# Patient Record
Sex: Female | Born: 1950
Health system: Southern US, Community
[De-identification: ages and names within clinical notes are randomized; demographics above are authoritative.]

## PROBLEM LIST (undated history)

## (undated) DIAGNOSIS — M1990 Unspecified osteoarthritis, unspecified site: Secondary | ICD-10-CM

## (undated) DIAGNOSIS — F419 Anxiety disorder, unspecified: Secondary | ICD-10-CM

## (undated) DIAGNOSIS — E039 Hypothyroidism, unspecified: Secondary | ICD-10-CM

## (undated) DIAGNOSIS — K219 Gastro-esophageal reflux disease without esophagitis: Secondary | ICD-10-CM

## (undated) DIAGNOSIS — H269 Unspecified cataract: Secondary | ICD-10-CM

## (undated) DIAGNOSIS — E785 Hyperlipidemia, unspecified: Secondary | ICD-10-CM

## (undated) DIAGNOSIS — T7840XA Allergy, unspecified, initial encounter: Secondary | ICD-10-CM

## (undated) DIAGNOSIS — I10 Essential (primary) hypertension: Secondary | ICD-10-CM

## (undated) DIAGNOSIS — C4492 Squamous cell carcinoma of skin, unspecified: Secondary | ICD-10-CM

## (undated) DIAGNOSIS — C4491 Basal cell carcinoma of skin, unspecified: Secondary | ICD-10-CM

## (undated) HISTORY — DX: Allergy, unspecified, initial encounter: T78.40XA

## (undated) HISTORY — DX: Gastro-esophageal reflux disease without esophagitis: K21.9

## (undated) HISTORY — PX: TUBAL LIGATION: SHX77

## (undated) HISTORY — PX: EYE SURGERY: SHX253

## (undated) HISTORY — PX: KNEE ARTHROSCOPY: SUR90

## (undated) HISTORY — DX: Hypothyroidism, unspecified: E03.9

## (undated) HISTORY — PX: OTHER SURGICAL HISTORY: SHX169

## (undated) HISTORY — PX: ABDOMINAL HYSTERECTOMY: SHX81

## (undated) HISTORY — DX: Hyperlipidemia, unspecified: E78.5

## (undated) HISTORY — PX: TONSILLECTOMY: SUR1361

## (undated) HISTORY — PX: COLONOSCOPY: SHX174

## (undated) HISTORY — DX: Anxiety disorder, unspecified: F41.9

## (undated) HISTORY — DX: Unspecified osteoarthritis, unspecified site: M19.90

## (undated) HISTORY — PX: PARTIAL HYSTERECTOMY: SHX80

## (undated) HISTORY — DX: Unspecified cataract: H26.9

## (undated) HISTORY — PX: POLYPECTOMY: SHX149

---
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
## 1994-10-14 DIAGNOSIS — C4491 Basal cell carcinoma of skin, unspecified: Secondary | ICD-10-CM

## 1994-10-14 HISTORY — DX: Basal cell carcinoma of skin, unspecified: C44.91

## 1999-04-30 ENCOUNTER — Encounter: Payer: Self-pay | Admitting: Obstetrics and Gynecology

## 1999-04-30 ENCOUNTER — Encounter: Admission: RE | Admit: 1999-04-30 | Discharge: 1999-04-30 | Payer: Self-pay | Admitting: Obstetrics and Gynecology

## 1999-07-25 ENCOUNTER — Other Ambulatory Visit: Admission: RE | Admit: 1999-07-25 | Discharge: 1999-07-25 | Payer: Self-pay | Admitting: Obstetrics and Gynecology

## 2000-06-09 ENCOUNTER — Encounter: Admission: RE | Admit: 2000-06-09 | Discharge: 2000-06-09 | Payer: Self-pay | Admitting: Obstetrics and Gynecology

## 2000-06-09 ENCOUNTER — Encounter: Payer: Self-pay | Admitting: Obstetrics and Gynecology

## 2000-10-25 ENCOUNTER — Other Ambulatory Visit: Admission: RE | Admit: 2000-10-25 | Discharge: 2000-10-25 | Payer: Self-pay | Admitting: Obstetrics and Gynecology

## 2001-06-17 ENCOUNTER — Encounter: Admission: RE | Admit: 2001-06-17 | Discharge: 2001-06-17 | Payer: Self-pay | Admitting: Obstetrics and Gynecology

## 2001-06-17 ENCOUNTER — Encounter: Payer: Self-pay | Admitting: Obstetrics and Gynecology

## 2002-09-20 ENCOUNTER — Encounter: Payer: Self-pay | Admitting: Obstetrics and Gynecology

## 2002-09-20 ENCOUNTER — Encounter: Admission: RE | Admit: 2002-09-20 | Discharge: 2002-09-20 | Payer: Self-pay | Admitting: Obstetrics and Gynecology

## 2003-04-03 ENCOUNTER — Encounter: Admission: RE | Admit: 2003-04-03 | Discharge: 2003-04-03 | Payer: Self-pay | Admitting: Family Medicine

## 2003-10-10 ENCOUNTER — Ambulatory Visit (HOSPITAL_COMMUNITY): Admission: RE | Admit: 2003-10-10 | Discharge: 2003-10-10 | Payer: Self-pay | Admitting: Obstetrics and Gynecology

## 2004-10-10 ENCOUNTER — Ambulatory Visit (HOSPITAL_COMMUNITY): Admission: RE | Admit: 2004-10-10 | Discharge: 2004-10-10 | Payer: Self-pay | Admitting: Family Medicine

## 2004-10-13 ENCOUNTER — Encounter: Admission: RE | Admit: 2004-10-13 | Discharge: 2004-10-13 | Payer: Self-pay | Admitting: Family Medicine

## 2005-12-01 ENCOUNTER — Ambulatory Visit (HOSPITAL_COMMUNITY): Admission: RE | Admit: 2005-12-01 | Discharge: 2005-12-01 | Payer: Self-pay | Admitting: Obstetrics & Gynecology

## 2007-02-23 ENCOUNTER — Ambulatory Visit (HOSPITAL_COMMUNITY): Admission: RE | Admit: 2007-02-23 | Discharge: 2007-02-23 | Payer: Self-pay | Admitting: Family Medicine

## 2007-08-18 ENCOUNTER — Ambulatory Visit: Payer: Self-pay | Admitting: Gastroenterology

## 2007-09-01 ENCOUNTER — Ambulatory Visit: Payer: Self-pay | Admitting: Gastroenterology

## 2007-09-01 ENCOUNTER — Encounter: Payer: Self-pay | Admitting: Gastroenterology

## 2008-04-12 ENCOUNTER — Ambulatory Visit (HOSPITAL_COMMUNITY): Admission: RE | Admit: 2008-04-12 | Discharge: 2008-04-12 | Payer: Self-pay | Admitting: Obstetrics and Gynecology

## 2009-02-15 ENCOUNTER — Encounter: Admission: RE | Admit: 2009-02-15 | Discharge: 2009-02-15 | Payer: Self-pay | Admitting: Emergency Medicine

## 2009-04-29 ENCOUNTER — Ambulatory Visit (HOSPITAL_COMMUNITY): Admission: RE | Admit: 2009-04-29 | Discharge: 2009-04-29 | Payer: Self-pay | Admitting: Family Medicine

## 2010-06-24 ENCOUNTER — Ambulatory Visit (HOSPITAL_COMMUNITY)
Admission: RE | Admit: 2010-06-24 | Discharge: 2010-06-24 | Payer: Self-pay | Source: Home / Self Care | Attending: Obstetrics and Gynecology | Admitting: Obstetrics and Gynecology

## 2010-06-28 ENCOUNTER — Encounter: Payer: Self-pay | Admitting: Obstetrics and Gynecology

## 2010-06-29 ENCOUNTER — Encounter: Payer: Self-pay | Admitting: Family Medicine

## 2010-07-30 DIAGNOSIS — R079 Chest pain, unspecified: Secondary | ICD-10-CM

## 2011-06-23 ENCOUNTER — Other Ambulatory Visit (HOSPITAL_COMMUNITY): Payer: Self-pay | Admitting: Obstetrics and Gynecology

## 2011-06-23 DIAGNOSIS — Z1231 Encounter for screening mammogram for malignant neoplasm of breast: Secondary | ICD-10-CM

## 2011-07-22 ENCOUNTER — Ambulatory Visit (HOSPITAL_COMMUNITY): Payer: Self-pay

## 2011-07-28 ENCOUNTER — Ambulatory Visit (HOSPITAL_COMMUNITY)
Admission: RE | Admit: 2011-07-28 | Discharge: 2011-07-28 | Disposition: A | Discharge status: Home / Self Care | Payer: BC Managed Care – PPO | Source: Ambulatory Visit | Attending: Obstetrics and Gynecology | Admitting: Obstetrics and Gynecology

## 2011-07-28 DIAGNOSIS — Z1231 Encounter for screening mammogram for malignant neoplasm of breast: Secondary | ICD-10-CM | POA: Insufficient documentation

## 2011-12-07 ENCOUNTER — Other Ambulatory Visit: Payer: Self-pay | Admitting: Dermatology

## 2011-12-07 DIAGNOSIS — C4492 Squamous cell carcinoma of skin, unspecified: Secondary | ICD-10-CM

## 2011-12-07 HISTORY — DX: Squamous cell carcinoma of skin, unspecified: C44.92

## 2012-09-01 ENCOUNTER — Other Ambulatory Visit: Payer: Self-pay

## 2012-09-21 ENCOUNTER — Encounter: Payer: Self-pay | Admitting: Gastroenterology

## 2012-10-05 ENCOUNTER — Other Ambulatory Visit (HOSPITAL_COMMUNITY): Payer: Self-pay | Admitting: Obstetrics and Gynecology

## 2012-10-05 DIAGNOSIS — Z1231 Encounter for screening mammogram for malignant neoplasm of breast: Secondary | ICD-10-CM

## 2012-10-06 ENCOUNTER — Ambulatory Visit (HOSPITAL_COMMUNITY)
Admission: RE | Admit: 2012-10-06 | Discharge: 2012-10-06 | Disposition: A | Discharge status: Home / Self Care | Payer: BC Managed Care – PPO | Source: Ambulatory Visit | Attending: Obstetrics and Gynecology | Admitting: Obstetrics and Gynecology

## 2012-10-06 DIAGNOSIS — Z1231 Encounter for screening mammogram for malignant neoplasm of breast: Secondary | ICD-10-CM | POA: Insufficient documentation

## 2013-03-30 ENCOUNTER — Encounter: Payer: Self-pay | Admitting: Gastroenterology

## 2013-06-15 ENCOUNTER — Encounter: Payer: BC Managed Care – PPO | Admitting: Gastroenterology

## 2013-08-11 ENCOUNTER — Ambulatory Visit (AMBULATORY_SURGERY_CENTER): Payer: Self-pay

## 2013-08-11 VITALS — Ht 62.0 in | Wt 180.0 lb

## 2013-08-11 DIAGNOSIS — Z860100 Personal history of colon polyps, unspecified: Secondary | ICD-10-CM

## 2013-08-11 MED ORDER — MOVIPREP 100 G PO SOLR: 1.0000 | Freq: Once | ORAL | Status: DC

## 2013-08-24 ENCOUNTER — Encounter: Payer: Self-pay | Admitting: Gastroenterology

## 2013-08-25 ENCOUNTER — Encounter: Payer: BC Managed Care – PPO | Admitting: Gastroenterology

## 2013-09-06 ENCOUNTER — Other Ambulatory Visit (HOSPITAL_COMMUNITY): Payer: Self-pay | Admitting: Obstetrics and Gynecology

## 2013-09-06 DIAGNOSIS — Z1231 Encounter for screening mammogram for malignant neoplasm of breast: Secondary | ICD-10-CM

## 2013-10-04 ENCOUNTER — Encounter: Payer: BC Managed Care – PPO | Admitting: Gastroenterology

## 2013-10-09 ENCOUNTER — Ambulatory Visit (HOSPITAL_COMMUNITY)
Admission: RE | Admit: 2013-10-09 | Discharge: 2013-10-09 | Disposition: A | Discharge status: Home / Self Care | Payer: BC Managed Care – PPO | Source: Ambulatory Visit | Attending: Obstetrics and Gynecology | Admitting: Obstetrics and Gynecology

## 2013-10-09 DIAGNOSIS — Z1231 Encounter for screening mammogram for malignant neoplasm of breast: Secondary | ICD-10-CM | POA: Insufficient documentation

## 2013-10-20 ENCOUNTER — Ambulatory Visit (AMBULATORY_SURGERY_CENTER): Payer: BC Managed Care – PPO | Admitting: Gastroenterology

## 2013-10-20 ENCOUNTER — Encounter: Payer: Self-pay | Admitting: Gastroenterology

## 2013-10-20 VITALS — BP 144/97 | HR 69 | Temp 97.3°F | Resp 22 | Ht 62.0 in | Wt 180.0 lb

## 2013-10-20 DIAGNOSIS — K5730 Diverticulosis of large intestine without perforation or abscess without bleeding: Secondary | ICD-10-CM

## 2013-10-20 DIAGNOSIS — K648 Other hemorrhoids: Secondary | ICD-10-CM

## 2013-10-20 DIAGNOSIS — Z86010 Personal history of colonic polyps: Secondary | ICD-10-CM

## 2013-10-20 DIAGNOSIS — Z800 Family history of malignant neoplasm of digestive organs: Secondary | ICD-10-CM

## 2013-10-20 MED ORDER — SODIUM CHLORIDE 0.9 % IV SOLN: 500.0000 mL | INTRAVENOUS | Status: DC

## 2013-10-20 NOTE — Op Note (Signed)
[place]  [address] [place], [id number]   COLONOSCOPY PROCEDURE REPORT  PATIENT: Lynn [name], Lynn [name]  MR#: [id number] BIRTHDATE: [birth year] , 63  yrs. old GENDER: Female ENDOSCOPIST: Inda Castle, MD REFERRED [id number] Redmond Pulling, MD PROCEDURE DATE:  10/20/2013 PROCEDURE:   Colonoscopy, diagnostic First Screening Colonoscopy - Avg.  risk and is 50 yrs.  old or older - No.  Prior Negative Screening - Now for repeat screening. N/A  History of Adenoma - Now for follow-up colonoscopy & has been > or = to 3 yrs.  Yes hx of adenoma.  Has been 3 or more years since last colonoscopy.  Polyps Removed Today? No.  Recommend repeat exam, <10 yrs? Yes.  High risk (family or personal hx). ASA CLASS:   Class II INDICATIONS:Patient's immediate family history of colon cancer and Patient's personal history of colon polyps. 2009 MEDICATIONS: MAC sedation, administered by CRNA and Propofol (Diprivan) 220 mg IV  DESCRIPTION OF PROCEDURE:   After the risks benefits and alternatives of the procedure were thoroughly explained, informed consent was obtained.  A digital rectal exam revealed no abnormalities of the rectum.   The LB [id number]  endoscope was introduced through the anus and advanced to the cecum, which was identified by both the appendix and ileocecal valve. No adverse events experienced.   The quality of the prep was excellent using Suprep  The instrument was then slowly withdrawn as the colon was fully examined.      COLON FINDINGS: Moderate diverticulosis was noted in the sigmoid colon.   Internal hemorrhoids were found.   The colon was otherwise normal.  There was no diverticulosis, inflammation, polyps or cancers unless previously stated.  Retroflexed views revealed no abnormalities. The time to cecum=minutes 0 seconds.  Withdrawal time=6 minutes 55 seconds.  The scope was withdrawn and the procedure completed. COMPLICATIONS: There were no  complications.  ENDOSCOPIC IMPRESSION: 1.   Moderate diverticulosis was noted in the sigmoid colon 2.   Internal hemorrhoids 3.   The colon was otherwise normal  RECOMMENDATIONS: Given your significant family history of colon cancer, you should have a repeat colonoscopy in 5 years   eSigned:  Inda Castle, MD 10/20/2013 1:37 PM   cc:   PATIENT NAME:  Lynn [name], Lynn [name] MR#: [id number]

## 2013-10-20 NOTE — Patient Instructions (Signed)
YOU HAD AN ENDOSCOPIC PROCEDURE TODAY AT [facility]: Refer to the procedure report that was given to you for any specific questions about what was found during the examination.  If the procedure report does not answer your questions, please call your gastroenterologist to clarify.  If you requested that your care partner not be given the details of your procedure findings, then the procedure report has been included in a sealed envelope for you to review at your convenience later.  YOU SHOULD EXPECT: Some feelings of bloating in the abdomen. Passage of more gas than usual.  Walking can help get rid of the air that was put into your GI tract during the procedure and reduce the bloating. If you had a lower endoscopy (such as a colonoscopy or flexible sigmoidoscopy) you may notice spotting of blood in your stool or on the toilet paper. If you underwent a bowel prep for your procedure, then you may not have a normal bowel movement for a few days.  DIET: Your first meal following the procedure should be a light meal and then it is ok to progress to your normal diet.  A half-sandwich or bowl of soup is an example of a good first meal.  Heavy or fried foods are harder to digest and may make you feel nauseous or bloated.  Likewise meals heavy in dairy and vegetables can cause extra gas to form and this can also increase the bloating.  Drink plenty of fluids but you should avoid alcoholic beverages for 24 hours.  ACTIVITY: Your care partner should take you home directly after the procedure.  You should plan to take it easy, moving slowly for the rest of the day.  You can resume normal activity the day after the procedure however you should NOT DRIVE or use heavy machinery for 24 hours (because of the sedation medicines used during the test).    SYMPTOMS TO REPORT IMMEDIATELY: A gastroenterologist can be reached at any hour.  During normal business hours, 8:30 AM to 5:00 PM Monday through Friday,  call [phone].  After hours and on weekends, please call the GI answering service at [phone] who will take a message and have the physician on call contact you.   Following lower endoscopy (colonoscopy or flexible sigmoidoscopy):  Excessive amounts of blood in the stool  Significant tenderness or worsening of abdominal pains  Swelling of the abdomen that is new, acute  Fever of 100F or higher \ FOLLOW UP: If any biopsies were taken you will be contacted by phone or by letter within the next 1-3 weeks.  Call your gastroenterologist if you have not heard about the biopsies in 3 weeks.  Our staff will call the home number listed on your records the next business day following your procedure to check on you and address any questions or concerns that you may have at that time regarding the information given to you following your procedure. This is a courtesy call and so if there is no answer at the home number and we have not heard from you through the emergency physician on call, we will assume that you have returned to your regular daily activities without incident.  SIGNATURES/CONFIDENTIALITY: You and/or your care partner have signed paperwork which will be entered into your electronic medical record.  These signatures attest to the fact that that the information above on your After Visit Summary has been reviewed and is understood.  Full responsibility of the confidentiality of this  discharge information lies with you and/or your care-partner.  Hemorrhoid and diverticulosis information given.  Repeat colonoscopy in 5 years.

## 2013-10-20 NOTE — Progress Notes (Signed)
Pt stable to RR 

## 2013-10-23 ENCOUNTER — Telehealth: Payer: Self-pay | Admitting: *Deleted

## 2013-10-23 NOTE — Telephone Encounter (Signed)
  Follow up Call-  Call back number 10/20/2013  Post procedure Call Back phone  # [phone]  Permission to leave phone message Yes     Patient questions:  Message left to call us if necessary.

## 2014-09-19 ENCOUNTER — Other Ambulatory Visit (HOSPITAL_COMMUNITY): Payer: Self-pay | Admitting: Obstetrics and Gynecology

## 2014-09-19 DIAGNOSIS — Z1231 Encounter for screening mammogram for malignant neoplasm of breast: Secondary | ICD-10-CM

## 2014-10-11 ENCOUNTER — Ambulatory Visit (HOSPITAL_COMMUNITY)
Admission: RE | Admit: 2014-10-11 | Discharge: 2014-10-11 | Disposition: A | Discharge status: Home / Self Care | Payer: BLUE CROSS/BLUE SHIELD | Source: Ambulatory Visit | Attending: Obstetrics and Gynecology | Admitting: Obstetrics and Gynecology

## 2014-10-11 DIAGNOSIS — Z1231 Encounter for screening mammogram for malignant neoplasm of breast: Secondary | ICD-10-CM | POA: Diagnosis not present

## 2015-02-28 ENCOUNTER — Other Ambulatory Visit: Payer: Self-pay | Admitting: Unknown Physician Specialty

## 2015-02-28 DIAGNOSIS — B999 Unspecified infectious disease: Secondary | ICD-10-CM

## 2015-03-06 ENCOUNTER — Ambulatory Visit
Admission: RE | Admit: 2015-03-06 | Discharge: 2015-03-06 | Disposition: A | Discharge status: Home / Self Care | Payer: BLUE CROSS/BLUE SHIELD | Source: Ambulatory Visit | Attending: Unknown Physician Specialty | Admitting: Unknown Physician Specialty

## 2015-03-06 DIAGNOSIS — B999 Unspecified infectious disease: Secondary | ICD-10-CM

## 2015-04-16 LAB — BASIC METABOLIC PANEL
BUN: 10 mg/dL (ref 4–21)
CREATININE: 0.9 mg/dL (ref <=1.1)
GLUCOSE: 87 mg/dL
Potassium: 3.3 mmol/L — AB (ref 3.4–5.3)
SODIUM: 145 mmol/L (ref 137–147)

## 2015-04-16 LAB — CBC AND DIFFERENTIAL
HCT: 41 % (ref 36–46)
Hemoglobin: 14 g/dL (ref 12.0–16.0)
NEUTROS ABS: 3 /uL
PLATELETS: 179 10*3/uL (ref 150–399)
WBC: 4.9 10^3/mL

## 2015-04-16 LAB — TSH: TSH: 1.98 u[IU]/mL (ref <=5.90)

## 2015-04-16 LAB — LIPID PANEL
Cholesterol: 175 mg/dL (ref 0–200)
HDL: 50 mg/dL (ref 35–70)
LDL CALC: 27 mg/dL
TRIGLYCERIDES: 137 mg/dL (ref 40–160)

## 2015-04-16 LAB — HEPATIC FUNCTION PANEL
ALK PHOS: 60 U/L (ref 25–125)
AST: 21 U/L (ref 13–35)

## 2015-04-23 LAB — HM DEXA SCAN: HM DEXA SCAN: NORMAL

## 2015-04-26 LAB — HEPATIC FUNCTION PANEL: ALT: 16 U/L (ref 7–35)

## 2015-08-14 DIAGNOSIS — I8312 Varicose veins of left lower extremity with inflammation: Secondary | ICD-10-CM | POA: Diagnosis not present

## 2015-08-14 DIAGNOSIS — I8311 Varicose veins of right lower extremity with inflammation: Secondary | ICD-10-CM | POA: Diagnosis not present

## 2015-09-02 ENCOUNTER — Other Ambulatory Visit: Payer: Self-pay

## 2015-09-02 DIAGNOSIS — Z1231 Encounter for screening mammogram for malignant neoplasm of breast: Secondary | ICD-10-CM

## 2015-09-03 DIAGNOSIS — L719 Rosacea, unspecified: Secondary | ICD-10-CM | POA: Diagnosis not present

## 2015-09-03 DIAGNOSIS — D239 Other benign neoplasm of skin, unspecified: Secondary | ICD-10-CM | POA: Diagnosis not present

## 2015-09-03 DIAGNOSIS — B079 Viral wart, unspecified: Secondary | ICD-10-CM | POA: Diagnosis not present

## 2015-09-09 DIAGNOSIS — I8312 Varicose veins of left lower extremity with inflammation: Secondary | ICD-10-CM | POA: Diagnosis not present

## 2015-09-09 DIAGNOSIS — I8311 Varicose veins of right lower extremity with inflammation: Secondary | ICD-10-CM | POA: Diagnosis not present

## 2015-09-25 DIAGNOSIS — I8312 Varicose veins of left lower extremity with inflammation: Secondary | ICD-10-CM | POA: Diagnosis not present

## 2015-09-25 DIAGNOSIS — I8311 Varicose veins of right lower extremity with inflammation: Secondary | ICD-10-CM | POA: Diagnosis not present

## 2015-10-14 ENCOUNTER — Ambulatory Visit
Admission: RE | Admit: 2015-10-14 | Discharge: 2015-10-14 | Disposition: A | Discharge status: Home / Self Care | Payer: Medicare Other | Source: Ambulatory Visit

## 2015-10-14 DIAGNOSIS — Z1231 Encounter for screening mammogram for malignant neoplasm of breast: Secondary | ICD-10-CM

## 2015-10-18 ENCOUNTER — Encounter: Payer: Self-pay | Admitting: General Practice

## 2015-10-24 ENCOUNTER — Ambulatory Visit (INDEPENDENT_AMBULATORY_CARE_PROVIDER_SITE_OTHER): Payer: Medicare Other | Admitting: Family Medicine

## 2015-10-24 ENCOUNTER — Encounter: Payer: Self-pay | Admitting: Family Medicine

## 2015-10-24 VITALS — BP 124/80 | HR 86 | Temp 98.0°F | Resp 16 | Ht 62.0 in | Wt 196.1 lb

## 2015-10-24 DIAGNOSIS — E038 Other specified hypothyroidism: Secondary | ICD-10-CM

## 2015-10-24 DIAGNOSIS — Z23 Encounter for immunization: Secondary | ICD-10-CM | POA: Diagnosis not present

## 2015-10-24 DIAGNOSIS — E785 Hyperlipidemia, unspecified: Secondary | ICD-10-CM | POA: Diagnosis not present

## 2015-10-24 DIAGNOSIS — E039 Hypothyroidism, unspecified: Secondary | ICD-10-CM | POA: Insufficient documentation

## 2015-10-24 DIAGNOSIS — E669 Obesity, unspecified: Secondary | ICD-10-CM | POA: Diagnosis not present

## 2015-10-24 LAB — CBC WITH DIFFERENTIAL/PLATELET
BASOS ABS: 0 10*3/uL (ref 0.0–0.1)
BASOS PCT: 0.8 % (ref 0.0–3.0)
EOS ABS: 0.1 10*3/uL (ref 0.0–0.7)
Eosinophils Relative: 1.9 % (ref 0.0–5.0)
HCT: 45.4 % (ref 36.0–46.0)
HEMOGLOBIN: 15.3 g/dL — AB (ref 12.0–15.0)
Lymphocytes Relative: 27.5 % (ref 12.0–46.0)
Lymphs Abs: 1.8 10*3/uL (ref 0.7–4.0)
MCHC: 33.6 g/dL (ref 30.0–36.0)
MCV: 85.8 fl (ref 78.0–100.0)
MONO ABS: 0.5 10*3/uL (ref 0.1–1.0)
Monocytes Relative: 8.3 % (ref 3.0–12.0)
Neutro Abs: 4.1 10*3/uL (ref 1.4–7.7)
Neutrophils Relative %: 61.5 % (ref 43.0–77.0)
Platelets: 200 10*3/uL (ref 150.0–400.0)
RBC: 5.3 Mil/uL — ABNORMAL HIGH (ref 3.87–5.11)
RDW: 12.6 % (ref 11.5–15.5)
WBC: 6.6 10*3/uL (ref 4.0–10.5)

## 2015-10-24 LAB — BASIC METABOLIC PANEL
BUN: 14 mg/dL (ref 6–23)
CHLORIDE: 104 meq/L (ref 96–112)
CO2: 31 meq/L (ref 19–32)
CREATININE: 1.03 mg/dL (ref 0.40–1.20)
Calcium: 9.2 mg/dL (ref 8.4–10.5)
GFR: 57.11 mL/min — ABNORMAL LOW (ref >60.00)
GLUCOSE: 85 mg/dL (ref 70–99)
Potassium: 3.6 mEq/L (ref 3.5–5.1)
Sodium: 144 mEq/L (ref 135–145)

## 2015-10-24 LAB — LIPID PANEL
Cholesterol: 198 mg/dL (ref 0–200)
HDL: 46.1 mg/dL (ref >39.00)
LDL Cholesterol: 119 mg/dL — ABNORMAL HIGH (ref 0–99)
NonHDL: 152.38
TRIGLYCERIDES: 166 mg/dL — AB (ref 0.0–149.0)
Total CHOL/HDL Ratio: 4
VLDL: 33.2 mg/dL (ref 0.0–40.0)

## 2015-10-24 LAB — HEPATIC FUNCTION PANEL
ALBUMIN: 4.5 g/dL (ref 3.5–5.2)
ALT: 17 U/L (ref 0–35)
AST: 19 U/L (ref 0–37)
Alkaline Phosphatase: 69 U/L (ref 39–117)
Bilirubin, Direct: 0.2 mg/dL (ref 0.0–0.3)
TOTAL PROTEIN: 6.7 g/dL (ref 6.0–8.3)
Total Bilirubin: 1 mg/dL (ref 0.2–1.2)

## 2015-10-24 LAB — TSH: TSH: 1.94 u[IU]/mL (ref 0.35–4.50)

## 2015-10-24 NOTE — Assessment & Plan Note (Signed)
New to provider, ongoing for pt.  Tolerating statin w/o difficulty.  Check labs.  Adjust meds prn  

## 2015-10-24 NOTE — Assessment & Plan Note (Signed)
New to provider, ongoing for pt.  She is now working on [employer] and regular exercise now that she has retired.  Applauded these efforts.  Check labs to risk stratify.  Will follow.

## 2015-10-24 NOTE — Progress Notes (Signed)
Pre visit review using our clinic review tool, if applicable. No additional management support is needed unless otherwise documented below in the visit note. 

## 2015-10-24 NOTE — Assessment & Plan Note (Signed)
New to provider, ongoing for pt.  She has some sxs- such as fatigue- that could be attributed to thyroid.  Check TSH level and adjust dose prn.  Pt expressed understanding and is in agreement w/ plan.

## 2015-10-24 NOTE — Patient Instructions (Signed)
Schedule your complete physical in 6 months We'll notify you of your lab results and make any changes if needed Continue to work on healthy diet and regular exercise- you can do it! Call with any questions or concerns Welcome!  We're glad to have you! Have a great summer!!!

## 2015-10-24 NOTE — Progress Notes (Signed)
   Subjective:    Patient ID: Lynn [name], female    DOB: [birth year], 65 y.o.   MRN: [id number]  HPI New to establish.  Previous MD- Dianna Rossetti on site providers.  UTD on colonoscopy, UTD on mammo, no need for pap.  Has never had Prevnar  Hyperlipidemia- chronic problem, on Simvastatin.  No CP, SOB, HAs, visual changes, abd pain, N/V, myalgias.  Hypothyroid- chronic problem, on Levothyroxine.  Some ongoing fatigue.  No changes to skin/hair/nails.  Denies constipation or diarrhea.  Obesity- ongoing issue for pt, BMI is now 35.87.  Pt just started exercising and has changed diet habits.   Review of Systems For ROS see HPI     Objective:   Physical Exam  Constitutional: She is oriented to person, place, and time. She appears well-developed and well-nourished. No distress.  obese  HENT:  Head: Normocephalic and atraumatic.  Eyes: Conjunctivae and EOM are normal. Pupils are equal, round, and reactive to light.  Neck: Normal range of motion. Neck supple. No thyromegaly present.  Cardiovascular: Normal rate, regular rhythm, normal heart sounds and intact distal pulses.   No murmur heard. Pulmonary/Chest: Effort normal and breath sounds normal. No respiratory distress.  Abdominal: Soft. She exhibits no distension. There is no tenderness.  Musculoskeletal: She exhibits no edema.  Lymphadenopathy:    She has no cervical adenopathy.  Neurological: She is alert and oriented to person, place, and time.  Skin: Skin is warm and dry.  Psychiatric: She has a normal mood and affect. Her behavior is normal.  Vitals reviewed.         Assessment & Plan:

## 2015-10-25 ENCOUNTER — Encounter: Payer: Self-pay | Admitting: General Practice

## 2015-11-20 DIAGNOSIS — L570 Actinic keratosis: Secondary | ICD-10-CM | POA: Diagnosis not present

## 2015-11-20 DIAGNOSIS — L719 Rosacea, unspecified: Secondary | ICD-10-CM | POA: Diagnosis not present

## 2015-12-11 DIAGNOSIS — M1711 Unilateral primary osteoarthritis, right knee: Secondary | ICD-10-CM | POA: Diagnosis not present

## 2015-12-18 DIAGNOSIS — M1711 Unilateral primary osteoarthritis, right knee: Secondary | ICD-10-CM | POA: Diagnosis not present

## 2015-12-25 DIAGNOSIS — M1711 Unilateral primary osteoarthritis, right knee: Secondary | ICD-10-CM | POA: Diagnosis not present

## 2016-01-28 DIAGNOSIS — Z029 Encounter for administrative examinations, unspecified: Secondary | ICD-10-CM | POA: Diagnosis not present

## 2016-04-02 ENCOUNTER — Ambulatory Visit (INDEPENDENT_AMBULATORY_CARE_PROVIDER_SITE_OTHER): Payer: Medicare Other

## 2016-04-02 DIAGNOSIS — Z23 Encounter for immunization: Secondary | ICD-10-CM | POA: Diagnosis not present

## 2016-04-22 ENCOUNTER — Encounter: Payer: Self-pay | Admitting: Family Medicine

## 2016-04-22 ENCOUNTER — Ambulatory Visit (INDEPENDENT_AMBULATORY_CARE_PROVIDER_SITE_OTHER): Payer: Medicare Other | Admitting: Family Medicine

## 2016-04-22 VITALS — BP 120/78 | HR 86 | Temp 98.1°F | Resp 16 | Ht 62.0 in | Wt 192.2 lb

## 2016-04-22 DIAGNOSIS — E038 Other specified hypothyroidism: Secondary | ICD-10-CM | POA: Diagnosis not present

## 2016-04-22 DIAGNOSIS — Z0000 Encounter for general adult medical examination without abnormal findings: Secondary | ICD-10-CM | POA: Diagnosis not present

## 2016-04-22 DIAGNOSIS — E785 Hyperlipidemia, unspecified: Secondary | ICD-10-CM | POA: Diagnosis not present

## 2016-04-22 LAB — LIPID PANEL
Cholesterol: 219 mg/dL — ABNORMAL HIGH (ref 0–200)
HDL: 48.5 mg/dL (ref >39.00)
LDL CALC: 139 mg/dL — AB (ref 0–99)
NonHDL: 170.94
Total CHOL/HDL Ratio: 5
Triglycerides: 161 mg/dL — ABNORMAL HIGH (ref 0.0–149.0)
VLDL: 32.2 mg/dL (ref 0.0–40.0)

## 2016-04-22 LAB — BASIC METABOLIC PANEL
BUN: 11 mg/dL (ref 6–23)
CO2: 32 meq/L (ref 19–32)
Calcium: 9.4 mg/dL (ref 8.4–10.5)
Chloride: 103 mEq/L (ref 96–112)
Creatinine, Ser: 1.01 mg/dL (ref 0.40–1.20)
GFR: 58.33 mL/min — AB (ref >60.00)
GLUCOSE: 94 mg/dL (ref 70–99)
POTASSIUM: 3.8 meq/L (ref 3.5–5.1)
SODIUM: 143 meq/L (ref 135–145)

## 2016-04-22 LAB — HEPATIC FUNCTION PANEL
ALK PHOS: 71 U/L (ref 39–117)
ALT: 15 U/L (ref 0–35)
AST: 14 U/L (ref 0–37)
Albumin: 4.2 g/dL (ref 3.5–5.2)
BILIRUBIN DIRECT: 0.2 mg/dL (ref 0.0–0.3)
BILIRUBIN TOTAL: 0.9 mg/dL (ref 0.2–1.2)
TOTAL PROTEIN: 6.5 g/dL (ref 6.0–8.3)

## 2016-04-22 LAB — CBC WITH DIFFERENTIAL/PLATELET
BASOS ABS: 0.1 10*3/uL (ref 0.0–0.1)
BASOS PCT: 1 % (ref 0.0–3.0)
EOS PCT: 1.2 % (ref 0.0–5.0)
Eosinophils Absolute: 0.1 10*3/uL (ref 0.0–0.7)
HCT: 44.1 % (ref 36.0–46.0)
Hemoglobin: 14.8 g/dL (ref 12.0–15.0)
LYMPHS ABS: 1.5 10*3/uL (ref 0.7–4.0)
Lymphocytes Relative: 24.9 % (ref 12.0–46.0)
MCHC: 33.6 g/dL (ref 30.0–36.0)
MCV: 85.8 fl (ref 78.0–100.0)
MONOS PCT: 8.9 % (ref 3.0–12.0)
Monocytes Absolute: 0.6 10*3/uL (ref 0.1–1.0)
NEUTROS ABS: 4 10*3/uL (ref 1.4–7.7)
Neutrophils Relative %: 64 % (ref 43.0–77.0)
PLATELETS: 195 10*3/uL (ref 150.0–400.0)
RBC: 5.13 Mil/uL — ABNORMAL HIGH (ref 3.87–5.11)
RDW: 12.7 % (ref 11.5–15.5)
WBC: 6.2 10*3/uL (ref 4.0–10.5)

## 2016-04-22 LAB — TSH: TSH: 2.59 u[IU]/mL (ref 0.35–4.50)

## 2016-04-22 NOTE — Assessment & Plan Note (Signed)
Chronic problem.  Tolerating statin w/o difficulty.  Stressed need for healthy diet and regular exercise.  Check labs.  Adjust meds prn  

## 2016-04-22 NOTE — Progress Notes (Signed)
   Subjective:    Patient ID: Lynn [name], female    DOB: 1950-06-26, 65 y.o.   MRN: [id number]  HPI Here today for CPE.  Risk Factors: Hypothyroid- chronic problem, on Levothyroxine.  Denies fatigue, changes to bowel/bladder habits, changes to skin/hair/nails Hyperlipidemia- chronic problem, on Simvastatin.  Denies CP, SOB, abd pain, N/V, myalgias Physical Activity: exercising regularly- 3-4x/week for 30-40 minutes Fall Risk: low Depression: denies Hearing: normal to conversational tones and whispered voice ADL's: independent Cognitive: normal linear thought process, memory and attention intact Home Safety: safe at home Height, Weight, BMI, Visual Acuity: see vitals, vision corrected to 20/20 w/ glasses Counseling: UTD on colonoscopy (due 2025), mammo (due May 2018), immunizations Care team reviewed and updated w/ pt Labs Ordered: See A&P Care Plan: See A&P    Review of Systems Patient reports no vision/ hearing changes, adenopathy,fever, weight change,  persistant/recurrent hoarseness , swallowing issues, chest pain, palpitations, edema, persistant/recurrent cough, hemoptysis, dyspnea (rest/exertional/paroxysmal nocturnal), gastrointestinal bleeding (melena, rectal bleeding), abdominal pain, significant heartburn, bowel changes, GU symptoms (dysuria, hematuria, incontinence), Gyn symptoms (abnormal  bleeding, pain),  syncope, focal weakness, memory loss, numbness & tingling, skin/hair/nail changes, abnormal bruising or bleeding, anxiety, or depression.     Objective:   Physical Exam General Appearance:    Alert, cooperative, no distress, appears stated age  Head:    Normocephalic, without obvious abnormality, atraumatic  Eyes:    PERRL, conjunctiva/corneas clear, EOM's intact, fundi    benign, both eyes  Ears:    Normal TM's and external ear canals, both ears  Nose:   Nares normal, septum midline, mucosa normal, no drainage    or sinus tenderness  Throat:   Lips, mucosa, and  tongue normal; teeth and gums normal  Neck:   Supple, symmetrical, trachea midline, no adenopathy;    Thyroid: no enlargement/tenderness/nodules  Back:     Symmetric, no curvature, ROM normal, no CVA tenderness  Lungs:     Clear to auscultation bilaterally, respirations unlabored  Chest Wall:    No tenderness or deformity   Heart:    Regular rate and rhythm, S1 and S2 normal, no murmur, rub   or gallop  Breast Exam:    Deferred to mammo  Abdomen:     Soft, non-tender, bowel sounds active all four quadrants,    no masses, no organomegaly  Genitalia:    Deferred  Rectal:    Extremities:   Extremities normal, atraumatic, no cyanosis or edema  Pulses:   2+ and symmetric all extremities  Skin:   Skin color, texture, turgor normal, no rashes or lesions  Lymph nodes:   Cervical, supraclavicular, and axillary nodes normal  Neurologic:   CNII-XII intact, normal strength, sensation and reflexes    throughout          Assessment & Plan:

## 2016-04-22 NOTE — Progress Notes (Signed)
Pre visit review using our clinic review tool, if applicable. No additional management support is needed unless otherwise documented below in the visit note. 

## 2016-04-22 NOTE — Patient Instructions (Signed)
Follow up in 6 months to recheck cholesterol We'll notify you of your lab results and make any changes if needed Continue to work on healthy diet and regular exercise- you are doing great!!! You are up to date on colonoscopy until until 2025- yay! You are due for mammo next May You are up to date on all of your immunizations- yay! Call with any questions or concerns Happy Holidays!!!

## 2016-04-22 NOTE — Assessment & Plan Note (Signed)
Chronic problem.  Currently asymptomatic.  Check labs.  Adjust meds prn  

## 2016-04-22 NOTE — Assessment & Plan Note (Signed)
Pt's PE WNL w/ exception of obesity.  She has lost 4 lbs since last visit, applauded her efforts.  UTD on colonoscopy, mammo, DEXA, immunizations.  Check labs.  EKG done- see document for interpretation.  Anticipatory guidance provided.

## 2016-04-23 ENCOUNTER — Other Ambulatory Visit: Payer: Self-pay | Admitting: Family Medicine

## 2016-04-23 MED ORDER — ATORVASTATIN CALCIUM 40 MG PO TABS: 40.0000 mg | ORAL_TABLET | Freq: Every day | ORAL | 6 refills | Status: DC

## 2016-05-11 ENCOUNTER — Other Ambulatory Visit: Payer: Self-pay | Admitting: Family Medicine

## 2016-05-14 ENCOUNTER — Other Ambulatory Visit: Payer: Self-pay | Admitting: General Practice

## 2016-05-14 MED ORDER — FLUTICASONE PROPIONATE 50 MCG/ACT NA SUSP: 2.0000 | Freq: Every day | NASAL | 6 refills | Status: DC

## 2016-08-03 DIAGNOSIS — I8311 Varicose veins of right lower extremity with inflammation: Secondary | ICD-10-CM | POA: Diagnosis not present

## 2016-08-03 DIAGNOSIS — I8312 Varicose veins of left lower extremity with inflammation: Secondary | ICD-10-CM | POA: Diagnosis not present

## 2016-08-04 DIAGNOSIS — I87323 Chronic venous hypertension (idiopathic) with inflammation of bilateral lower extremity: Secondary | ICD-10-CM | POA: Diagnosis not present

## 2016-08-14 ENCOUNTER — Other Ambulatory Visit: Payer: Self-pay | Admitting: Family Medicine

## 2016-09-01 ENCOUNTER — Other Ambulatory Visit: Payer: Self-pay | Admitting: Family Medicine

## 2016-09-01 DIAGNOSIS — Z1231 Encounter for screening mammogram for malignant neoplasm of breast: Secondary | ICD-10-CM

## 2016-10-20 ENCOUNTER — Encounter: Payer: Self-pay | Admitting: Family Medicine

## 2016-10-20 ENCOUNTER — Ambulatory Visit (INDEPENDENT_AMBULATORY_CARE_PROVIDER_SITE_OTHER): Payer: Medicare Other | Admitting: Family Medicine

## 2016-10-20 VITALS — BP 128/83 | HR 84 | Temp 98.0°F | Resp 16 | Ht 62.0 in | Wt 196.1 lb

## 2016-10-20 DIAGNOSIS — E669 Obesity, unspecified: Secondary | ICD-10-CM | POA: Diagnosis not present

## 2016-10-20 DIAGNOSIS — E038 Other specified hypothyroidism: Secondary | ICD-10-CM

## 2016-10-20 DIAGNOSIS — E785 Hyperlipidemia, unspecified: Secondary | ICD-10-CM

## 2016-10-20 LAB — LIPID PANEL
CHOLESTEROL: 187 mg/dL (ref 0–200)
HDL: 45.6 mg/dL (ref >39.00)
LDL Cholesterol: 105 mg/dL — ABNORMAL HIGH (ref 0–99)
NonHDL: 141.71
Total CHOL/HDL Ratio: 4
Triglycerides: 183 mg/dL — ABNORMAL HIGH (ref 0.0–149.0)
VLDL: 36.6 mg/dL (ref 0.0–40.0)

## 2016-10-20 LAB — HEPATIC FUNCTION PANEL
ALK PHOS: 80 U/L (ref 39–117)
ALT: 15 U/L (ref 0–35)
AST: 16 U/L (ref 0–37)
Albumin: 4 g/dL (ref 3.5–5.2)
BILIRUBIN DIRECT: 0.2 mg/dL (ref 0.0–0.3)
BILIRUBIN TOTAL: 1 mg/dL (ref 0.2–1.2)
Total Protein: 6.2 g/dL (ref 6.0–8.3)

## 2016-10-20 LAB — BASIC METABOLIC PANEL
BUN: 10 mg/dL (ref 6–23)
CO2: 31 mEq/L (ref 19–32)
CREATININE: 1.05 mg/dL (ref 0.40–1.20)
Calcium: 9 mg/dL (ref 8.4–10.5)
Chloride: 104 mEq/L (ref 96–112)
GFR: 55.68 mL/min — AB (ref >60.00)
Glucose, Bld: 88 mg/dL (ref 70–99)
Potassium: 3.6 mEq/L (ref 3.5–5.1)
Sodium: 142 mEq/L (ref 135–145)

## 2016-10-20 LAB — TSH: TSH: 4.14 u[IU]/mL (ref 0.35–4.50)

## 2016-10-20 NOTE — Progress Notes (Signed)
   Subjective:    Patient ID: Lynn [name], female    DOB: 1950-10-05, 66 y.o.   MRN: [id number]  HPI Hyperlipidemia- chronic problem, on Lipitor.  Pt has gained 4 lbs since last visit.  No CP, SOB, HAs, visual changes, edema, abd pain, N/V, myalgias.  Hypothyroid- chronic problem, on Levothyroxine 134mcg daily.  Denies excessive fatigue, changes to skin/hair/nails.  Obesity- ongoing issue.  Pt has gained 4 lbs since last visit.  Walking or doing Wii fit.  Not following a particular diet   Review of Systems For ROS see HPI     Objective:   Physical Exam  Constitutional: She is oriented to person, place, and time. She appears well-developed and well-nourished. No distress.  obese  HENT:  Head: Normocephalic and atraumatic.  Eyes: Conjunctivae and EOM are normal. Pupils are equal, round, and reactive to light.  Neck: Normal range of motion. Neck supple. No thyromegaly present.  Cardiovascular: Normal rate, regular rhythm, normal heart sounds and intact distal pulses.   No murmur heard. Pulmonary/Chest: Effort normal and breath sounds normal. No respiratory distress.  Abdominal: Soft. She exhibits no distension. There is no tenderness.  Musculoskeletal: She exhibits no edema.  Lymphadenopathy:    She has no cervical adenopathy.  Neurological: She is alert and oriented to person, place, and time.  Skin: Skin is warm and dry.  Psychiatric: She has a normal mood and affect. Her behavior is normal.  Vitals reviewed.         Assessment & Plan:

## 2016-10-20 NOTE — Assessment & Plan Note (Signed)
Chronic problem.  Pt has gained 4 lbs since last visit.  Stressed need for healthy diet and regular exercise.  Will follow.

## 2016-10-20 NOTE — Assessment & Plan Note (Signed)
Chronic problem.  Hx of good control on Levothyroxine daily.  Asymptomatic at this time.  Check labs.  Adjust meds prn

## 2016-10-20 NOTE — Progress Notes (Signed)
Pre visit review using our clinic review tool, if applicable. No additional management support is needed unless otherwise documented below in the visit note. 

## 2016-10-20 NOTE — Patient Instructions (Signed)
Follow up in 6 months and schedule your Annual Wellness Visit for the same time We'll notify you of your lab results and make any changes if needed Continue to work on healthy diet and regular exercise- you can do it!!! Call with any questions or concerns ENJOY [place]!!!!

## 2016-10-20 NOTE — Assessment & Plan Note (Signed)
Chronic problem.  Tolerating statin w/o difficulty.  Check labs.  Adjust meds prn  

## 2016-10-29 ENCOUNTER — Encounter: Payer: Self-pay | Admitting: Physician Assistant

## 2016-10-29 ENCOUNTER — Ambulatory Visit (INDEPENDENT_AMBULATORY_CARE_PROVIDER_SITE_OTHER): Payer: Medicare Other | Admitting: Physician Assistant

## 2016-10-29 VITALS — BP 140/100 | HR 80 | Temp 98.9°F | Resp 18 | Wt 198.0 lb

## 2016-10-29 DIAGNOSIS — R030 Elevated blood-pressure reading, without diagnosis of hypertension: Secondary | ICD-10-CM

## 2016-10-29 DIAGNOSIS — W57XXXA Bitten or stung by nonvenomous insect and other nonvenomous arthropods, initial encounter: Secondary | ICD-10-CM

## 2016-10-29 NOTE — Progress Notes (Signed)
Patient presents to clinic today c/o bite to left inner thigh noted about 2:30 this AM. States it woke her from sleep. Noted significant throbbing pain in the area with redness. States throbbing pain, about 6-7/10 since that time. Denies itching or swelling around the site. Applied ice to the area with resolution of the redness. Was unable to sleep due to discomfort. Has not taken anything for pain. Endorses mild headache. Denies rash, chest pain, SOB, nausea/vomiting.    Past Medical History:  Diagnosis Date  . Hyperlipidemia   . Hypothyroidism     Current Outpatient Prescriptions on File Prior to Visit  Medication Sig Dispense Refill  . atorvastatin (LIPITOR) 40 MG tablet Take 1 tablet (40 mg total) by mouth daily. 30 tablet 6  . fluticasone (FLONASE) 50 MCG/ACT nasal spray Place 2 sprays into both nostrils daily. 16 g 6  . furosemide (LASIX) 40 MG tablet TK 1 T PO QD  2  . levothyroxine (SYNTHROID, LEVOTHROID) 100 MCG tablet TAKE 1 TABLET BY MOUTH EVERY DAY 90 tablet 0  . promethazine (PHENERGAN) 25 MG tablet Take 25 mg by mouth every 6 (six) hours as needed for nausea or vomiting.     No current facility-administered medications on file prior to visit.     Allergies  Allergen Reactions  . Brimonidine Tartrate   . Sulfa Antibiotics Nausea Only    Family History  Problem Relation Age of Onset  . Colon cancer Father   . Cancer Father        skin  . Cancer Paternal Grandfather        skin  . Pancreatic cancer Neg Hx   . Stomach cancer Neg Hx     Social History   Social History  . Marital status: Married    Spouse name: N/A  . Number of children: N/A  . Years of education: N/A   Social History Main Topics  . Smoking status: Never Smoker  . Smokeless tobacco: Never Used  . Alcohol use No  . Drug use: No  . Sexual activity: Not Asked   Other Topics Concern  . None   Social History Narrative  . None   Review of Systems - See HPI.  All other ROS are  negative.  BP (!) 148/106 (BP Location: Right Arm, Cuff Size: Large)   Pulse (!) 50   Temp 98.9 F (37.2 C) (Oral)   Resp 18   Wt 198 lb (89.8 kg)   SpO2 97%   BMI 36.21 kg/m   Physical Exam  Constitutional: She is oriented to person, place, and time and well-developed, well-nourished, and in no distress.  HENT:  Head: Normocephalic and atraumatic.  Eyes: Conjunctivae are normal.  Cardiovascular: Normal rate, regular rhythm, normal heart sounds and intact distal pulses.   Pulmonary/Chest: Effort normal and breath sounds normal. No respiratory distress. She has no wheezes. She has no rales. She exhibits no tenderness.  Neurological: She is alert and oriented to person, place, and time.  Skin: Skin is warm and dry. No rash noted.     Psychiatric: Affect normal.  Vitals reviewed.   Recent Results (from the past 2160 hour(s))  Basic metabolic panel     Status: Abnormal   Collection Time: 10/20/16  8:56 AM  Result Value Ref Range   Sodium 142 135 - 145 mEq/L   Potassium 3.6 3.5 - 5.1 mEq/L   Chloride 104 96 - 112 mEq/L   CO2 31 19 - 32 mEq/L  Glucose, Bld 88 70 - 99 mg/dL   BUN 10 6 - 23 mg/dL   Creatinine, Ser 1.05 0.40 - 1.20 mg/dL   Calcium 9.0 8.4 - 10.5 mg/dL   GFR 55.68 (L) >60.00 mL/min  Lipid panel     Status: Abnormal   Collection Time: 10/20/16  8:56 AM  Result Value Ref Range   Cholesterol 187 0 - 200 mg/dL    Comment: ATP III Classification       Desirable:  < 200 mg/dL               Borderline High:  200 - 239 mg/dL          High:  > = 240 mg/dL   Triglycerides 183.0 (H) 0.0 - 149.0 mg/dL    Comment: Normal:  <150 mg/dLBorderline High:  150 - 199 mg/dL   HDL 45.60 >39.00 mg/dL   VLDL 36.6 0.0 - 40.0 mg/dL   LDL Cholesterol 105 (H) 0 - 99 mg/dL   Total CHOL/HDL Ratio 4     Comment:                Men          Women1/2 Average Risk     3.4          3.3Average Risk          5.0          4.42X Average Risk          9.6          7.13X Average Risk          15.0           11.0                       NonHDL 141.71     Comment: NOTE:  Non-HDL goal should be 30 mg/dL higher than patient's LDL goal (i.e. LDL goal of < 70 mg/dL, would have non-HDL goal of < 100 mg/dL)  Hepatic function panel     Status: None   Collection Time: 10/20/16  8:56 AM  Result Value Ref Range   Total Bilirubin 1.0 0.2 - 1.2 mg/dL   Bilirubin, Direct 0.2 0.0 - 0.3 mg/dL   Alkaline Phosphatase 80 39 - 117 U/L   AST 16 0 - 37 U/L   ALT 15 0 - 35 U/L   Total Protein 6.2 6.0 - 8.3 g/dL   Albumin 4.0 3.5 - 5.2 g/dL  TSH     Status: None   Collection Time: 10/20/16  8:56 AM  Result Value Ref Range   TSH 4.14 0.35 - 4.50 uIU/mL    Assessment/Plan: 1. Insect bite, initial encounter Left thigh. Suspect spider bite. No sign of local infection or necrosis. Discussed supportive measures. Close watch to area. If any necrosis, ER.   2. Elevated BP without diagnosis of hypertension Significant elevation on arrival. No history of hypertension with BP check a few weeks ago with normotensive BP. Much improved at end of visit but still elevated at 140/100. HR stable. Mild headache but otherwise asymptomatic. Patient is sleep-deprived, in pain and irritable. Discussed DASH diet and home BP checks. She is to rest, hydrate and repeat BP. If not improving she is to call the office. Alarm signs/symptoms discussed with patient that would prompt ER assessment. She voices understanding and agreement with the plan.  Follow-up scheduled early next week for BP check.     Raiford Noble  Einar Pheasant, PA-C

## 2016-10-29 NOTE — Patient Instructions (Addendum)
Please keep skin clean and dry.  Monitor for any swelling or hardness of skin along with redness.  Tylenol or Ibuprofen for pain.  Also monitor for any blackening of skin. If this is present, you need to be seen.   Apply topical Aspercreme with lidocaine that you can get over the counter.   Please follow the diet below to limit sodium to help BP. Stay hydrated and rest. I feel BP is related to lack of sleep, pain, anxiety.  I want you to keep a check on BP at home over the next couple of days.  If not returning to normal (110-130/60-80s), please come see [place].  If you note any chest discomfort, worsening headache, please call 911 or go to the ER.

## 2016-11-03 ENCOUNTER — Ambulatory Visit
Admission: RE | Admit: 2016-11-03 | Discharge: 2016-11-03 | Disposition: A | Discharge status: Home / Self Care | Payer: Medicare Other | Source: Ambulatory Visit | Attending: Family Medicine | Admitting: Family Medicine

## 2016-11-03 DIAGNOSIS — Z1231 Encounter for screening mammogram for malignant neoplasm of breast: Secondary | ICD-10-CM

## 2016-11-04 ENCOUNTER — Other Ambulatory Visit: Payer: Self-pay | Admitting: Family Medicine

## 2016-11-04 DIAGNOSIS — R928 Other abnormal and inconclusive findings on diagnostic imaging of breast: Secondary | ICD-10-CM

## 2016-11-05 ENCOUNTER — Ambulatory Visit (INDEPENDENT_AMBULATORY_CARE_PROVIDER_SITE_OTHER): Payer: Medicare Other | Admitting: Physician Assistant

## 2016-11-05 ENCOUNTER — Encounter: Payer: Self-pay | Admitting: Physician Assistant

## 2016-11-05 VITALS — BP 130/80 | HR 85 | Temp 98.6°F | Resp 16 | Ht 62.0 in | Wt 195.0 lb

## 2016-11-05 DIAGNOSIS — R030 Elevated blood-pressure reading, without diagnosis of hypertension: Secondary | ICD-10-CM | POA: Insufficient documentation

## 2016-11-05 DIAGNOSIS — I10 Essential (primary) hypertension: Secondary | ICD-10-CM | POA: Insufficient documentation

## 2016-11-05 NOTE — Patient Instructions (Signed)
BP much improved today.  Continue DASH diet. Increase aerobic exercise to help with BP.  Continue home BP checks at least twice per week. Write these down. Bring to follow-up with Dr. Birdie Riddle in 3 months. If climbing on home checks, please call or return to clinic sooner.    DASH Eating Plan DASH stands for "Dietary Approaches to Stop Hypertension." The DASH eating plan is a healthy eating plan that has been shown to reduce high blood pressure (hypertension). It may also reduce your risk for type 2 diabetes, heart disease, and stroke. The DASH eating plan may also help with weight loss. What are tips for following this plan? General guidelines  Avoid eating more than 2,300 mg (milligrams) of salt (sodium) a day. If you have hypertension, you may need to reduce your sodium intake to 1,500 mg a day.  Limit alcohol intake to no more than 1 drink a day for nonpregnant women and 2 drinks a day for men. One drink equals 12 oz of beer, 5 oz of wine, or 1 oz of hard liquor.  Work with your health care provider to maintain a healthy body weight or to lose weight. Ask what an ideal weight is for you.  Get at least 30 minutes of exercise that causes your heart to beat faster (aerobic exercise) most days of the week. Activities may include walking, swimming, or biking.  Work with your health care provider or diet and nutrition specialist (dietitian) to adjust your eating plan to your individual calorie needs. Reading food labels  Check food labels for the amount of sodium per serving. Choose foods with less than 5 percent of the Daily Value of sodium. Generally, foods with less than 300 mg of sodium per serving fit into this eating plan.  To find whole grains, look for the word "whole" as the first word in the ingredient list. Shopping  Buy products labeled as "low-sodium" or "no salt added."  Buy fresh foods. Avoid canned foods and premade or frozen meals. Cooking  Avoid adding salt when  cooking. Use salt-free seasonings or herbs instead of table salt or sea salt. Check with your health care provider or pharmacist before using salt substitutes.  Do not fry foods. Cook foods using healthy methods such as baking, boiling, grilling, and broiling instead.  Cook with heart-healthy oils, such as olive, canola, soybean, or sunflower oil. Meal planning   Eat a balanced diet that includes: ? 5 or more servings of fruits and vegetables each day. At each meal, try to fill half of your plate with fruits and vegetables. ? Up to 6-8 servings of whole grains each day. ? Less than 6 oz of lean meat, poultry, or fish each day. A 3-oz serving of meat is about the same size as a deck of cards. One egg equals 1 oz. ? 2 servings of low-fat dairy each day. ? A serving of nuts, seeds, or beans 5 times each week. ? Heart-healthy fats. Healthy fats called Omega-3 fatty acids are found in foods such as flaxseeds and coldwater fish, like sardines, salmon, and mackerel.  Limit how much you eat of the following: ? Canned or prepackaged foods. ? Food that is high in trans fat, such as fried foods. ? Food that is high in saturated fat, such as fatty meat. ? Sweets, desserts, sugary drinks, and other foods with added sugar. ? Full-fat dairy products.  Do not salt foods before eating.  Try to eat at least 2 vegetarian meals each  week.  Eat more home-cooked food and less restaurant, buffet, and fast food.  When eating at a restaurant, ask that your food be prepared with less salt or no salt, if possible. What foods are recommended? The items listed may not be a complete list. Talk with your dietitian about what dietary choices are best for you. Grains Whole-grain or whole-wheat bread. Whole-grain or whole-wheat pasta. Brown rice. [place]row. Bulgur. Whole-grain and low-sodium cereals. Pita bread. Low-fat, low-sodium crackers. Whole-wheat flour tortillas. Vegetables Fresh or frozen vegetables  (raw, steamed, roasted, or grilled). Low-sodium or reduced-sodium tomato and vegetable juice. Low-sodium or reduced-sodium tomato sauce and tomato paste. Low-sodium or reduced-sodium canned vegetables. Fruits All fresh, dried, or frozen fruit. Canned fruit in natural juice (without added sugar). Meat and other protein foods Skinless chicken or [place]. Ground chicken or [place]. Pork with fat trimmed off. Fish and seafood. Egg whites. Dried beans, peas, or lentils. Unsalted nuts, nut butters, and seeds. Unsalted canned beans. Lean cuts of beef with fat trimmed off. Low-sodium, lean deli meat. Dairy Low-fat (1%) or fat-free (skim) milk. Fat-free, low-fat, or reduced-fat cheeses. Nonfat, low-sodium ricotta or cottage cheese. Low-fat or nonfat yogurt. Low-fat, low-sodium cheese. Fats and oils Soft margarine without trans fats. Vegetable oil. Low-fat, reduced-fat, or light mayonnaise and salad dressings (reduced-sodium). Canola, safflower, olive, soybean, and sunflower oils. Avocado. Seasoning and other foods Herbs. Spices. Seasoning mixes without salt. Unsalted popcorn and pretzels. Fat-free sweets. What foods are not recommended? The items listed may not be a complete list. Talk with your dietitian about what dietary choices are best for you. Grains Baked goods made with fat, such as croissants, muffins, or some breads. Dry pasta or rice meal packs. Vegetables Creamed or fried vegetables. Vegetables in a cheese sauce. Regular canned vegetables (not low-sodium or reduced-sodium). Regular canned tomato sauce and paste (not low-sodium or reduced-sodium). Regular tomato and vegetable juice (not low-sodium or reduced-sodium). Angie Fava. Olives. Fruits Canned fruit in a light or heavy syrup. Fried fruit. Fruit in cream or butter sauce. Meat and other protein foods Fatty cuts of meat. Ribs. Fried meat. Berniece Salines. Sausage. Bologna and other processed lunch meats. Salami. Fatback. Hotdogs. Bratwurst. Salted nuts  and seeds. Canned beans with added salt. Canned or smoked fish. Whole eggs or egg yolks. Chicken or [place] with skin. Dairy Whole or 2% milk, cream, and half-and-half. Whole or full-fat cream cheese. Whole-fat or sweetened yogurt. Full-fat cheese. Nondairy creamers. Whipped toppings. Processed cheese and cheese spreads. Fats and oils Butter. Stick margarine. Lard. Shortening. Ghee. Bacon fat. Tropical oils, such as coconut, palm kernel, or palm oil. Seasoning and other foods Salted popcorn and pretzels. Onion salt, garlic salt, seasoned salt, table salt, and sea salt. Worcestershire sauce. Tartar sauce. Barbecue sauce. Teriyaki sauce. Soy sauce, including reduced-sodium. Steak sauce. Canned and packaged gravies. Fish sauce. Oyster sauce. Cocktail sauce. Horseradish that you find on the shelf. Ketchup. Mustard. Meat flavorings and tenderizers. Bouillon cubes. Hot sauce and Tabasco sauce. Premade or packaged marinades. Premade or packaged taco seasonings. Relishes. Regular salad dressings. Where to find more information:  National Heart, Lung, and [place]: https://wilson-eaton.com/  American Heart Association: www.heart.org Summary  The DASH eating plan is a healthy eating plan that has been shown to reduce high blood pressure (hypertension). It may also reduce your risk for type 2 diabetes, heart disease, and stroke.  With the DASH eating plan, you should limit salt (sodium) intake to 2,300 mg a day. If you have hypertension, you may need to reduce your  sodium intake to 1,500 mg a day.  When on the DASH eating plan, aim to eat more fresh fruits and vegetables, whole grains, lean proteins, low-fat dairy, and heart-healthy fats.  Work with your health care provider or diet and nutrition specialist (dietitian) to adjust your eating plan to your individual calorie needs. This information is not intended to replace advice given to you by your health care provider. Make sure you discuss any questions  you have with your health care provider. Document Released: 05/14/2011 Document Revised: 05/18/2016 Document Reviewed: 05/18/2016 Elsevier Interactive Patient Education  2017 [employer].

## 2016-11-05 NOTE — Assessment & Plan Note (Signed)
BP improved today. Pre-hypertensive. Continue DASH diet. Exercise recommendations recommended. Follow-up with PCP in 3 months for recheck. Continue home BP checks. She is to return sooner if BP climbing.

## 2016-11-05 NOTE — Progress Notes (Signed)
Patient presents to clinic today for follow-up of elevated BP noted at last visit (acute for insect bite). Patient's BP significantly elevated on arrival to clinic at last visit, in 190s/110s. BP quickly improved down to 140/100. Patient was in pain, agitated and had lack of sleep, felt to be contributing to her BP. Patient without any known history of hypertension. DASH diet started. Patient to monitor BP at home and return for recheck. Since last visit, patient endorses following dietary recommendations. Ie checking BP at home is averaging 130/80. Patient denies chest pain, palpitations, lightheadedness, dizziness, vision changes or frequent headaches.    Past Medical History:  Diagnosis Date  . Hyperlipidemia   . Hypothyroidism     Current Outpatient Prescriptions on File Prior to Visit  Medication Sig Dispense Refill  . atorvastatin (LIPITOR) 40 MG tablet Take 1 tablet (40 mg total) by mouth daily. 30 tablet 6  . fluticasone (FLONASE) 50 MCG/ACT nasal spray Place 2 sprays into both nostrils daily. 16 g 6  . furosemide (LASIX) 40 MG tablet TK 1 T PO QD  2  . levothyroxine (SYNTHROID, LEVOTHROID) 100 MCG tablet TAKE 1 TABLET BY MOUTH EVERY DAY 90 tablet 0  . promethazine (PHENERGAN) 25 MG tablet Take 25 mg by mouth every 6 (six) hours as needed for nausea or vomiting.     No current facility-administered medications on file prior to visit.     Allergies  Allergen Reactions  . Brimonidine Tartrate   . Sulfa Antibiotics Nausea Only    Family History  Problem Relation Age of Onset  . Colon cancer Father   . Cancer Father        skin  . Cancer Paternal Grandfather        skin  . Pancreatic cancer Neg Hx   . Stomach cancer Neg Hx     Social History   Social History  . Marital status: Married    Spouse name: N/A  . Number of children: N/A  . Years of education: N/A   Social History Main Topics  . Smoking status: Never Smoker  . Smokeless tobacco: Never Used  . Alcohol  use No  . Drug use: No  . Sexual activity: Not Asked   Other Topics Concern  . None   Social History Narrative  . None   Review of Systems - See HPI.  All other ROS are negative.  BP 130/80 (BP Location: Right Arm, Cuff Size: Normal)   Pulse 85   Temp 98.6 F (37 C) (Oral)   Resp 16   Ht 5\' 2"  (1.575 m)   Wt 195 lb (88.5 kg)   SpO2 97%   BMI 35.67 kg/m   Physical Exam  Constitutional: She is oriented to person, place, and time and well-developed, well-nourished, and in no distress.  HENT:  Head: Normocephalic and atraumatic.  Eyes: Conjunctivae are normal.  Neck: Neck supple.  Cardiovascular: Normal rate, regular rhythm, normal heart sounds and intact distal pulses.   Pulmonary/Chest: Effort normal and breath sounds normal. No respiratory distress. She has no wheezes. She has no rales. She exhibits no tenderness.  Neurological: She is alert and oriented to person, place, and time.  Skin: Skin is warm and dry. No rash noted.  Psychiatric: Affect normal.  Vitals reviewed.   Recent Results (from the past 2160 hour(s))  Basic metabolic panel     Status: Abnormal   Collection Time: 10/20/16  8:56 AM  Result Value Ref Range   Sodium 142  135 - 145 mEq/L   Potassium 3.6 3.5 - 5.1 mEq/L   Chloride 104 96 - 112 mEq/L   CO2 31 19 - 32 mEq/L   Glucose, Bld 88 70 - 99 mg/dL   BUN 10 6 - 23 mg/dL   Creatinine, Ser 1.05 0.40 - 1.20 mg/dL   Calcium 9.0 8.4 - 10.5 mg/dL   GFR 55.68 (L) >60.00 mL/min  Lipid panel     Status: Abnormal   Collection Time: 10/20/16  8:56 AM  Result Value Ref Range   Cholesterol 187 0 - 200 mg/dL    Comment: ATP III Classification       Desirable:  < 200 mg/dL               Borderline High:  200 - 239 mg/dL          High:  > = 240 mg/dL   Triglycerides 183.0 (H) 0.0 - 149.0 mg/dL    Comment: Normal:  <150 mg/dLBorderline High:  150 - 199 mg/dL   HDL 45.60 >39.00 mg/dL   VLDL 36.6 0.0 - 40.0 mg/dL   LDL Cholesterol 105 (H) 0 - 99 mg/dL   Total  CHOL/HDL Ratio 4     Comment:                Men          Women1/2 Average Risk     3.4          3.3Average Risk          5.0          4.42X Average Risk          9.6          7.13X Average Risk          15.0          11.0                       NonHDL 141.71     Comment: NOTE:  Non-HDL goal should be 30 mg/dL higher than patient's LDL goal (i.e. LDL goal of < 70 mg/dL, would have non-HDL goal of < 100 mg/dL)  Hepatic function panel     Status: None   Collection Time: 10/20/16  8:56 AM  Result Value Ref Range   Total Bilirubin 1.0 0.2 - 1.2 mg/dL   Bilirubin, Direct 0.2 0.0 - 0.3 mg/dL   Alkaline Phosphatase 80 39 - 117 U/L   AST 16 0 - 37 U/L   ALT 15 0 - 35 U/L   Total Protein 6.2 6.0 - 8.3 g/dL   Albumin 4.0 3.5 - 5.2 g/dL  TSH     Status: None   Collection Time: 10/20/16  8:56 AM  Result Value Ref Range   TSH 4.14 0.35 - 4.50 uIU/mL    Assessment/Plan: Elevated BP without diagnosis of hypertension BP improved today. Pre-hypertensive. Continue DASH diet. Exercise recommendations recommended. Follow-up with PCP in 3 months for recheck. Continue home BP checks. She is to return sooner if BP climbing.     Leeanne Rio, PA-C

## 2016-11-05 NOTE — Progress Notes (Signed)
Pre visit review using our clinic review tool, if applicable. No additional management support is needed unless otherwise documented below in the visit note. 

## 2016-11-06 ENCOUNTER — Ambulatory Visit
Admission: RE | Admit: 2016-11-06 | Discharge: 2016-11-06 | Disposition: A | Discharge status: Home / Self Care | Payer: Medicare Other | Source: Ambulatory Visit | Attending: Family Medicine | Admitting: Family Medicine

## 2016-11-06 DIAGNOSIS — R922 Inconclusive mammogram: Secondary | ICD-10-CM | POA: Diagnosis not present

## 2016-11-06 DIAGNOSIS — R928 Other abnormal and inconclusive findings on diagnostic imaging of breast: Secondary | ICD-10-CM

## 2016-11-16 ENCOUNTER — Other Ambulatory Visit: Payer: Self-pay | Admitting: Emergency Medicine

## 2016-11-16 ENCOUNTER — Other Ambulatory Visit: Payer: Self-pay | Admitting: Family Medicine

## 2016-11-16 MED ORDER — FUROSEMIDE 40 MG PO TABS: 40.0000 mg | ORAL_TABLET | Freq: Every day | ORAL | 2 refills | Status: DC

## 2017-02-02 ENCOUNTER — Encounter: Payer: Self-pay | Admitting: Family Medicine

## 2017-02-02 ENCOUNTER — Ambulatory Visit (INDEPENDENT_AMBULATORY_CARE_PROVIDER_SITE_OTHER): Payer: Medicare Other | Admitting: Family Medicine

## 2017-02-02 ENCOUNTER — Other Ambulatory Visit: Payer: Self-pay | Admitting: Dermatology

## 2017-02-02 VITALS — BP 133/83 | HR 93 | Temp 98.3°F | Resp 16 | Ht 62.0 in | Wt 194.1 lb

## 2017-02-02 DIAGNOSIS — R030 Elevated blood-pressure reading, without diagnosis of hypertension: Secondary | ICD-10-CM

## 2017-02-02 DIAGNOSIS — Z23 Encounter for immunization: Secondary | ICD-10-CM | POA: Diagnosis not present

## 2017-02-02 DIAGNOSIS — D229 Melanocytic nevi, unspecified: Secondary | ICD-10-CM | POA: Diagnosis not present

## 2017-02-02 DIAGNOSIS — D492 Neoplasm of unspecified behavior of bone, soft tissue, and skin: Secondary | ICD-10-CM | POA: Diagnosis not present

## 2017-02-02 DIAGNOSIS — L570 Actinic keratosis: Secondary | ICD-10-CM | POA: Diagnosis not present

## 2017-02-02 DIAGNOSIS — D0472 Carcinoma in situ of skin of left lower limb, including hip: Secondary | ICD-10-CM | POA: Diagnosis not present

## 2017-02-02 NOTE — Patient Instructions (Signed)
Follow up w/ me as scheduled in November Keep up the good work!  You look great! Call with any questions or concerns Happy Labor Day!!

## 2017-02-02 NOTE — Assessment & Plan Note (Signed)
Adequate control today.  Asymptomatic.  No need for meds at this time.  Will follow.

## 2017-02-02 NOTE — Progress Notes (Signed)
Pre visit review using our clinic review tool, if applicable. No additional management support is needed unless otherwise documented below in the visit note. 

## 2017-02-02 NOTE — Progress Notes (Signed)
   Subjective:    Patient ID: MARJIE CHEA, female    DOB: 1950-10-13, 66 y.o.   MRN: [id number]  HPI Elevated BP- pt's BP was elevated when she saw PA for spider bite.  Pt reports feeling well.  No CP, SOB, HAs, visual changes, edema.   Review of Systems For ROS see HPI     Objective:   Physical Exam  Constitutional: She is oriented to person, place, and time. She appears well-developed and well-nourished. No distress.  HENT:  Head: Normocephalic and atraumatic.  Eyes: Pupils are equal, round, and reactive to light. Conjunctivae and EOM are normal.  Neck: Normal range of motion. Neck supple. No thyromegaly present.  Cardiovascular: Normal rate, regular rhythm, normal heart sounds and intact distal pulses.   No murmur heard. Pulmonary/Chest: Effort normal and breath sounds normal. No respiratory distress.  Abdominal: Soft. She exhibits no distension. There is no tenderness.  Musculoskeletal: She exhibits no edema.  Lymphadenopathy:    She has no cervical adenopathy.  Neurological: She is alert and oriented to person, place, and time.  Skin: Skin is warm and dry.  Psychiatric: She has a normal mood and affect. Her behavior is normal.  Vitals reviewed.         Assessment & Plan:

## 2017-02-11 ENCOUNTER — Other Ambulatory Visit: Payer: Self-pay | Admitting: Family Medicine

## 2017-02-15 ENCOUNTER — Other Ambulatory Visit: Payer: Self-pay | Admitting: Family Medicine

## 2017-03-22 DIAGNOSIS — D0472 Carcinoma in situ of skin of left lower limb, including hip: Secondary | ICD-10-CM | POA: Diagnosis not present

## 2017-03-23 DIAGNOSIS — H43812 Vitreous degeneration, left eye: Secondary | ICD-10-CM | POA: Diagnosis not present

## 2017-04-15 ENCOUNTER — Other Ambulatory Visit: Payer: Self-pay

## 2017-04-15 ENCOUNTER — Encounter: Payer: Self-pay | Admitting: Family Medicine

## 2017-04-15 ENCOUNTER — Ambulatory Visit (INDEPENDENT_AMBULATORY_CARE_PROVIDER_SITE_OTHER): Payer: Medicare Other | Admitting: Family Medicine

## 2017-04-15 ENCOUNTER — Encounter: Payer: Self-pay | Admitting: General Practice

## 2017-04-15 VITALS — BP 136/84 | HR 82 | Temp 98.7°F | Resp 16 | Ht 62.0 in | Wt 197.4 lb

## 2017-04-15 DIAGNOSIS — E785 Hyperlipidemia, unspecified: Secondary | ICD-10-CM

## 2017-04-15 DIAGNOSIS — E038 Other specified hypothyroidism: Secondary | ICD-10-CM | POA: Diagnosis not present

## 2017-04-15 DIAGNOSIS — R030 Elevated blood-pressure reading, without diagnosis of hypertension: Secondary | ICD-10-CM | POA: Diagnosis not present

## 2017-04-15 LAB — HEPATIC FUNCTION PANEL
ALBUMIN: 4.1 g/dL (ref 3.5–5.2)
ALK PHOS: 83 U/L (ref 39–117)
ALT: 16 U/L (ref 0–35)
AST: 17 U/L (ref 0–37)
Bilirubin, Direct: 0.2 mg/dL (ref 0.0–0.3)
TOTAL PROTEIN: 6.5 g/dL (ref 6.0–8.3)
Total Bilirubin: 0.9 mg/dL (ref 0.2–1.2)

## 2017-04-15 LAB — LIPID PANEL
CHOL/HDL RATIO: 4
CHOLESTEROL: 204 mg/dL — AB (ref 0–200)
HDL: 47.7 mg/dL (ref >39.00)
LDL CALC: 125 mg/dL — AB (ref 0–99)
NonHDL: 156.22
TRIGLYCERIDES: 155 mg/dL — AB (ref 0.0–149.0)
VLDL: 31 mg/dL (ref 0.0–40.0)

## 2017-04-15 LAB — CBC WITH DIFFERENTIAL/PLATELET
BASOS PCT: 2.3 % (ref 0.0–3.0)
Basophils Absolute: 0.1 10*3/uL (ref 0.0–0.1)
EOS PCT: 2.2 % (ref 0.0–5.0)
Eosinophils Absolute: 0.1 10*3/uL (ref 0.0–0.7)
HEMATOCRIT: 44.8 % (ref 36.0–46.0)
HEMOGLOBIN: 15.1 g/dL — AB (ref 12.0–15.0)
LYMPHS PCT: 33.1 % (ref 12.0–46.0)
Lymphs Abs: 1.6 10*3/uL (ref 0.7–4.0)
MCHC: 33.7 g/dL (ref 30.0–36.0)
MCV: 87.8 fl (ref 78.0–100.0)
MONO ABS: 0.4 10*3/uL (ref 0.1–1.0)
MONOS PCT: 8.4 % (ref 3.0–12.0)
Neutro Abs: 2.7 10*3/uL (ref 1.4–7.7)
Neutrophils Relative %: 54 % (ref 43.0–77.0)
Platelets: 185 10*3/uL (ref 150.0–400.0)
RBC: 5.1 Mil/uL (ref 3.87–5.11)
RDW: 12.5 % (ref 11.5–15.5)
WBC: 4.9 10*3/uL (ref 4.0–10.5)

## 2017-04-15 LAB — BASIC METABOLIC PANEL
BUN: 9 mg/dL (ref 6–23)
CALCIUM: 9.3 mg/dL (ref 8.4–10.5)
CO2: 34 mEq/L — ABNORMAL HIGH (ref 19–32)
CREATININE: 0.96 mg/dL (ref 0.40–1.20)
Chloride: 103 mEq/L (ref 96–112)
GFR: 61.66 mL/min (ref >60.00)
GLUCOSE: 98 mg/dL (ref 70–99)
POTASSIUM: 3.9 meq/L (ref 3.5–5.1)
Sodium: 144 mEq/L (ref 135–145)

## 2017-04-15 LAB — TSH: TSH: 0.84 u[IU]/mL (ref 0.35–4.50)

## 2017-04-15 NOTE — Assessment & Plan Note (Signed)
Chronic problem.  Currently asymptomatic.  Check labs.  Adjust meds prn  

## 2017-04-15 NOTE — Assessment & Plan Note (Signed)
Adequate control today.  Asymptomatic.  Check labs.  No anticipated med changes.  Will follow.

## 2017-04-15 NOTE — Assessment & Plan Note (Signed)
Chronic problem.  Tolerating statin w/o difficulty.  Stressed need for healthy diet and regular exercise.  Check labs.  Adjust meds prn  

## 2017-04-15 NOTE — Patient Instructions (Signed)
Schedule your Medicare Wellness Visit in 6 months and a follow up with me at the same time [facility] notify you of your lab results and make any changes if needed Continue to work on healthy diet and regular exercise- you can do it! Call with any questions or concerns Enjoy your trip to [place]!!!

## 2017-04-15 NOTE — Progress Notes (Signed)
   Subjective:    Patient ID: Lynn [name], female    DOB: 1951/04/14, 66 y.o.   MRN: [id number]  HPI Elevated BP- chronic problem, on Lasix w/ adequate BP control.  No CP, SOB, HAs, visual changes, edema.  Hyperlipidemia- chronic problem, on Lipitor.  Pt has gained 4 lbs.  Walking regularly.  No abd pain, N/V.  Hypothyroid- chronic problem, on Levothyroxine 127mcg daily.  No excessive fatigue, no changes to skin/hair/nails.  No issues w/ diarrhea or constipation.   Review of Systems For ROS see HPI     Objective:   Physical Exam  Constitutional: She is oriented to person, place, and time. She appears well-developed and well-nourished. No distress.  HENT:  Head: Normocephalic and atraumatic.  Eyes: Conjunctivae and EOM are normal. Pupils are equal, round, and reactive to light.  Neck: Normal range of motion. Neck supple. No thyromegaly present.  Cardiovascular: Normal rate, regular rhythm, normal heart sounds and intact distal pulses.  No murmur heard. Pulmonary/Chest: Effort normal and breath sounds normal. No respiratory distress.  Abdominal: Soft. She exhibits no distension. There is no tenderness.  Musculoskeletal: She exhibits no edema.  Lymphadenopathy:    She has no cervical adenopathy.  Neurological: She is alert and oriented to person, place, and time.  Skin: Skin is warm and dry.  Psychiatric: She has a normal mood and affect. Her behavior is normal.  Vitals reviewed.         Assessment & Plan:

## 2017-04-26 ENCOUNTER — Other Ambulatory Visit: Payer: Self-pay

## 2017-04-26 ENCOUNTER — Ambulatory Visit (INDEPENDENT_AMBULATORY_CARE_PROVIDER_SITE_OTHER): Payer: Medicare Other | Admitting: Physician Assistant

## 2017-04-26 ENCOUNTER — Other Ambulatory Visit: Payer: Self-pay | Admitting: Physician Assistant

## 2017-04-26 ENCOUNTER — Encounter: Payer: Self-pay | Admitting: Physician Assistant

## 2017-04-26 VITALS — BP 128/90 | HR 85 | Temp 98.2°F | Resp 14 | Ht 62.0 in | Wt 199.0 lb

## 2017-04-26 DIAGNOSIS — S8981XA Other specified injuries of right lower leg, initial encounter: Secondary | ICD-10-CM

## 2017-04-26 MED ORDER — CYCLOBENZAPRINE HCL 10 MG PO TABS: 10.0000 mg | ORAL_TABLET | Freq: Every day | ORAL | 0 refills | Status: DC

## 2017-04-26 MED ORDER — MELOXICAM 15 MG PO TABS: 15.0000 mg | ORAL_TABLET | Freq: Every day | ORAL | 0 refills | Status: DC

## 2017-04-26 NOTE — Patient Instructions (Signed)
Please elevate leg while resting.  Get a knee sleeve at the pharmacy/[facility] to wear daily. You can also use an ACE wrap.   Take Meloxicam as directed for a few days to calm down pain and inflammation. Take with food. Tylenol for breakthrough pain. Use Flexeril in the evening.  Follow-up if symptoms are not resolving or if new symptoms develop.

## 2017-04-26 NOTE — Progress Notes (Signed)
Patient presents to clinic today c/o pain in R back, hip and knee s/p fall on Saturday after slipping in the yard and landing on her back and R hip. Notes leg hyperextended during fall. Denies prodromal symptoms -- lightheadedness, racing heart, dizziness or SOB at time of fall. Was able to get herself up. Denies bruising. Endorses pain in throbbing in nature and a 5/10. Marland Kitchen Has taken some Ibuprofen and has iced and elevated the area. Patient endorses history of R meniscal tear s/p repair. Denies buckling of knee. Pain is more posterior.  Past Medical History:  Diagnosis Date  . Hyperlipidemia   . Hypothyroidism     Current Outpatient Medications on File Prior to Visit  Medication Sig Dispense Refill  . atorvastatin (LIPITOR) 40 MG tablet TAKE 1 TABLET(40 MG) BY MOUTH DAILY 90 tablet 1  . fluticasone (FLONASE) 50 MCG/ACT nasal spray Place 2 sprays into both nostrils daily. 16 g 6  . furosemide (LASIX) 40 MG tablet Take 1 tablet (40 mg total) by mouth daily. 30 tablet 2  . levothyroxine (SYNTHROID, LEVOTHROID) 100 MCG tablet TAKE 1 TABLET BY MOUTH EVERY DAY 90 tablet 0  . promethazine (PHENERGAN) 25 MG tablet Take 25 mg by mouth every 6 (six) hours as needed for nausea or vomiting.     No current facility-administered medications on file prior to visit.     Allergies  Allergen Reactions  . Brimonidine Tartrate   . Sulfa Antibiotics Nausea Only    Family History  Problem Relation Age of Onset  . Colon cancer Father   . Cancer Father        skin  . Cancer Paternal Grandfather        skin  . Pancreatic cancer Neg Hx   . Stomach cancer Neg Hx     Social History   Socioeconomic History  . Marital status: Married    Spouse name: None  . Number of children: None  . Years of education: None  . Highest education level: None  Social Needs  . Financial resource strain: None  . Food insecurity - worry: None  . Food insecurity - inability: None  . Transportation needs - medical:  None  . Transportation needs - non-medical: None  Occupational History  . None  Tobacco Use  . Smoking status: Never Smoker  . Smokeless tobacco: Never Used  Substance and Sexual Activity  . Alcohol use: No  . Drug use: No  . Sexual activity: None  Other Topics Concern  . None  Social History Narrative  . None   Review of Systems - See HPI.  All other ROS are negative.  BP 128/90   Pulse 85   Temp 98.2 F (36.8 C) (Oral)   Resp 14   Ht 5\' 2"  (1.575 m)   Wt 199 lb (90.3 kg)   SpO2 97%   BMI 36.40 kg/m   Physical Exam  Constitutional: She is oriented to person, place, and time and well-developed, well-nourished, and in no distress.  HENT:  Head: Normocephalic and atraumatic.  Eyes: Conjunctivae are normal.  Neck: Neck supple.  Cardiovascular: Normal rate, regular rhythm, normal heart sounds and intact distal pulses.  Pulmonary/Chest: Effort normal and breath sounds normal. No respiratory distress. She has no wheezes. She has no rales. She exhibits no tenderness.  Musculoskeletal:       Right knee: She exhibits swelling. She exhibits normal range of motion, no effusion, normal patellar mobility and normal meniscus. No medial joint  line, no lateral joint line, no MCL, no LCL and no patellar tendon tenderness noted.  Pain with extension of the knee.    Neurological: She is alert and oriented to person, place, and time.  Skin: Skin is warm and dry. No rash noted.  Psychiatric: Affect normal.  Vitals reviewed.   Recent Results (from the past 2160 hour(s))  Lipid panel     Status: Abnormal   Collection Time: 04/15/17 10:11 AM  Result Value Ref Range   Cholesterol 204 (H) 0 - 200 mg/dL    Comment: ATP III Classification       Desirable:  < 200 mg/dL               Borderline High:  200 - 239 mg/dL          High:  > = 240 mg/dL   Triglycerides 155.0 (H) 0.0 - 149.0 mg/dL    Comment: Normal:  <150 mg/dLBorderline High:  150 - 199 mg/dL   HDL 47.70 >39.00 mg/dL   VLDL 31.0  0.0 - 40.0 mg/dL   LDL Cholesterol 125 (H) 0 - 99 mg/dL   Total CHOL/HDL Ratio 4     Comment:                Men          Women1/2 Average Risk     3.4          3.3Average Risk          5.0          4.42X Average Risk          9.6          7.13X Average Risk          15.0          11.0                       NonHDL 156.22     Comment: NOTE:  Non-HDL goal should be 30 mg/dL higher than patient's LDL goal (i.e. LDL goal of < 70 mg/dL, would have non-HDL goal of < 100 mg/dL)  Basic metabolic panel     Status: Abnormal   Collection Time: 04/15/17 10:11 AM  Result Value Ref Range   Sodium 144 135 - 145 mEq/L   Potassium 3.9 3.5 - 5.1 mEq/L   Chloride 103 96 - 112 mEq/L   CO2 34 (H) 19 - 32 mEq/L   Glucose, Bld 98 70 - 99 mg/dL   BUN 9 6 - 23 mg/dL   Creatinine, Ser 0.96 0.40 - 1.20 mg/dL   Calcium 9.3 8.4 - 10.5 mg/dL   GFR 61.66 >60.00 mL/min  TSH     Status: None   Collection Time: 04/15/17 10:11 AM  Result Value Ref Range   TSH 0.84 0.35 - 4.50 uIU/mL  Hepatic function panel     Status: None   Collection Time: 04/15/17 10:11 AM  Result Value Ref Range   Total Bilirubin 0.9 0.2 - 1.2 mg/dL   Bilirubin, Direct 0.2 0.0 - 0.3 mg/dL   Alkaline Phosphatase 83 39 - 117 U/L   AST 17 0 - 37 U/L   ALT 16 0 - 35 U/L   Total Protein 6.5 6.0 - 8.3 g/dL   Albumin 4.1 3.5 - 5.2 g/dL  CBC with Differential/Platelet     Status: Abnormal   Collection Time: 04/15/17 10:11 AM  Result Value Ref Range   WBC  4.9 4.0 - 10.5 K/uL   RBC 5.10 3.87 - 5.11 Mil/uL   Hemoglobin 15.1 (H) 12.0 - 15.0 g/dL   HCT 44.8 36.0 - 46.0 %   MCV 87.8 78.0 - 100.0 fl   MCHC 33.7 30.0 - 36.0 g/dL   RDW 12.5 11.5 - 15.5 %   Platelets 185.0 150.0 - 400.0 K/uL   Neutrophils Relative % 54.0 43.0 - 77.0 %   Lymphocytes Relative 33.1 12.0 - 46.0 %   Monocytes Relative 8.4 3.0 - 12.0 %   Eosinophils Relative 2.2 0.0 - 5.0 %   Basophils Relative 2.3 0.0 - 3.0 %   Neutro Abs 2.7 1.4 - 7.7 K/uL   Lymphs Abs 1.6 0.7 - 4.0  K/uL   Monocytes Absolute 0.4 0.1 - 1.0 K/uL   Eosinophils Absolute 0.1 0.0 - 0.7 K/uL   Basophils Absolute 0.1 0.0 - 0.1 K/uL   Assessment/Plan: 1. Hyperextension injury of right knee, initial encounter Start Mobic once daily. Tylenol for breakthrough pain. RICE. Flexeril in the evening. Follow-up if not improving.  - meloxicam (MOBIC) 15 MG tablet; Take 1 tablet (15 mg total) daily by mouth.  Dispense: 30 tablet; Refill: 0 - cyclobenzaprine (FLEXERIL) 10 MG tablet; Take 1 tablet (10 mg total) at bedtime by mouth.  Dispense: 30 tablet; Refill: 0   Leeanne Rio, PA-C

## 2017-04-26 NOTE — Progress Notes (Signed)
Pre visit review using our clinic review tool, if applicable. No additional management support is needed unless otherwise documented below in the visit note. 

## 2017-04-28 ENCOUNTER — Ambulatory Visit: Payer: Medicare Other

## 2017-05-11 ENCOUNTER — Ambulatory Visit: Payer: Medicare Other | Admitting: Physician Assistant

## 2017-05-31 ENCOUNTER — Telehealth: Payer: Self-pay | Admitting: Family Medicine

## 2017-05-31 NOTE — Telephone Encounter (Signed)
Copied from [place] [id number]. Topic: Quick Communication - See Telephone Encounter >> May 31, 2017 12:03 PM Cleaster Corin, NT wrote: CRM for notification. See Telephone encounter for:   05/31/17. Pt would like to schedule her medical wellness visit. Canceled appt. On Thursday due to family coming in town. Pt. Would like to speak with health coach on re scheduling appt.

## 2017-06-03 ENCOUNTER — Ambulatory Visit: Payer: Medicare Other

## 2017-06-07 ENCOUNTER — Other Ambulatory Visit: Payer: Self-pay | Admitting: Family Medicine

## 2017-06-07 NOTE — Progress Notes (Addendum)
Subjective:   Lynn [name] is a 66 y.o. female who presents for Medicare Annual (Subsequent) preventive examination.  Review of Systems:  No ROS.  Medicare Wellness Visit. Additional risk factors are reflected in the social history.  Cardiac Risk Factors include: advanced age (>77men, >47 women);dyslipidemia;obesity (BMI >30kg/m2)   Sleep patterns: Sleeps 6-7 hours, takes Melatonin most nights.  Home Safety/Smoke Alarms: Feels safe in home. Smoke alarms in place.  Living environment; residence and Firearm Safety: Lives alone in 1 story home with basement.  Seat Belt Safety/Bike Helmet: Wears seat belt.   Female:   Pap-N/A      Mammo-11/03/2016, normal (ultrasound Left)       Dexa scan-04/23/2015, normal. Ordered today, will complete with mammo in May 2019.     CCS-Colonoscopy 10/20/2013, Diverticulosis. Recall 5 years.      Objective:     Vitals: BP (!) 144/88 (BP Location: Left Arm, Patient Position: Sitting, Cuff Size: Large)   Pulse 84   Ht 5\' 2"  (1.575 m)   Wt 198 lb 3.2 oz (89.9 kg)   SpO2 98%   BMI 36.25 kg/m   Body mass index is 36.25 kg/m.  Advanced Directives 06/09/2017  Does Patient Have a Medical Advance Directive? Yes  Type of Paramedic of [place];Living will  Copy of [place] in Chart? Yes    Tobacco Social History   Tobacco Use  Smoking Status Never Smoker  Smokeless Tobacco Never Used     Counseling given: Not Answered    Past Medical History:  Diagnosis Date  . Hyperlipidemia   . Hypothyroidism    Past Surgical History:  Procedure Laterality Date  . KNEE ARTHROSCOPY    . PARTIAL HYSTERECTOMY    . TONSILLECTOMY    . TUBAL LIGATION     Family History  Problem Relation Age of Onset  . Colon cancer Father   . Cancer Father        skin  . Cancer Paternal Grandfather        skin  . Pancreatic cancer Neg Hx   . Stomach cancer Neg Hx    Social History   Socioeconomic History  . Marital  status: Married    Spouse name: None  . Number of children: None  . Years of education: None  . Highest education level: None  Social Needs  . Financial resource strain: None  . Food insecurity - worry: None  . Food insecurity - inability: None  . Transportation needs - medical: None  . Transportation needs - non-medical: None  Occupational History  . None  Tobacco Use  . Smoking status: Never Smoker  . Smokeless tobacco: Never Used  Substance and Sexual Activity  . Alcohol use: No  . Drug use: No  . Sexual activity: None  Other Topics Concern  . None  Social History Narrative  . None    Outpatient Encounter Medications as of 06/09/2017  Medication Sig  . atorvastatin (LIPITOR) 40 MG tablet TAKE 1 TABLET(40 MG) BY MOUTH DAILY  . cyclobenzaprine (FLEXERIL) 10 MG tablet Take 1 tablet (10 mg total) at bedtime by mouth.  . fluticasone (FLONASE) 50 MCG/ACT nasal spray SHAKE LIQUID AND USE 2 SPRAYS IN EACH NOSTRIL DAILY  . furosemide (LASIX) 40 MG tablet Take 1 tablet (40 mg total) by mouth daily.  Marland Kitchen levothyroxine (SYNTHROID, LEVOTHROID) 100 MCG tablet TAKE 1 TABLET BY MOUTH EVERY DAY  . meloxicam (MOBIC) 15 MG tablet Take 1 tablet (15  mg total) daily by mouth.  . Multiple Vitamin (MULTIVITAMIN) tablet Take 1 tablet by mouth daily.  . Probiotic Product (PROBIOTIC-10) CHEW Chew by mouth.  . promethazine (PHENERGAN) 25 MG tablet Take 25 mg by mouth every 6 (six) hours as needed for nausea or vomiting.  Marland Kitchen Zoster Vaccine Adjuvanted [facility]) injection Inject 0.5 mLs into the muscle once for 1 dose.  . [DISCONTINUED] fluticasone (FLONASE) 50 MCG/ACT nasal spray Place 2 sprays into both nostrils daily.  . [DISCONTINUED] levothyroxine (SYNTHROID, LEVOTHROID) 100 MCG tablet TAKE 1 TABLET BY MOUTH EVERY DAY   No facility-administered encounter medications on file as of 06/09/2017.     Activities of Daily Living In your present state of health, do you have any difficulty performing the  following activities: 06/09/2017 04/15/2017  Hearing? N N  Vision? N N  Difficulty concentrating or making decisions? N N  Walking or climbing stairs? N N  Dressing or bathing? N N  Doing errands, shopping? N N  Preparing Food and eating ? N -  Using the Toilet? N -  In the past six months, have you accidently leaked urine? N -  Do you have problems with loss of bowel control? N -  Managing your Medications? N -  Managing your Finances? N -  Housekeeping or managing your Housekeeping? N -  Some recent data might be hidden    Patient Care Team: [name], MD as PCP - General (Family Medicine) Lavonna Monarch, MD as Consulting Physician (Dermatology) Inda Castle, MD (Inactive) as Consulting Physician (Gastroenterology)    Assessment:   This is a routine wellness examination for Lynn [name].  Exercise Activities and Dietary recommendations Current Exercise Habits: Home exercise routine, Type of exercise: walking, Time (Minutes): 45, Frequency (Times/Week): 3, Weekly Exercise (Minutes/Week): 135, Exercise limited by: None identified   Diet (meal preparation, eat out, water intake, caffeinated beverages, dairy products, fruits and vegetables): Drinks water, coffee and diet soda.   Rarely eats 3 meals/day, snacks. Discussed heart healthy diet.   Goals    . Weight (lb) < 178 lb (80.7 kg)     Lose weight by decreasing caloric intake and increase activity.        Fall Risk Fall Risk  06/09/2017 04/15/2017 02/02/2017 11/05/2016 04/22/2016  Falls in the past year? Yes No No No No  Number falls in past yr: 1 - - - -  Injury with Fall? No - - - -  Follow up Falls prevention discussed - - - -    Depression Screen PHQ 2/9 Scores 06/09/2017 04/15/2017 02/02/2017 11/05/2016  PHQ - 2 Score 0 0 0 0  PHQ- 9 Score - 0 0 -     Cognitive Function       Ad8 score reviewed for issues:  Issues making decisions: no  Less interest in hobbies / activities:no  Repeats questions, stories  (family complaining): no  Trouble using ordinary gadgets (microwave, computer, phone): no  Forgets the month or year: no  Mismanaging finances: no  Remembering appts: no  Daily problems with thinking and/or memory: no Ad8 score is=0     Immunization History  Administered Date(s) Administered  . Influenza, Seasonal, Injecte, Preservative Fre 03/08/2012, 03/13/2013, 03/15/2014, 03/19/2015  . Influenza,inj,Quad PF,6+ Mos 04/02/2016, 02/02/2017  . Pneumococcal Conjugate-13 10/24/2015  . Tdap 10/24/2011  . Zoster 10/24/2014    Screening Tests Health Maintenance  Topic Date Due  . PNA vac Low Risk Adult (2 of 2 - PPSV23) 09/06/2017 (Originally 10/23/2016)  .  Hepatitis C Screening  04/15/2018 (Originally 05-13-1951)  . MAMMOGRAM  11/03/2017  . COLONOSCOPY  10/21/2018  . TETANUS/TDAP  10/23/2021  . INFLUENZA VACCINE  Completed  . DEXA SCAN  Completed        Plan:     Shingles vaccine at pharmacy.   Schedule bone scan with mammogram after May 2019.   Continue doing brain stimulating activities (puzzles, reading, adult coloring books, staying active) to keep memory sharp.   I have personally reviewed and noted the following in the patient's chart:   . Medical and social history . Use of alcohol, tobacco or illicit drugs  . Current medications and supplements . Functional ability and status . Nutritional status . Physical activity . Advanced directives . List of other physicians . Hospitalizations, surgeries, and ER visits in previous 12 months . Vitals . Screenings to include cognitive, depression, and falls . Referrals and appointments  In addition, I have reviewed and discussed with patient certain preventive protocols, quality metrics, and best practice recommendations. A written personalized care plan for preventive services as well as general preventive health recommendations were provided to patient.     Gerilyn Nestle, RN  06/09/2017  PCP Notes: -BP  today 144/88, pt states normal readings at home. Advised to monitor BP at home, bring readings to next appt. If consistently high or symptomatic, call for appt.  -Next f/u with PCP 10/2017.   Reviewed documentation provided by RN and agree w/ above.  Will address any BP issues at f/u appt.  Annye Asa, MD

## 2017-06-09 ENCOUNTER — Other Ambulatory Visit: Payer: Self-pay

## 2017-06-09 ENCOUNTER — Ambulatory Visit (INDEPENDENT_AMBULATORY_CARE_PROVIDER_SITE_OTHER): Payer: Medicare Other

## 2017-06-09 VITALS — BP 146/88 | HR 84 | Ht 62.0 in | Wt 198.2 lb

## 2017-06-09 DIAGNOSIS — E2839 Other primary ovarian failure: Secondary | ICD-10-CM

## 2017-06-09 DIAGNOSIS — Z23 Encounter for immunization: Secondary | ICD-10-CM

## 2017-06-09 DIAGNOSIS — Z0000 Encounter for general adult medical examination without abnormal findings: Secondary | ICD-10-CM

## 2017-06-09 MED ORDER — ZOSTER VAC RECOMB ADJUVANTED 50 MCG/0.5ML IM SUSR: 0.5000 mL | Freq: Once | INTRAMUSCULAR | 1 refills | Status: AC

## 2017-06-09 NOTE — Patient Instructions (Addendum)
Shingles vaccine at pharmacy.   Schedule bone scan with mammogram after May 2019.   Continue doing brain stimulating activities (puzzles, reading, adult coloring books, staying active) to keep memory sharp.    Health Maintenance, Female Adopting a healthy lifestyle and getting preventive care can go a long way to promote health and wellness. Talk with your health care provider about what schedule of regular examinations is right for you. This is a good chance for you to check in with your provider about disease prevention and staying healthy. In between checkups, there are plenty of things you can do on your own. Experts have done a lot of research about which lifestyle changes and preventive measures are most likely to keep you healthy. Ask your health care provider for more information. Weight and diet Eat a healthy diet  Be sure to include plenty of vegetables, fruits, low-fat dairy products, and lean protein.  Do not eat a lot of foods high in solid fats, added sugars, or salt.  Get regular exercise. This is one of the most important things you can do for your health. ? Most adults should exercise for at least 150 minutes each week. The exercise should increase your heart rate and make you sweat (moderate-intensity exercise). ? Most adults should also do strengthening exercises at least twice a week. This is in addition to the moderate-intensity exercise.  Maintain a healthy weight  Body mass index (BMI) is a measurement that can be used to identify possible weight problems. It estimates body fat based on height and weight. Your health care provider can help determine your BMI and help you achieve or maintain a healthy weight.  For females 64 years of age and older: ? A BMI below 18.5 is considered underweight. ? A BMI of 18.5 to 24.9 is normal. ? A BMI of 25 to 29.9 is considered overweight. ? A BMI of 30 and above is considered obese.  Watch levels of cholesterol and blood  lipids  You should start having your blood tested for lipids and cholesterol at 67 years of age, then have this test every 5 years.  You may need to have your cholesterol levels checked more often if: ? Your lipid or cholesterol levels are high. ? You are older than 67 years of age. ? You are at high risk for heart disease.  Cancer screening Lung Cancer  Lung cancer screening is recommended for adults 50-60 years old who are at high risk for lung cancer because of a history of smoking.  A yearly low-dose CT scan of the lungs is recommended for people who: ? Currently smoke. ? Have quit within the past 15 years. ? Have at least a 30-pack-year history of smoking. A pack year is smoking an average of one pack of cigarettes a day for 1 year.  Yearly screening should continue until it has been 15 years since you quit.  Yearly screening should stop if you develop a health problem that would prevent you from having lung cancer treatment.  Breast Cancer  Practice breast self-awareness. This means understanding how your breasts normally appear and feel.  It also means doing regular breast self-exams. Let your health care provider know about any changes, no matter how small.  If you are in your 20s or 30s, you should have a clinical breast exam (CBE) by a health care provider every 1-3 years as part of a regular health exam.  If you are 34 or older, have a CBE every year.  year. Also consider having a breast X-ray (mammogram) every year.  If you have a family history of breast cancer, talk to your health care provider about genetic screening.  If you are at high risk for breast cancer, talk to your health care provider about having an MRI and a mammogram every year.  Breast cancer gene (BRCA) assessment is recommended for women who have family members with BRCA-related cancers. BRCA-related cancers include: ? Breast. ? Ovarian. ? Tubal. ? Peritoneal cancers.  Results of the assessment  will determine the need for genetic counseling and BRCA1 and BRCA2 testing.  Cervical Cancer Your health care provider may recommend that you be screened regularly for cancer of the pelvic organs (ovaries, uterus, and vagina). This screening involves a pelvic examination, including checking for microscopic changes to the surface of your cervix (Pap test). You may be encouraged to have this screening done every 3 years, beginning at age 21.  For women ages 30-65, health care providers may recommend pelvic exams and Pap testing every 3 years, or they may recommend the Pap and pelvic exam, combined with testing for human papilloma virus (HPV), every 5 years. Some types of HPV increase your risk of cervical cancer. Testing for HPV may also be done on women of any age with unclear Pap test results.  Other health care providers may not recommend any screening for nonpregnant women who are considered low risk for pelvic cancer and who do not have symptoms. Ask your health care provider if a screening pelvic exam is right for you.  If you have had past treatment for cervical cancer or a condition that could lead to cancer, you need Pap tests and screening for cancer for at least 20 years after your treatment. If Pap tests have been discontinued, your risk factors (such as having a new sexual partner) need to be reassessed to determine if screening should resume. Some women have medical problems that increase the chance of getting cervical cancer. In these cases, your health care provider may recommend more frequent screening and Pap tests.  Colorectal Cancer  This type of cancer can be detected and often prevented.  Routine colorectal cancer screening usually begins at 67 years of age and continues through 67 years of age.  Your health care provider may recommend screening at an earlier age if you have risk factors for colon cancer.  Your health care provider may also recommend using home test kits to  check for hidden blood in the stool.  A small camera at the end of a tube can be used to examine your colon directly (sigmoidoscopy or colonoscopy). This is done to check for the earliest forms of colorectal cancer.  Routine screening usually begins at age 50.  Direct examination of the colon should be repeated every 5-10 years through 67 years of age. However, you may need to be screened more often if early forms of precancerous polyps or small growths are found.  Skin Cancer  Check your skin from head to toe regularly.  Tell your health care provider about any new moles or changes in moles, especially if there is a change in a mole's shape or color.  Also tell your health care provider if you have a mole that is larger than the size of a pencil eraser.  Always use sunscreen. Apply sunscreen liberally and repeatedly throughout the day.  Protect yourself by wearing long sleeves, pants, a wide-brimmed hat, and sunglasses whenever you are outside.  Heart disease, diabetes, and   high blood pressure  High blood pressure causes heart disease and increases the risk of stroke. High blood pressure is more likely to develop in: ? People who have blood pressure in the high end of the normal range (130-139/85-89 mm Hg). ? People who are overweight or obese. ? People who are African American.  If you are 18-39 years of age, have your blood pressure checked every 3-5 years. If you are 40 years of age or older, have your blood pressure checked every year. You should have your blood pressure measured twice-once when you are at a hospital or clinic, and once when you are not at a hospital or clinic. Record the average of the two measurements. To check your blood pressure when you are not at a hospital or clinic, you can use: ? An automated blood pressure machine at a pharmacy. ? A home blood pressure monitor.  If you are between 55 years and 79 years old, ask your health care provider if you should  take aspirin to prevent strokes.  Have regular diabetes screenings. This involves taking a blood sample to check your fasting blood sugar level. ? If you are at a normal weight and have a low risk for diabetes, have this test once every three years after 67 years of age. ? If you are overweight and have a high risk for diabetes, consider being tested at a younger age or more often. Preventing infection Hepatitis B  If you have a higher risk for hepatitis B, you should be screened for this virus. You are considered at high risk for hepatitis B if: ? You were born in a country where hepatitis B is common. Ask your health care provider which countries are considered high risk. ? Your parents were born in a high-risk country, and you have not been immunized against hepatitis B (hepatitis B vaccine). ? You have HIV or AIDS. ? You use needles to inject street drugs. ? You live with someone who has hepatitis B. ? You have had sex with someone who has hepatitis B. ? You get hemodialysis treatment. ? You take certain medicines for conditions, including cancer, organ transplantation, and autoimmune conditions.  Hepatitis C  Blood testing is recommended for: ? Everyone born from 1945 through 1965. ? Anyone with known risk factors for hepatitis C.  Sexually transmitted infections (STIs)  You should be screened for sexually transmitted infections (STIs) including gonorrhea and chlamydia if: ? You are sexually active and are younger than 67 years of age. ? You are older than 67 years of age and your health care provider tells you that you are at risk for this type of infection. ? Your sexual activity has changed since you were last screened and you are at an increased risk for chlamydia or gonorrhea. Ask your health care provider if you are at risk.  If you do not have HIV, but are at risk, it may be recommended that you take a prescription medicine daily to prevent HIV infection. This is called  pre-exposure prophylaxis (PrEP). You are considered at risk if: ? You are sexually active and do not regularly use condoms or know the HIV status of your partner(s). ? You take drugs by injection. ? You are sexually active with a partner who has HIV.  Talk with your health care provider about whether you are at high risk of being infected with HIV. If you choose to begin PrEP, you should first be tested for HIV. You should then be   tested every 3 months for as long as you are taking PrEP. Pregnancy  If you are premenopausal and you may become pregnant, ask your health care provider about preconception counseling.  If you may become pregnant, take 400 to 800 micrograms (mcg) of folic acid every day.  If you want to prevent pregnancy, talk to your health care provider about birth control (contraception). Osteoporosis and menopause  Osteoporosis is a disease in which the bones lose minerals and strength with aging. This can result in serious bone fractures. Your risk for osteoporosis can be identified using a bone density scan.  If you are 65 years of age or older, or if you are at risk for osteoporosis and fractures, ask your health care provider if you should be screened.  Ask your health care provider whether you should take a calcium or vitamin D supplement to lower your risk for osteoporosis.  Menopause may have certain physical symptoms and risks.  Hormone replacement therapy may reduce some of these symptoms and risks. Talk to your health care provider about whether hormone replacement therapy is right for you. Follow these instructions at home:  Schedule regular health, dental, and eye exams.  Stay current with your immunizations.  Do not use any tobacco products including cigarettes, chewing tobacco, or electronic cigarettes.  If you are pregnant, do not drink alcohol.  If you are breastfeeding, limit how much and how often you drink alcohol.  Limit alcohol intake to no more  than 1 drink per day for nonpregnant women. One drink equals 12 ounces of beer, 5 ounces of wine, or 1 ounces of hard liquor.  Do not use street drugs.  Do not share needles.  Ask your health care provider for help if you need support or information about quitting drugs.  Tell your health care provider if you often feel depressed.  Tell your health care provider if you have ever been abused or do not feel safe at home. This information is not intended to replace advice given to you by your health care provider. Make sure you discuss any questions you have with your health care provider. Document Released: 12/08/2010 Document Revised: 10/31/2015 Document Reviewed: 02/26/2015 Elsevier Interactive Patient Education  2018 [employer]  

## 2017-06-28 ENCOUNTER — Ambulatory Visit (INDEPENDENT_AMBULATORY_CARE_PROVIDER_SITE_OTHER): Payer: Medicare Other | Admitting: Family Medicine

## 2017-06-28 ENCOUNTER — Encounter: Payer: Self-pay | Admitting: Family Medicine

## 2017-06-28 VITALS — BP 120/84 | HR 84 | Temp 98.2°F | Ht 62.0 in | Wt 198.0 lb

## 2017-06-28 DIAGNOSIS — J0100 Acute maxillary sinusitis, unspecified: Secondary | ICD-10-CM | POA: Diagnosis not present

## 2017-06-28 MED ORDER — AMOXICILLIN-POT CLAVULANATE 875-125 MG PO TABS: 1.0000 | ORAL_TABLET | Freq: Two times a day (BID) | ORAL | 0 refills | Status: AC

## 2017-06-28 NOTE — Patient Instructions (Signed)

## 2017-06-28 NOTE — Progress Notes (Signed)
Subjective   CC:  Chief Complaint  Patient presents with  . Sinus Problem    HPI: NESA DISTEL is a 67 y.o. female who presents to the office today to address the problems listed above in the chief complaint.  Patient reports sinus congestion and pressure with thick drainage, mild nonproductive cough, ear pressure without pain, and mild malaise.  Symptoms have been present for several days.Shedenies high fevers, GI symptoms, shortness of breath. Shehas had sinus infections in the past and this feels similar, although it has been many years.  She recently returned home from a trip to [place]..  Patient is a non-smoker.  No history of asthma or COPD.  I reviewed the patients updated PMH, FH, and SocHx.    Patient Active Problem List   Diagnosis Date Noted  . Elevated BP without diagnosis of hypertension 11/05/2016  . Physical exam 04/22/2016  . Hypothyroidism 10/24/2015  . Hyperlipidemia 10/24/2015  . Obesity (BMI 35.0-39.9 without comorbidity) 10/24/2015   Current Meds  Medication Sig  . atorvastatin (LIPITOR) 40 MG tablet TAKE 1 TABLET(40 MG) BY MOUTH DAILY  . cyclobenzaprine (FLEXERIL) 10 MG tablet Take 1 tablet (10 mg total) at bedtime by mouth.  . fluticasone (FLONASE) 50 MCG/ACT nasal spray SHAKE LIQUID AND USE 2 SPRAYS IN EACH NOSTRIL DAILY  . furosemide (LASIX) 40 MG tablet Take 1 tablet (40 mg total) by mouth daily.  Marland Kitchen levothyroxine (SYNTHROID, LEVOTHROID) 100 MCG tablet TAKE 1 TABLET BY MOUTH EVERY DAY  . MELATONIN PO Take 1 tablet by mouth at bedtime as needed.  . Multiple Vitamin (MULTIVITAMIN) tablet Take 1 tablet by mouth daily.  . Probiotic Product (PROBIOTIC-10) CHEW Chew by mouth.  . promethazine (PHENERGAN) 25 MG tablet Take 25 mg by mouth every 6 (six) hours as needed for nausea or vomiting.    Review of Systems: Cardiovascular: negative for chest pain Respiratory: negative for SOB or persistent cough Gastrointestinal: negative for abdominal  pain Genitourinary: negative for dysuria or gross hematuria  Objective  Vitals: BP 120/84 (BP Location: Left Arm, Patient Position: Sitting, Cuff Size: Large)   Pulse 84   Temp 98.2 F (36.8 C) (Oral)   Ht 5\' 2"  (1.575 m)   Wt 198 lb (89.8 kg)   SpO2 95%   BMI 36.21 kg/m  General: no acute distress  Psych:  Alert and oriented, normal mood and affect HEENT:  Normocephalic, atraumatic, TMs with serous effusions or retraction w/o erythema, nasal mucosa is red with purulent drainage, tender maxillary sinus present, OP mild erythematous w/o eudate, supple neck with LAD Cardiovascular:  RRR without murmur or gallop. no peripheral edema Respiratory:  Good breath sounds bilaterally, CTAB with normal respiratory effort Skin:  Warm, no rashes Neurologic:   Mental status is normal. normal gait  Assessment  1. Acute non-recurrent maxillary sinusitis      Plan    Sinusitis: History and exam is most consistent with bacterial sinus infection.  Etiology and prognosis discussed with patient.  Recommend antibiotics as ordered below.  Patient to complete course of antibiotics, use supportive medications like mucolytics and decongestants as needed.  May use Tylenol or Advil if needed.  Symptoms should improve over the next 2 weeks.  Patient will return or call if symptoms persist or worsen.  Follow up: As needed   Commons side effects, risks, benefits, and alternatives for medications and treatment plan prescribed today were discussed, and the patient expressed understanding of the given instructions. Patient is instructed to call or  message via MyChart if he/she has any questions or concerns regarding our treatment plan. No barriers to understanding were identified. We discussed Red Flag symptoms and signs in detail. Patient expressed understanding regarding what to do in case of urgent or emergency type symptoms.   Medication list was reconciled, printed and provided to the patient in AVS. Patient  instructions and summary information was reviewed with the patient as documented in the AVS. This note was prepared with assistance of Dragon voice recognition software. Occasional wrong-word or sound-a-like substitutions may have occurred due to the inherent limitations of voice recognition software  No orders of the defined types were placed in this encounter.  Meds ordered this encounter  Medications  . amoxicillin-clavulanate (AUGMENTIN) 875-125 MG tablet    Sig: Take 1 tablet by mouth 2 (two) times daily for 10 days.    Dispense:  20 tablet    Refill:  0

## 2017-07-01 ENCOUNTER — Telehealth: Payer: Self-pay | Admitting: Family Medicine

## 2017-07-01 MED ORDER — FLUCONAZOLE 150 MG PO TABS: ORAL_TABLET | ORAL | 0 refills | Status: DC

## 2017-07-01 NOTE — Telephone Encounter (Signed)
Dr Jonni Sanger- see message and please advice

## 2017-07-01 NOTE — Telephone Encounter (Signed)
Copied from [place] [id number]. Topic: General - Other >> Jul 01, 2017  2:06 PM Lolita Rieger, RMA wrote: Reason for CRM: pt called and requested medication for a yeast infection because she said that she always gets a yeast infection when she takes an antibiotic

## 2017-07-01 NOTE — Telephone Encounter (Signed)
Spoke with Patient, informed that Rx was sent to [facility]. Patient verbalized understanding.

## 2017-07-01 NOTE — Telephone Encounter (Signed)
Please notify pt; sent in RX for diflucan.

## 2017-09-22 ENCOUNTER — Other Ambulatory Visit: Payer: Self-pay

## 2017-09-22 ENCOUNTER — Ambulatory Visit (INDEPENDENT_AMBULATORY_CARE_PROVIDER_SITE_OTHER): Payer: Medicare Other | Admitting: Family Medicine

## 2017-09-22 ENCOUNTER — Encounter: Payer: Self-pay | Admitting: Family Medicine

## 2017-09-22 VITALS — BP 121/90 | HR 82 | Temp 98.2°F | Resp 16 | Ht 62.0 in | Wt 199.4 lb

## 2017-09-22 DIAGNOSIS — M25562 Pain in left knee: Secondary | ICD-10-CM

## 2017-09-22 MED ORDER — MELOXICAM 15 MG PO TABS: 15.0000 mg | ORAL_TABLET | Freq: Every day | ORAL | 0 refills | Status: DC

## 2017-09-22 NOTE — Patient Instructions (Signed)
Follow up as needed or as scheduled START the Meloxicam once daily- take w/ food ICE!! If no improvement by early next week, let me know and we can refer you to Sports Med Call with any questions or concerns Hang in there!!!

## 2017-09-22 NOTE — Progress Notes (Signed)
   Subjective:    Patient ID: Lynn [name], female    DOB: Feb 11, 1951, 67 y.o.   MRN: [id number]  HPI L knee pain- walking down the stairs quickly on Sunday night when she felt a 'pop' in the back of her L knee.  Pt was able to hear the 'pop' as well as feel it.  + swelling of L knee.  Pt has continued pain w/ stairs.  Pain is somewhat better since Sunday but continues.  Pt is able to stand comfortably and walk w/o difficulty.   Review of Systems For ROS see HPI     Objective:   Physical Exam  Constitutional: She is oriented to person, place, and time. She appears well-developed and well-nourished. No distress.  obese  Musculoskeletal: She exhibits edema (swelling of L anteromedial knee) and tenderness (TTP over L anteromedial knee consistent w/ inflammation of bursa, pain over popliteal fossa). She exhibits no deformity.  Pain in popliteal fossa w/ full extension of L knee  Neurological: She is alert and oriented to person, place, and time.  Vitals reviewed.         Assessment & Plan:  L knee pain- new.  Pt stepped down and heard/felt a pop behind L knee.  Suspect she ruptured a Baker's Cyst as her pain has already improved and she is now only having pain w/ stairs.  Hx and PE not consistent w/ meniscal or PCL injury.  Reviewed supportive care and red flags that should prompt return.  If no improvement, will refer to Sports Med.  Pt expressed understanding and is in agreement w/ plan.

## 2017-09-25 DIAGNOSIS — B379 Candidiasis, unspecified: Secondary | ICD-10-CM | POA: Diagnosis not present

## 2017-09-25 DIAGNOSIS — H66001 Acute suppurative otitis media without spontaneous rupture of ear drum, right ear: Secondary | ICD-10-CM | POA: Diagnosis not present

## 2017-09-25 DIAGNOSIS — J029 Acute pharyngitis, unspecified: Secondary | ICD-10-CM | POA: Diagnosis not present

## 2017-09-25 DIAGNOSIS — Z0131 Encounter for examination of blood pressure with abnormal findings: Secondary | ICD-10-CM | POA: Diagnosis not present

## 2017-09-25 DIAGNOSIS — T3695XA Adverse effect of unspecified systemic antibiotic, initial encounter: Secondary | ICD-10-CM | POA: Diagnosis not present

## 2017-10-01 ENCOUNTER — Ambulatory Visit (INDEPENDENT_AMBULATORY_CARE_PROVIDER_SITE_OTHER): Payer: Medicare Other

## 2017-10-01 ENCOUNTER — Other Ambulatory Visit: Payer: Self-pay

## 2017-10-01 ENCOUNTER — Ambulatory Visit (INDEPENDENT_AMBULATORY_CARE_PROVIDER_SITE_OTHER): Payer: Medicare Other | Admitting: Physician Assistant

## 2017-10-01 ENCOUNTER — Encounter: Payer: Self-pay | Admitting: Physician Assistant

## 2017-10-01 ENCOUNTER — Other Ambulatory Visit: Payer: Self-pay | Admitting: Physician Assistant

## 2017-10-01 VITALS — BP 124/84 | HR 85 | Temp 98.6°F | Resp 14 | Ht 62.0 in | Wt 197.0 lb

## 2017-10-01 DIAGNOSIS — M25562 Pain in left knee: Secondary | ICD-10-CM

## 2017-10-01 DIAGNOSIS — M1712 Unilateral primary osteoarthritis, left knee: Secondary | ICD-10-CM | POA: Diagnosis not present

## 2017-10-01 NOTE — Progress Notes (Signed)
Patient presents to clinic today c/o continued pain of left knee. Patient was seen and evaluated by her PCP on 09/22/17 for the same complaint. At that time it was felt to potentially be a ruptured baker's cyst. Patient was instructed to Ice and start Meloxicam. Patient endorses symptoms are worsening. Endorses initial improvement but with ambulating this week, symptoms have worsened. Still noting medial swelling. Denies redness, numbness/tingling. Denies weakness/buckling of knee. Ice has been very helpful per patient.    Past Medical History:  Diagnosis Date  . Hyperlipidemia   . Hypothyroidism     Current Outpatient Medications on File Prior to Visit  Medication Sig Dispense Refill  . atorvastatin (LIPITOR) 40 MG tablet TAKE 1 TABLET(40 MG) BY MOUTH DAILY 90 tablet 1  . fluticasone (FLONASE) 50 MCG/ACT nasal spray SHAKE LIQUID AND USE 2 SPRAYS IN EACH NOSTRIL DAILY 16 g 3  . furosemide (LASIX) 40 MG tablet Take 1 tablet (40 mg total) by mouth daily. 30 tablet 2  . levothyroxine (SYNTHROID, LEVOTHROID) 100 MCG tablet TAKE 1 TABLET BY MOUTH EVERY DAY 90 tablet 0  . MELATONIN PO Take 1 tablet by mouth at bedtime as needed.    . meloxicam (MOBIC) 15 MG tablet Take 1 tablet (15 mg total) by mouth daily. 30 tablet 0  . Multiple Vitamin (MULTIVITAMIN) tablet Take 1 tablet by mouth daily.    . Probiotic Product (PROBIOTIC-10) CHEW Chew by mouth.    . promethazine (PHENERGAN) 25 MG tablet Take 25 mg by mouth every 6 (six) hours as needed for nausea or vomiting.     No current facility-administered medications on file prior to visit.     Allergies  Allergen Reactions  . Sulfa Antibiotics Nausea Only  . Brimonidine Tartrate     Family History  Problem Relation Age of Onset  . Colon cancer Father   . Cancer Father        skin  . Cancer Paternal Grandfather        skin  . Pancreatic cancer Neg Hx   . Stomach cancer Neg Hx     Social History   Socioeconomic History  . Marital  status: Married    Spouse name: Not on file  . Number of children: Not on file  . Years of education: Not on file  . Highest education level: Not on file  Occupational History  . Not on file  Social Needs  . Financial resource strain: Not on file  . Food insecurity:    Worry: Not on file    Inability: Not on file  . Transportation needs:    Medical: Not on file    Non-medical: Not on file  Tobacco Use  . Smoking status: Never Smoker  . Smokeless tobacco: Never Used  Substance and Sexual Activity  . Alcohol use: No  . Drug use: No  . Sexual activity: Not on file  Lifestyle  . Physical activity:    Days per week: Not on file    Minutes per session: Not on file  . Stress: Not on file  Relationships  . Social connections:    Talks on phone: Not on file    Gets together: Not on file    Attends religious service: Not on file    Active member of club or organization: Not on file    Attends meetings of clubs or organizations: Not on file    Relationship status: Not on file  Other Topics Concern  . Not on  file  Social History Narrative  . Not on file   Review of Systems - See HPI.  All other ROS are negative.  BP 124/84   Pulse 85   Temp 98.6 F (37 C) (Oral)   Resp 14   Ht 5\' 2"  (1.575 m)   Wt 197 lb (89.4 kg)   SpO2 98%   BMI 36.03 kg/m   Physical Exam  Constitutional: She appears well-developed and well-nourished.  HENT:  Head: Normocephalic and atraumatic.  Eyes: Conjunctivae are normal.  Neck: Neck supple.  Cardiovascular: Normal rate, regular rhythm and normal heart sounds.  Pulmonary/Chest: Effort normal and breath sounds normal. No stridor. No respiratory distress. She has no wheezes. She has no rales. She exhibits no tenderness.  Musculoskeletal:       Left knee: She exhibits swelling. She exhibits normal range of motion. Tenderness found. Medial joint line tenderness noted. No lateral joint line, no MCL, no LCL and no patellar tendon tenderness noted.    Lymphadenopathy:    She has no cervical adenopathy.  Neurological: She is alert.  Nursing note and vitals reviewed.  Assessment/Plan: 1. Acute pain of left knee Worsening. Medial pain and swelling. Will obtain imaging today due to ongoing and worsening symptoms. Restart meloxicam. Ice and elevate. Knee sleeve recommended. Will set up with Sports Med pending imaging results.   - DG Knee Complete 4 Views Left; Future   Leeanne Rio, PA-C

## 2017-10-01 NOTE — Patient Instructions (Signed)
Please go to the [facility] office at [facility] for imaging. I will call with your results.   Please keep knee iced and elevated. Continue the Melocxicam once daily.  Use Tylenol arthritis for breakthrough pain.  Get a knee sleeve at the pharmacy to wear as compression will help with pain.   We will alter regimen according to results.  If negative, I will be setting you up with Dr. Paulla Fore (Sports Medicine) for further assessment.

## 2017-10-06 ENCOUNTER — Ambulatory Visit (INDEPENDENT_AMBULATORY_CARE_PROVIDER_SITE_OTHER): Payer: Medicare Other | Admitting: Family Medicine

## 2017-10-06 ENCOUNTER — Other Ambulatory Visit: Payer: Self-pay

## 2017-10-06 ENCOUNTER — Encounter: Payer: Self-pay | Admitting: Family Medicine

## 2017-10-06 VITALS — BP 136/88 | HR 51 | Temp 98.6°F | Resp 16 | Ht 62.0 in | Wt 197.6 lb

## 2017-10-06 DIAGNOSIS — R030 Elevated blood-pressure reading, without diagnosis of hypertension: Secondary | ICD-10-CM | POA: Diagnosis not present

## 2017-10-06 DIAGNOSIS — E038 Other specified hypothyroidism: Secondary | ICD-10-CM | POA: Diagnosis not present

## 2017-10-06 DIAGNOSIS — E785 Hyperlipidemia, unspecified: Secondary | ICD-10-CM | POA: Diagnosis not present

## 2017-10-06 LAB — BASIC METABOLIC PANEL
BUN: 14 mg/dL (ref 6–23)
CO2: 31 mEq/L (ref 19–32)
Calcium: 9.2 mg/dL (ref 8.4–10.5)
Chloride: 104 mEq/L (ref 96–112)
Creatinine, Ser: 0.99 mg/dL (ref 0.40–1.20)
GFR: 59.42 mL/min — AB (ref >60.00)
Glucose, Bld: 103 mg/dL — ABNORMAL HIGH (ref 70–99)
POTASSIUM: 4.5 meq/L (ref 3.5–5.1)
SODIUM: 144 meq/L (ref 135–145)

## 2017-10-06 LAB — CBC WITH DIFFERENTIAL/PLATELET
Basophils Absolute: 0.1 10*3/uL (ref 0.0–0.1)
Basophils Relative: 1.3 % (ref 0.0–3.0)
Eosinophils Absolute: 0.2 10*3/uL (ref 0.0–0.7)
Eosinophils Relative: 2.1 % (ref 0.0–5.0)
HCT: 42.8 % (ref 36.0–46.0)
HEMOGLOBIN: 14.6 g/dL (ref 12.0–15.0)
LYMPHS ABS: 2.1 10*3/uL (ref 0.7–4.0)
Lymphocytes Relative: 27.6 % (ref 12.0–46.0)
MCHC: 34.2 g/dL (ref 30.0–36.0)
MCV: 85.8 fl (ref 78.0–100.0)
MONO ABS: 0.6 10*3/uL (ref 0.1–1.0)
Monocytes Relative: 7.6 % (ref 3.0–12.0)
NEUTROS PCT: 61.4 % (ref 43.0–77.0)
Neutro Abs: 4.7 10*3/uL (ref 1.4–7.7)
Platelets: 259 10*3/uL (ref 150.0–400.0)
RBC: 4.98 Mil/uL (ref 3.87–5.11)
RDW: 12.8 % (ref 11.5–15.5)
WBC: 7.6 10*3/uL (ref 4.0–10.5)

## 2017-10-06 LAB — TSH: TSH: 5.47 u[IU]/mL — AB (ref 0.35–4.50)

## 2017-10-06 LAB — LIPID PANEL
CHOL/HDL RATIO: 4
CHOLESTEROL: 192 mg/dL (ref 0–200)
HDL: 45.5 mg/dL (ref >39.00)
NONHDL: 146.25
Triglycerides: 224 mg/dL — ABNORMAL HIGH (ref 0.0–149.0)
VLDL: 44.8 mg/dL — AB (ref 0.0–40.0)

## 2017-10-06 LAB — LDL CHOLESTEROL, DIRECT: LDL DIRECT: 105 mg/dL

## 2017-10-06 LAB — HEPATIC FUNCTION PANEL
ALT: 17 U/L (ref 0–35)
AST: 15 U/L (ref 0–37)
Albumin: 3.9 g/dL (ref 3.5–5.2)
Alkaline Phosphatase: 80 U/L (ref 39–117)
BILIRUBIN DIRECT: 0.1 mg/dL (ref 0.0–0.3)
BILIRUBIN TOTAL: 0.6 mg/dL (ref 0.2–1.2)
Total Protein: 6.5 g/dL (ref 6.0–8.3)

## 2017-10-06 NOTE — Assessment & Plan Note (Signed)
BP adequate control.  No need for meds.  Will follow closely.

## 2017-10-06 NOTE — Patient Instructions (Signed)
Follow up in 6 months to recheck cholesterol, thyroid, and BP We'll notify you of your lab results and make any changes if needed Continue to work on healthy diet and regular exercise- you can do it!! Call with any questions or concerns [place]!!!

## 2017-10-06 NOTE — Progress Notes (Signed)
   Subjective:    Patient ID: Lynn [name], female    DOB: 1951/01/08, 67 y.o.   MRN: [id number]  HPI Hyperlipidemia- chronic problem, on Lipitor 40mg  daily.  Not able to exercise due to acute knee pain (appt upcoming).  Denies abd pain, N/V.  Hypothyroid- chronic problem, on Levothyroxine 174mcg daily.  Pt reports occasional constipation.  Denies changes to skin/hair/nails.  No excessive fatigue.  Elevated BP- pt has hx of elevated BPs but has never crossed threshold to HTN.  No CP, SOB, HAs, visual changes.     Review of Systems For ROS see HPI     Objective:   Physical Exam  Constitutional: She is oriented to person, place, and time. She appears well-developed and well-nourished. No distress.  HENT:  Head: Normocephalic and atraumatic.  Eyes: Pupils are equal, round, and reactive to light. Conjunctivae and EOM are normal.  Neck: Normal range of motion. Neck supple. No thyromegaly present.  Cardiovascular: Normal rate, regular rhythm, normal heart sounds and intact distal pulses.  No murmur heard. Pulmonary/Chest: Effort normal and breath sounds normal. No respiratory distress.  Abdominal: Soft. She exhibits no distension. There is no tenderness.  Musculoskeletal: She exhibits no edema.  Lymphadenopathy:    She has no cervical adenopathy.  Neurological: She is alert and oriented to person, place, and time.  Skin: Skin is warm and dry.  Psychiatric: She has a normal mood and affect. Her behavior is normal.  Vitals reviewed.         Assessment & Plan:

## 2017-10-06 NOTE — Assessment & Plan Note (Signed)
Chronic problem.  Pt is currently asymptomatic.  Check labs.  Adjust meds prn  

## 2017-10-06 NOTE — Assessment & Plan Note (Signed)
Ongoing issue for pt.  Tolerating statin w/o difficulty.  Encouraged healthy diet and regular exercise.  Check labs.  Adjust meds prn

## 2017-10-07 ENCOUNTER — Other Ambulatory Visit: Payer: Self-pay | Admitting: Family Medicine

## 2017-10-07 ENCOUNTER — Other Ambulatory Visit: Payer: Self-pay

## 2017-10-07 DIAGNOSIS — Z1231 Encounter for screening mammogram for malignant neoplasm of breast: Secondary | ICD-10-CM

## 2017-10-07 DIAGNOSIS — E2839 Other primary ovarian failure: Secondary | ICD-10-CM

## 2017-10-07 DIAGNOSIS — R7989 Other specified abnormal findings of blood chemistry: Secondary | ICD-10-CM

## 2017-10-07 MED ORDER — LEVOTHYROXINE SODIUM 112 MCG PO TABS: 112.0000 ug | ORAL_TABLET | Freq: Every day | ORAL | 3 refills | Status: DC

## 2017-10-11 ENCOUNTER — Ambulatory Visit: Payer: Medicare Other | Admitting: Sports Medicine

## 2017-10-13 ENCOUNTER — Telehealth: Payer: Self-pay | Admitting: Family Medicine

## 2017-10-13 ENCOUNTER — Ambulatory Visit: Payer: Medicare Other | Admitting: Family Medicine

## 2017-10-13 NOTE — Telephone Encounter (Signed)
Copied from [place] [id number]. Topic: Quick Communication - See Telephone Encounter >> Oct 13, 2017 12:12 PM Vernona Rieger wrote: CRM for notification. See Telephone encounter for: 10/13/17.  furosemide (LASIX) 40 MG tablet  [id number] - [place], [place] - [address]

## 2017-10-14 MED ORDER — FUROSEMIDE 40 MG PO TABS: 40.0000 mg | ORAL_TABLET | Freq: Every day | ORAL | 2 refills | Status: DC

## 2017-10-14 NOTE — Telephone Encounter (Signed)
Ok to fill- pt only takes as needed

## 2017-10-14 NOTE — Telephone Encounter (Signed)
Pt requested refill of Lasix. Last filled on 11/16/16 #30 with 2 refills   LOV: 10/06/17  Dr. Birdie Riddle  [facility] [place]

## 2017-10-14 NOTE — Telephone Encounter (Signed)
Should patient still be on this?  Okay to fill?

## 2017-10-14 NOTE — Telephone Encounter (Signed)
Medication filled to pharmacy as requested.   

## 2017-10-18 ENCOUNTER — Ambulatory Visit: Payer: Self-pay

## 2017-10-18 ENCOUNTER — Ambulatory Visit (INDEPENDENT_AMBULATORY_CARE_PROVIDER_SITE_OTHER): Payer: Medicare Other | Admitting: Sports Medicine

## 2017-10-18 ENCOUNTER — Encounter: Payer: Self-pay | Admitting: Sports Medicine

## 2017-10-18 VITALS — BP 142/100 | HR 82 | Ht 62.0 in | Wt 197.6 lb

## 2017-10-18 DIAGNOSIS — M25562 Pain in left knee: Secondary | ICD-10-CM | POA: Diagnosis not present

## 2017-10-18 DIAGNOSIS — M25462 Effusion, left knee: Secondary | ICD-10-CM | POA: Diagnosis not present

## 2017-10-18 DIAGNOSIS — M1712 Unilateral primary osteoarthritis, left knee: Secondary | ICD-10-CM | POA: Diagnosis not present

## 2017-10-18 DIAGNOSIS — M170 Bilateral primary osteoarthritis of knee: Secondary | ICD-10-CM | POA: Insufficient documentation

## 2017-10-18 NOTE — Progress Notes (Signed)
Lynn [name]. Lynn [name], Lynn [name] - 67 y.o. female MRN [id number]  Date of birth: Jun 21, 1950  Visit Date: 10/18/2017  PCP: [name], MD   Referred by: [name], MD  Scribe for today's visit: Wendy Poet, LAT, ATC     SUBJECTIVE:  Lynn [name] is here for New Patient (Initial Visit) (L knee pain) .  Referred by: Dr. Birdie Riddle Her L ant-med knee pain symptoms INITIALLY: Began on 09/19/17 when she was going downstairs when her L post knee popped Described as moderate aching pain, radiating to the L LE both the lower leg and thigh Worsened with climbing stairs Improved with ice and Meloxicam Additional associated symptoms include: L knee swelling and popping noted    At this time symptoms are improving compared to onset w/ decreased pain and swelling.  She notes 75% improvement She has been wearing OTC knee brace, using ice and taking Meloxicam as prescribed by Dr. Birdie Riddle.  Had L knee XR on 10/01/17.  R knee hx- meniscal tears and MCL sprain  ROS Reports night time disturbances. Reports fevers, chills, or night sweats.  Yes to nt sweats Denies unexplained weight loss. Denies personal history of cancer. Denies changes in bowel or bladder habits. Denies recent unreported falls. Denies new or worsening dyspnea or wheezing. Denies headaches or dizziness.  Denies numbness, tingling or weakness  In the extremities.  Denies dizziness or presyncopal episodes Reports lower extremity edema in the L knee   HISTORY & PERTINENT PRIOR DATA:  Prior History reviewed and updated per electronic medical record.  Significant/pertinent history, findings, studies include:  reports that she has never smoked. She has never used smokeless tobacco. No results for input(s): HGBA1C, LABURIC, CREATINE in the last 8760 hours. No specialty comments available. Problem  Primary Osteoarthritis of Left Knee     OBJECTIVE:  VS:  HT:5\' 2"  (157.5 cm)   WT:197 lb 9.6 oz (89.6 kg)  BMI:36.13    BP:(Abnormal) 142/100  HR:82bpm  TEMP: ( )  RESP:96 %   PHYSICAL EXAM: Constitutional: WDWN, Non-toxic appearing. Psychiatric: Alert & appropriately interactive.  Not depressed or anxious appearing. Respiratory: No increased work of breathing.  Trachea Midline Eyes: Pupils are equal.  EOM intact without nystagmus.  No scleral icterus  Vascular Exam: warm to touch no edema  lower extremity neuro exam: unremarkable  MSK Exam: Left knee with generalized swelling and edema.  She has medial joint line pain.  Pain with McMurray's and Thessaly.  Full flexion extension.  Extensor mechanism is intact.  She has lateral tilt to the patella.  X-rays reviewed.   ASSESSMENT & PLAN:   1. Acute pain of left knee   2. Primary osteoarthritis of left knee   3. Effusion of left knee     PLAN: Injection for the left knee.  Will refer to physical therapy for the hip/core conditioning.  Follow-up in 6 to 8 weeks to ensure clinical improvement/resolution.  Continue with over-the-counter brace.  Follow-up: Return in about 6 weeks (around 11/29/2017).      Please see additional documentation for Objective, Assessment and Plan sections. Pertinent additional documentation may be included in corresponding procedure notes, imaging studies, problem based documentation and patient instructions. Please see these sections of the encounter for additional information regarding this visit.  CMA/ATC served as Education administrator during this visit. History, Physical, and Plan performed by medical provider. Documentation and orders reviewed and attested  to.      Gerda Diss, [place] [facility] Physician

## 2017-10-18 NOTE — Procedures (Signed)
PROCEDURE NOTE:  Ultrasound Guided: Injection: Left knee Images were obtained and interpreted by myself, Teresa Coombs, DO  Images have been saved and stored to PACS system. Images obtained on: GE S7 Ultrasound machine    ULTRASOUND FINDINGS:  Moderate effusion.  Varus and valgus.  Degenerative medial meniscal bulge with small joint line.  DESCRIPTION OF PROCEDURE:  The patient's clinical condition is marked by substantial pain and/or significant functional disability. Other conservative therapy has not provided relief, is contraindicated, or not appropriate. There is a reasonable likelihood that injection will significantly improve the patient's pain and/or functional impairment.   After discussing the risks, benefits and expected outcomes of the injection and all questions were reviewed and answered, the patient wished to undergo the above named procedure.  Verbal consent was obtained.  The ultrasound was used to identify the target structure and adjacent neurovascular structures. The skin was then prepped in sterile fashion and the target structure was injected under direct visualization using sterile technique as below:  PREP: Alcohol and Ethel Chloride APPROACH: superiolateral, single injection, 21g 2 in. INJECTATE: 2 cc 0.5% Marcaine and 2 cc 40mg /mL DepoMedrol ASPIRATE: None DRESSING: Band-Aid  Post procedural instructions including recommending icing and warning signs for infection were reviewed.    This procedure was well tolerated and there were no complications.   IMPRESSION: Succesful Ultrasound Guided: Injection

## 2017-10-18 NOTE — Patient Instructions (Signed)

## 2017-10-19 ENCOUNTER — Other Ambulatory Visit: Payer: Self-pay | Admitting: Family Medicine

## 2017-10-21 ENCOUNTER — Ambulatory Visit (INDEPENDENT_AMBULATORY_CARE_PROVIDER_SITE_OTHER): Payer: Medicare Other | Admitting: Physical Therapy

## 2017-10-21 DIAGNOSIS — M25562 Pain in left knee: Secondary | ICD-10-CM | POA: Diagnosis not present

## 2017-10-21 NOTE — Patient Instructions (Signed)
Access Code:  [email or web address]  URL: [email or web address]  Date: 10/21/2017  Prepared by: Lyndee Hensen   Exercises  Supine Heel Slides - 10 reps - 2 sets - 2x daily  Long Sitting Quad Set - 10 reps - 2 sets - 3 hold - 2x daily  Supine Short Arc Quad - 10 reps - 2 sets - 2x daily  Seated Long Arc Quad - 10 reps - 2 sets - 2x daily  Straight Leg Raise - 10 reps - 2 sets - 2x daily  Seated Hamstring Stretch - 3 reps - 30 hold - 2x daily

## 2017-10-22 ENCOUNTER — Encounter: Payer: Self-pay | Admitting: Physical Therapy

## 2017-10-22 NOTE — Therapy (Signed)
[name] [address] [place], [place], [address] Phone: [phone]   Fax:  [phone]  Physical Therapy Evaluation  Patient Details  Name: Lynn [name] MRN: [id number] Date of Birth: 04/23/1951 Referring Provider: Teresa Coombs   Encounter Date: 10/21/2017  PT End of Session - 10/22/17 2131    Visit Number  1    Number of Visits  12    Date for PT Re-Evaluation  12/02/17    PT Start Time  9528    PT Stop Time  1557    PT Time Calculation (min)  42 min    Activity Tolerance  Patient tolerated treatment well    Behavior During Therapy  [facility] for tasks assessed/performed       Past Medical History:  Diagnosis Date  . Hyperlipidemia   . Hypothyroidism     Past Surgical History:  Procedure Laterality Date  . KNEE ARTHROSCOPY    . PARTIAL HYSTERECTOMY    . TONSILLECTOMY    . TUBAL LIGATION      There were no vitals filed for this visit.   Subjective Assessment - 10/21/17 1518    Subjective  Pt states increased pain in L knee, when she was hurrying down stairs about 1 month ago. She had injection this week, states some improvements. She states mild swelling at end of the day. She is retired, but is active around the house. She has not had pain in L knee previously.     Limitations  House hold activities    Currently in Pain?  Yes    Pain Score  6     Pain Location  Knee    Pain Orientation  Left    Pain Descriptors / Indicators  Aching    Pain Type  Acute pain    Pain Onset  1 to 4 weeks ago    Pain Frequency  Intermittent    Aggravating Factors   Stairs          [facility] PT Assessment - 10/22/17 0001      Assessment   Medical Diagnosis  L knee pain    Referring Provider  Teresa Coombs    Prior Therapy  no      Precautions   Precautions  None      Restrictions   Weight Bearing Restrictions  No      Balance Screen   Has the patient fallen in the past 6 months  No      Prior Function   Level of  Independence  Independent      Cognition   Overall Cognitive Status  Within Functional Limits for tasks assessed      Posture/Postural Control   Posture Comments  L LE longer?       AROM   Right Knee Extension  0    Right Knee Flexion  120    Left Knee Extension  0    Left Knee Flexion  134      Strength   Right Knee Flexion  5/5    Right Knee Extension  5/5    Left Knee Flexion  4/5    Left Knee Extension  4/5      Palpation   Palpation comment  Pain at Medial knee on L; at [employer] and medial joint line. No pain laterally or posteriorly; Mild limitation for hamstring mobility;       Special Tests   Other special tests  Negative Meniscal testing; negative MCL testing  today (pt with minimal pain due to recent injection)       Ambulation/Gait   Gait Comments  unremarkable                 Objective measurements completed on examination: See above findings.      [facility] Adult PT Treatment/Exercise - 10/22/17 0001      Exercises   Exercises  Knee/Hip      Knee/Hip Exercises: Stretches   Active Hamstring Stretch  3 reps;30 seconds      Knee/Hip Exercises: Seated   Long Arc Quad  20 reps      Knee/Hip Exercises: Supine   Short Arc Quad Sets  20 reps    Heel Slides  10 reps    Straight Leg Raises  20 reps             PT Education - 10/22/17 2130    Education provided  Yes    Education Details  HEP , PT POC     Person(s) Educated  Patient    Methods  Explanation;Demonstration;Verbal cues;Handout    Comprehension  Verbalized understanding;Need further instruction       PT Short Term Goals - 10/22/17 2144      PT SHORT TERM GOAL #1   Title  Pt to be independent with initial HEP     Time  2    Period  Weeks    Status  New    Target Date  11/04/17        PT Long Term Goals - 10/22/17 2144      PT LONG TERM GOAL #1   Title  Pt to demo improved L knee strength to at least 4+/5 to improve stability and pain.     Time  6    Period  Weeks     Status  New    Target Date  12/02/17      PT LONG TERM GOAL #2   Title  Pt to report decreased pain in L knee, to 0-2/10 with standing activity     Time  6    Period  Weeks    Status  New    Target Date  12/02/17      PT LONG TERM GOAL #3   Title  Pt to demo ability to navigate 5 steps with out pain greater than 2/10, to improve ability for home and community navigation     Time  6    Period  Weeks    Status  New    Target Date  12/02/17             Plan - 10/22/17 2134    Clinical Impression Statement  Pt presents with primary complaint of pain in L knee. She has ROM WNL in L knee, and has mild strength deficits. She has increased pain at medial knee, increased with increased activity and standing activity. Pain has been somewhat improved since recent injection, but is still limiting ability for full functional activity. Pt to benefit from skilled PT to improve deficits and return to PLOF without pain.     Clinical Presentation  Stable    Clinical Decision Making  Low    Rehab Potential  Good    PT Frequency  2x / week    PT Duration  6 weeks    PT Treatment/Interventions  ADLs/Self Care Home Management;Cryotherapy;Electrical Stimulation;Iontophoresis 4mg /ml Dexamethasone;Moist Heat;Therapeutic activities;Functional mobility training;Stair training;Gait training;Ultrasound;Therapeutic exercise;Balance training;Neuromuscular re-education;Patient/family education;Orthotic Fit/Training;Dry needling;Passive range of motion;Manual techniques;Taping;Vasopneumatic  Device    Consulted and Agree with Plan of Care  Patient       Patient will benefit from skilled therapeutic intervention in order to improve the following deficits and impairments:  Abnormal gait, Decreased endurance, Decreased activity tolerance, Decreased strength, Pain, Increased muscle spasms, Decreased mobility, Decreased balance, Difficulty walking  Visit Diagnosis: Acute pain of left knee     Problem  List Patient Active Problem List   Diagnosis Date Noted  . Primary osteoarthritis of left knee 10/18/2017  . Elevated BP without diagnosis of hypertension 11/05/2016  . Physical exam 04/22/2016  . Hypothyroidism 10/24/2015  . Hyperlipidemia 10/24/2015  . Obesity (BMI 35.0-39.9 without comorbidity) 10/24/2015    Lyndee Hensen, PT, DPT 9:57 PM  10/22/17    [place], [place], [address] Phone: [phone]   Fax:  [phone]  Name: RAAHI KORBER MRN: [id number] Date of Birth: [birth year]

## 2017-10-27 ENCOUNTER — Ambulatory Visit (INDEPENDENT_AMBULATORY_CARE_PROVIDER_SITE_OTHER): Payer: Medicare Other | Admitting: Physical Therapy

## 2017-10-27 DIAGNOSIS — M25562 Pain in left knee: Secondary | ICD-10-CM | POA: Diagnosis not present

## 2017-10-27 NOTE — Therapy (Signed)
[facility] [address] [place], [place], [address] Phone: [phone]   Fax:  [phone]  Physical Therapy Treatment  Patient Details  Name: Lynn [name] MRN: [id number] Date of Birth: 1950/07/24 Referring Provider: Teresa Coombs   Encounter Date: 10/27/2017  PT End of Session - 10/27/17 1019    Visit Number  2    Number of Visits  12    Date for PT Re-Evaluation  12/02/17    PT Start Time  1019    PT Stop Time  1112    PT Time Calculation (min)  53 min    Activity Tolerance  Patient tolerated treatment well    Behavior During Therapy  [facility] for tasks assessed/performed       Past Medical History:  Diagnosis Date  . Hyperlipidemia   . Hypothyroidism     Past Surgical History:  Procedure Laterality Date  . KNEE ARTHROSCOPY    . PARTIAL HYSTERECTOMY    . TONSILLECTOMY    . TUBAL LIGATION      There were no vitals filed for this visit.  Subjective Assessment - 10/27/17 1019    Subjective  Patient reports soreness in knee today. Pain with walking and is more in the left medial tibia.    Currently in Pain?  Yes    Pain Score  5     Pain Location  Knee    Pain Orientation  Left    Pain Descriptors / Indicators  Aching                       [facility] Adult PT Treatment/Exercise - 10/27/17 0001      Self-Care   Self-Care  Other Self-Care Comments ionto education and use of roller for STM      Knee/Hip Exercises: Stretches   Active Hamstring Stretch  1 rep;60 seconds;Both with strap    Soleus Stretch  Left;2 reps;30 seconds    Other Knee/Hip Stretches  lateral hip stretch L with strap x 30 sec      Knee/Hip Exercises: Aerobic   Stationary Bike  L2 x 5 min      Knee/Hip Exercises: Seated   Long Arc Quad  Strengthening;20 reps;Weights 5 sec hold    Long Arc Quad Weight  5 lbs.    Hamstring Curl  Strengthening;Left;20 reps RTB    Sit to Sand  20 reps;without UE support      Knee/Hip Exercises: Supine    Short Arc Quad Sets  Strengthening;Left;20 reps 5# wt    Straight Leg Raises  Left;2 sets;10 reps    Straight Leg Raise with External Rotation  Left;2 sets;10 reps      Modalities   Modalities  Iontophoresis      Iontophoresis   Type of Iontophoresis  Dexamethasone    Location  left pes anserine    Dose  4 mg    Time  4 hour patch      Manual Therapy   Manual Therapy  Soft tissue mobilization    Soft tissue mobilization  to left hip ADDuctors and medial HS             PT Education - 10/27/17 1121    Education provided  Yes    Education Details  see self care    Person(s) Educated  Patient    Methods  Explanation;Demonstration;Handout    Comprehension  Verbalized understanding;Returned demonstration       PT Short Term Goals -  10/22/17 2144      PT SHORT TERM GOAL #1   Title  Pt to be independent with initial HEP     Time  2    Period  Weeks    Status  New    Target Date  11/04/17        PT Long Term Goals - 10/22/17 2144      PT LONG TERM GOAL #1   Title  Pt to demo improved L knee strength to at least 4+/5 to improve stability and pain.     Time  6    Period  Weeks    Status  New    Target Date  12/02/17      PT LONG TERM GOAL #2   Title  Pt to report decreased pain in L knee, to 0-2/10 with standing activity     Time  6    Period  Weeks    Status  New    Target Date  12/02/17      PT LONG TERM GOAL #3   Title  Pt to demo ability to navigate 5 steps with out pain greater than 2/10, to improve ability for home and community navigation     Time  6    Period  Weeks    Status  New    Target Date  12/02/17            Plan - 10/27/17 1121    Clinical Impression Statement  Patient's main complaint today was in area of pes anserine. She was very tender in L hip ADD and medial HS and responded well to STW. She did well with TE with no c/o pain. Ionto patch #1 applied to L pes anserine.    PT Treatment/Interventions  ADLs/Self Care Home  Management;Cryotherapy;Electrical Stimulation;Iontophoresis 4mg /ml Dexamethasone;Moist Heat;Therapeutic activities;Functional mobility training;Stair training;Gait training;Ultrasound;Therapeutic exercise;Balance training;Neuromuscular re-education;Patient/family education;Orthotic Fit/Training;Dry needling;Passive range of motion;Manual techniques;Taping;Vasopneumatic Device    PT Next Visit Plan  assess ionto and continue as indicated.        Patient will benefit from skilled therapeutic intervention in order to improve the following deficits and impairments:  Abnormal gait, Decreased endurance, Decreased activity tolerance, Decreased strength, Pain, Increased muscle spasms, Decreased mobility, Decreased balance, Difficulty walking  Visit Diagnosis: Acute pain of left knee     Problem List Patient Active Problem List   Diagnosis Date Noted  . Primary osteoarthritis of left knee 10/18/2017  . Elevated BP without diagnosis of hypertension 11/05/2016  . Physical exam 04/22/2016  . Hypothyroidism 10/24/2015  . Hyperlipidemia 10/24/2015  . Obesity (BMI 35.0-39.9 without comorbidity) 10/24/2015   Lynn [name] PT 10/27/2017, 11:25 AM  [facility] [place], [place], [address] Phone: [phone]   Fax:  [phone]  Name: Lynn [name] MRN: [id number] Date of Birth: November 25, 1950

## 2017-10-27 NOTE — Patient Instructions (Signed)

## 2017-11-02 ENCOUNTER — Telehealth: Payer: Self-pay | Admitting: Family Medicine

## 2017-11-02 NOTE — Telephone Encounter (Signed)
Copied from [facility] [id number]. Topic: Quick Communication - Rx Refill/Question >> Nov 02, 2017  5:38 PM Neva Seat wrote: atorvastatin (LIPITOR) 40 MG tablet  Needing refills  [id number] - [place], [place] - [address] [place] [address] Phone: [phone] Fax: [phone]

## 2017-11-03 ENCOUNTER — Encounter: Payer: Self-pay | Admitting: Physical Therapy

## 2017-11-03 ENCOUNTER — Ambulatory Visit (INDEPENDENT_AMBULATORY_CARE_PROVIDER_SITE_OTHER): Payer: Medicare Other | Admitting: Physical Therapy

## 2017-11-03 DIAGNOSIS — M25562 Pain in left knee: Secondary | ICD-10-CM | POA: Diagnosis not present

## 2017-11-03 MED ORDER — ATORVASTATIN CALCIUM 40 MG PO TABS: ORAL_TABLET | ORAL | 1 refills | Status: DC

## 2017-11-03 NOTE — Therapy (Signed)
[place] [address] [place], [place], [address] Phone: [phone]   Fax:  [phone]  Physical Therapy Treatment  Patient Details  Name: Lynn [name] MRN: [id number] Date of Birth: 1950/08/04 Referring Provider: Teresa Coombs   Encounter Date: 11/03/2017  PT End of Session - 11/03/17 1140    Visit Number  3    Number of Visits  12    Date for PT Re-Evaluation  12/02/17    PT Start Time  0093    PT Stop Time  1217    PT Time Calculation (min)  42 min    Activity Tolerance  Patient tolerated treatment well    Behavior During Therapy  [facility] for tasks assessed/performed       Past Medical History:  Diagnosis Date  . Hyperlipidemia   . Hypothyroidism     Past Surgical History:  Procedure Laterality Date  . KNEE ARTHROSCOPY    . PARTIAL HYSTERECTOMY    . TONSILLECTOMY    . TUBAL LIGATION      There were no vitals filed for this visit.  Subjective Assessment - 11/03/17 1138    Subjective  Pt states that she has been swimming alot. She is still having knee pain, but states swelling is a bit better.     Pain Score  5     Pain Location  Knee    Pain Orientation  Left    Pain Descriptors / Indicators  Aching    Pain Type  Acute pain    Pain Onset  1 to 4 weeks ago    Pain Frequency  Intermittent                       [facility] Adult PT Treatment/Exercise - 11/03/17 1141      Self-Care   Self-Care  -- ionto education and use of roller for [employer]      Knee/Hip Exercises: Stretches   Active Hamstring Stretch  3 reps;30 seconds;Left with strap    Soleus Stretch  --    Other Knee/Hip Stretches  --      Knee/Hip Exercises: Aerobic   Stationary Bike  L2 x 7 min      Knee/Hip Exercises: Standing   Knee Flexion  2 sets;10 reps;Both    Hip Abduction  2 sets;10 reps;Both      Knee/Hip Exercises: Seated   Long Arc Quad  Strengthening;20 reps;Weights 5 sec hold    Long Arc Quad Weight  3 lbs.    Hamstring Curl   Strengthening;Left;20 reps RTB    Sit to [employer]  --      Knee/Hip Exercises: Supine   Quad Sets  20 reps    Short Arc Quad Sets  -- 5# wt    Straight Leg Raises  Left;2 sets;10 reps;Both    Straight Leg Raise with External Rotation  --      Modalities   Modalities  Iontophoresis      Iontophoresis   Type of Iontophoresis  Dexamethasone    Location  left pes anserine    Dose  4 mg    Time  4 hour patch      Manual Therapy   Manual Therapy  Soft tissue mobilization    Soft tissue mobilization  DTM to L medial HS insertion, L medial gastroc origin, and pesanserine               PT Short Term Goals - 11/03/17 1141  PT SHORT TERM GOAL #1   Title  Pt to be independent with initial HEP     Time  2    Period  Weeks    Status  Achieved        PT Long Term Goals - 10/22/17 2144      PT LONG TERM GOAL #1   Title  Pt to demo improved L knee strength to at least 4+/5 to improve stability and pain.     Time  6    Period  Weeks    Status  New    Target Date  12/02/17      PT LONG TERM GOAL #2   Title  Pt to report decreased pain in L knee, to 0-2/10 with standing activity     Time  6    Period  Weeks    Status  New    Target Date  12/02/17      PT LONG TERM GOAL #3   Title  Pt to demo ability to navigate 5 steps with out pain greater than 2/10, to improve ability for home and community navigation     Time  6    Period  Weeks    Status  New    Target Date  12/02/17            Plan - 11/03/17 1348    Clinical Impression Statement  Pt with significant sorness with DTM at medial gastroc today, decreased after DTM. Ionto continued at medial knee, due to Positive response last visit. Plan to progress strength and stabilization as tolerated.     PT Treatment/Interventions  ADLs/Self Care Home Management;Cryotherapy;Electrical Stimulation;Iontophoresis 4mg /ml Dexamethasone;Moist Heat;Therapeutic activities;Functional mobility training;Stair training;Gait  training;Ultrasound;Therapeutic exercise;Balance training;Neuromuscular re-education;Patient/family education;Orthotic Fit/Training;Dry needling;Passive range of motion;Manual techniques;Taping;Vasopneumatic Device    PT Next Visit Plan  assess ionto and continue as indicated.        Patient will benefit from skilled therapeutic intervention in order to improve the following deficits and impairments:  Abnormal gait, Decreased endurance, Decreased activity tolerance, Decreased strength, Pain, Increased muscle spasms, Decreased mobility, Decreased balance, Difficulty walking  Visit Diagnosis: Acute pain of left knee     Problem List Patient Active Problem List   Diagnosis Date Noted  . Primary osteoarthritis of left knee 10/18/2017  . Elevated BP without diagnosis of hypertension 11/05/2016  . Physical exam 04/22/2016  . Hypothyroidism 10/24/2015  . Hyperlipidemia 10/24/2015  . Obesity (BMI 35.0-39.9 without comorbidity) 10/24/2015    Lyndee Hensen, PT, DPT 1:50 PM  11/03/17    [facility] [place], [place], [address] Phone: [phone]   Fax:  [phone]  Name: Lynn [name] MRN: [id number] Date of Birth: [birth year]

## 2017-11-08 ENCOUNTER — Encounter: Payer: Self-pay | Admitting: General Practice

## 2017-11-08 ENCOUNTER — Encounter: Payer: Self-pay | Admitting: Physical Therapy

## 2017-11-08 ENCOUNTER — Other Ambulatory Visit (INDEPENDENT_AMBULATORY_CARE_PROVIDER_SITE_OTHER): Payer: Medicare Other

## 2017-11-08 ENCOUNTER — Other Ambulatory Visit: Payer: Self-pay | Admitting: Family Medicine

## 2017-11-08 ENCOUNTER — Ambulatory Visit (INDEPENDENT_AMBULATORY_CARE_PROVIDER_SITE_OTHER): Payer: Medicare Other | Admitting: Physical Therapy

## 2017-11-08 DIAGNOSIS — E038 Other specified hypothyroidism: Secondary | ICD-10-CM

## 2017-11-08 DIAGNOSIS — M25562 Pain in left knee: Secondary | ICD-10-CM | POA: Diagnosis not present

## 2017-11-08 DIAGNOSIS — R7989 Other specified abnormal findings of blood chemistry: Secondary | ICD-10-CM

## 2017-11-08 LAB — TSH: TSH: 0.34 u[IU]/mL — ABNORMAL LOW (ref 0.35–4.50)

## 2017-11-08 NOTE — Therapy (Signed)
[facility] [address] [place], [place], [address] Phone: [phone]   Fax:  [phone]  Physical Therapy Treatment  Patient Details  Name: Lynn [name] MRN: [id number] Date of Birth: [birth year] Referring Provider: Teresa Coombs   Encounter Date: 11/08/2017  PT End of Session - 11/08/17 1207    Visit Number  4    Number of Visits  12    Date for PT Re-Evaluation  12/02/17    PT Start Time  1046    PT Stop Time  1135    PT Time Calculation (min)  49 min    Activity Tolerance  Patient tolerated treatment well    Behavior During Therapy  [facility] for tasks assessed/performed       Past Medical History:  Diagnosis Date  . Hyperlipidemia   . Hypothyroidism     Past Surgical History:  Procedure Laterality Date  . KNEE ARTHROSCOPY    . PARTIAL HYSTERECTOMY    . TONSILLECTOMY    . TUBAL LIGATION      There were no vitals filed for this visit.  Subjective Assessment - 11/08/17 1057    Subjective  Pt states improving knee pain. She does state achiness in B LEs at end of the day.     Currently in Pain?  Yes    Pain Score  3     Pain Location  Knee    Pain Orientation  Left    Pain Descriptors / Indicators  Aching    Pain Type  Acute pain    Pain Onset  1 to 4 weeks ago    Pain Frequency  Intermittent                       [facility] Adult PT Treatment/Exercise - 11/08/17 1051      Self-Care   Self-Care  --      Knee/Hip Exercises: Stretches   Active Hamstring Stretch  3 reps;30 seconds;Left with strap      Knee/Hip Exercises: Aerobic   Stationary Bike  L2 x 7 min      Knee/Hip Exercises: Standing   Knee Flexion  2 sets;10 reps;Both    Hip Abduction  2 sets;10 reps;Both    Forward Step Up  10 reps;Both    Other Standing Knee Exercises  Tandem Stance 30 sec x2 bil      Knee/Hip Exercises: Seated   Long Arc Quad  Strengthening;20 reps;Weights 5 sec hold    Long Arc Quad Weight  3 lbs.    Hamstring Curl   Strengthening;Left;20 reps RTB      Knee/Hip Exercises: Supine   Quad Sets  --    Short Arc Quad Sets  --    Straight Leg Raises  Left;2 sets;10 reps;Both      Modalities   Modalities  Iontophoresis      Iontophoresis   Type of Iontophoresis  Dexamethasone    Location  left pes anserine    Dose  4 mg    Time  4 hour patch      Manual Therapy   Manual Therapy  Soft tissue mobilization    Soft tissue mobilization  DTM to L medial HS insertion, L medial gastroc origin, and pesanserine               PT Short Term Goals - 11/03/17 1141      PT SHORT TERM GOAL #1   Title  Pt to be independent  with initial HEP     Time  2    Period  Weeks    Status  Achieved        PT Long Term Goals - 10/22/17 2144      PT LONG TERM GOAL #1   Title  Pt to demo improved L knee strength to at least 4+/5 to improve stability and pain.     Time  6    Period  Weeks    Status  New    Target Date  12/02/17      PT LONG TERM GOAL #2   Title  Pt to report decreased pain in L knee, to 0-2/10 with standing activity     Time  6    Period  Weeks    Status  New    Target Date  12/02/17      PT LONG TERM GOAL #3   Title  Pt to demo ability to navigate 5 steps with out pain greater than 2/10, to improve ability for home and community navigation     Time  6    Period  Weeks    Status  New    Target Date  12/02/17            Plan - 11/08/17 1209    Clinical Impression Statement  Pt with tenderness at posterior/medial and anterior/medial knee with DTM today, but decreased from last week. She has no increased pain with ther ex/strengthening today. Plan to progress as tolerated. Recommend continue care.     PT Treatment/Interventions  ADLs/Self Care Home Management;Cryotherapy;Electrical Stimulation;Iontophoresis 4mg /ml Dexamethasone;Moist Heat;Therapeutic activities;Functional mobility training;Stair training;Gait training;Ultrasound;Therapeutic exercise;Balance training;Neuromuscular  re-education;Patient/family education;Orthotic Fit/Training;Dry needling;Passive range of motion;Manual techniques;Taping;Vasopneumatic Device    PT Next Visit Plan  assess ionto and continue as indicated.        Patient will benefit from skilled therapeutic intervention in order to improve the following deficits and impairments:  Abnormal gait, Decreased endurance, Decreased activity tolerance, Decreased strength, Pain, Increased muscle spasms, Decreased mobility, Decreased balance, Difficulty walking  Visit Diagnosis: Acute pain of left knee     Problem List Patient Active Problem List   Diagnosis Date Noted  . Primary osteoarthritis of left knee 10/18/2017  . Elevated BP without diagnosis of hypertension 11/05/2016  . Physical exam 04/22/2016  . Hypothyroidism 10/24/2015  . Hyperlipidemia 10/24/2015  . Obesity (BMI 35.0-39.9 without comorbidity) 10/24/2015    Lyndee Hensen, PT, DPT 12:10 PM  11/08/17    [facility] [place], [place], [address] Phone: [phone]   Fax:  [phone]  Name: Lynn [name] MRN: [id number] Date of Birth: 07-08-1950

## 2017-11-09 ENCOUNTER — Other Ambulatory Visit: Payer: Self-pay | Admitting: Family Medicine

## 2017-11-10 ENCOUNTER — Ambulatory Visit (INDEPENDENT_AMBULATORY_CARE_PROVIDER_SITE_OTHER): Payer: Medicare Other | Admitting: Physical Therapy

## 2017-11-10 DIAGNOSIS — M25562 Pain in left knee: Secondary | ICD-10-CM

## 2017-11-10 NOTE — Therapy (Signed)
[facility] [address] [place], [place], [address] Phone: [phone]   Fax:  [phone]  Physical Therapy Treatment  Patient Details  Name: Lynn [name] MRN: [id number] Date of Birth: [birth year] Referring Provider: Teresa Coombs   Encounter Date: 11/10/2017  PT End of Session - 11/10/17 1438    Visit Number  5    Number of Visits  12    Date for PT Re-Evaluation  12/02/17    PT Start Time  1105    PT Stop Time  1148    PT Time Calculation (min)  43 min    Activity Tolerance  Patient tolerated treatment well    Behavior During Therapy  [facility] for tasks assessed/performed       Past Medical History:  Diagnosis Date  . Hyperlipidemia   . Hypothyroidism     Past Surgical History:  Procedure Laterality Date  . KNEE ARTHROSCOPY    . PARTIAL HYSTERECTOMY    . TONSILLECTOMY    . TUBAL LIGATION      There were no vitals filed for this visit.  Subjective Assessment - 11/10/17 1113    Subjective  No new complaints today     Currently in Pain?  Yes    Pain Score  4     Pain Location  Knee    Pain Orientation  Left    Pain Descriptors / Indicators  Aching    Pain Type  Acute pain    Pain Onset  More than a month ago    Pain Frequency  Intermittent                       [facility] Adult PT Treatment/Exercise - 11/10/17 0001      Knee/Hip Exercises: Stretches   Active Hamstring Stretch  3 reps;30 seconds;Left with strap    Passive Hamstring Stretch  Limitations    Passive Hamstring Stretch Limitations  Supine with strap, and ankle pump 2 x 20;     Gastroc Stretch  3 reps;30 seconds;Limitations    Gastroc Stretch Limitations  at wall and off step      Knee/Hip Exercises: Aerobic   Stationary Bike  L2 x 8 min      Knee/Hip Exercises: Standing   Heel Raises  2 sets;10 reps    Knee Flexion  2 sets;10 reps;Both    Hip Abduction  10 reps;Both;3 sets    Lateral Step Up  10 reps;Both    Forward Step Up  10  reps;Both    Other Standing Knee Exercises  Tandem Stance 30 sec x2 bil      Knee/Hip Exercises: Seated   Long Arc Quad  Strengthening;20 reps;Weights 5 sec hold    Long Arc Quad Weight  3 lbs.    Hamstring Curl  Strengthening;Left;20 reps RTB      Knee/Hip Exercises: Supine   Short Arc Quad Sets  Strengthening;Left;20 reps;Limitations    Short Arc Quad Sets Limitations  3lb    Straight Leg Raises  Left;2 sets;10 reps;Both      Modalities   Modalities  --      Iontophoresis   Type of Iontophoresis  Dexamethasone    Location  left pes anserine    Dose  4 mg    Time  4 hour patch      Manual Therapy   Manual Therapy  Soft tissue mobilization    Soft tissue mobilization  DTM to L medial HS insertion,  L medial gastroc origin, and pesanserine               PT Short Term Goals - 11/03/17 1141      PT SHORT TERM GOAL #1   Title  Pt to be independent with initial HEP     Time  2    Period  Weeks    Status  Achieved        PT Long Term Goals - 10/22/17 2144      PT LONG TERM GOAL #1   Title  Pt to demo improved L knee strength to at least 4+/5 to improve stability and pain.     Time  6    Period  Weeks    Status  New    Target Date  12/02/17      PT LONG TERM GOAL #2   Title  Pt to report decreased pain in L knee, to 0-2/10 with standing activity     Time  6    Period  Weeks    Status  New    Target Date  12/02/17      PT LONG TERM GOAL #3   Title  Pt to demo ability to navigate 5 steps with out pain greater than 2/10, to improve ability for home and community navigation     Time  6    Period  Weeks    Status  New    Target Date  12/02/17            Plan - 11/10/17 1440    Clinical Impression Statement  Pt with pain and tenderness at medial knee, mcl region, mildly decreased from last session. Pt improving with ther ex and ability for strengthening. No increased pain with exercises. Plan to continue pain relief and strengthening.     PT  Treatment/Interventions  ADLs/Self Care Home Management;Cryotherapy;Electrical Stimulation;Iontophoresis 4mg /ml Dexamethasone;Moist Heat;Therapeutic activities;Functional mobility training;Stair training;Gait training;Ultrasound;Therapeutic exercise;Balance training;Neuromuscular re-education;Patient/family education;Orthotic Fit/Training;Dry needling;Passive range of motion;Manual techniques;Taping;Vasopneumatic Device    PT Next Visit Plan  assess ionto and continue as indicated.        Patient will benefit from skilled therapeutic intervention in order to improve the following deficits and impairments:  Abnormal gait, Decreased endurance, Decreased activity tolerance, Decreased strength, Pain, Increased muscle spasms, Decreased mobility, Decreased balance, Difficulty walking  Visit Diagnosis: Acute pain of left knee     Problem List Patient Active Problem List   Diagnosis Date Noted  . Primary osteoarthritis of left knee 10/18/2017  . Elevated BP without diagnosis of hypertension 11/05/2016  . Physical exam 04/22/2016  . Hypothyroidism 10/24/2015  . Hyperlipidemia 10/24/2015  . Obesity (BMI 35.0-39.9 without comorbidity) 10/24/2015    Lyndee Hensen, PT, DPT 2:42 PM  11/10/17    [place], [place], [address] Phone: [phone]   Fax:  [phone]  Name: Lynn [name] MRN: [id number] Date of Birth: [birth year]

## 2017-11-12 ENCOUNTER — Encounter: Payer: Self-pay | Admitting: General Practice

## 2017-11-12 ENCOUNTER — Ambulatory Visit
Admission: RE | Admit: 2017-11-12 | Discharge: 2017-11-12 | Disposition: A | Discharge status: Home / Self Care | Payer: Medicare Other | Source: Ambulatory Visit | Attending: Family Medicine | Admitting: Family Medicine

## 2017-11-12 DIAGNOSIS — M85851 Other specified disorders of bone density and structure, right thigh: Secondary | ICD-10-CM | POA: Diagnosis not present

## 2017-11-12 DIAGNOSIS — Z1231 Encounter for screening mammogram for malignant neoplasm of breast: Secondary | ICD-10-CM

## 2017-11-12 DIAGNOSIS — Z780 Asymptomatic menopausal state: Secondary | ICD-10-CM | POA: Diagnosis not present

## 2017-11-12 DIAGNOSIS — E2839 Other primary ovarian failure: Secondary | ICD-10-CM

## 2017-11-15 ENCOUNTER — Ambulatory Visit (INDEPENDENT_AMBULATORY_CARE_PROVIDER_SITE_OTHER): Payer: Medicare Other | Admitting: Physical Therapy

## 2017-11-15 DIAGNOSIS — M25562 Pain in left knee: Secondary | ICD-10-CM | POA: Diagnosis not present

## 2017-11-15 NOTE — Therapy (Signed)
[facility] [address] [place], [place], [address] Phone: [phone]   Fax:  [phone]  Physical Therapy Treatment  Patient Details  Name: Lynn [name] MRN: [id number] Date of Birth: [birth year] Referring Provider: Teresa Coombs   Encounter Date: 11/15/2017  PT End of Session - 11/15/17 1016    Visit Number  6    Number of Visits  12    Date for PT Re-Evaluation  12/02/17    PT Start Time  1011    PT Stop Time  1057    PT Time Calculation (min)  46 min    Activity Tolerance  Patient tolerated treatment well    Behavior During Therapy  [facility] for tasks assessed/performed       Past Medical History:  Diagnosis Date  . Hyperlipidemia   . Hypothyroidism     Past Surgical History:  Procedure Laterality Date  . KNEE ARTHROSCOPY    . PARTIAL HYSTERECTOMY    . TONSILLECTOMY    . TUBAL LIGATION      There were no vitals filed for this visit.  Subjective Assessment - 11/15/17 1015    Subjective  Pt states continued pain in knee, increased pain on Friday when sitting at Dr. appt for a long time.     Limitations  Sitting;Lifting;Standing;Walking;House hold activities    Currently in Pain?  Yes    Pain Score  4     Pain Orientation  Left    Pain Descriptors / Indicators  Aching    Pain Type  Acute pain    Pain Onset  More than a month ago    Pain Frequency  Intermittent                       [facility] Adult PT Treatment/Exercise - 11/15/17 1017      Knee/Hip Exercises: Stretches   Active Hamstring Stretch  3 reps;30 seconds;Left with strap    Passive Hamstring Stretch  Limitations    Passive Hamstring Stretch Limitations  Supine with strap, and ankle pump 2 x 20;     Gastroc Stretch  3 reps;30 seconds;Limitations    Gastroc Stretch Limitations  off step      Knee/Hip Exercises: Aerobic   Stationary Bike  L2 x 8 min      Knee/Hip Exercises: Standing   Heel Raises  2 sets;10 reps    Knee Flexion  2 sets;10  reps;Both    Hip Abduction  10 reps;Both;3 sets    Lateral Step Up  --    Forward Step Up  --    Other Standing Knee Exercises  Tandem Stance 30 sec x2 bil, with UE flex 2x10 bil;     Other Standing Knee Exercises  March/Walk 15 ft x4;       Knee/Hip Exercises: Seated   Long Arc Quad  Strengthening;20 reps;Weights 5 sec hold    Long Arc Quad Weight  3 lbs.    Hamstring Curl  Strengthening;Left;20 reps    Hamstring Limitations  GTB      Knee/Hip Exercises: Supine   Short Arc Quad Sets  --    Straight Leg Raises  Left;2 sets;10 reps;Both      Knee/Hip Exercises: Sidelying   Hip ABduction  2 sets;10 reps      Iontophoresis   Type of Iontophoresis  Dexamethasone    Location  left pes anserine    Dose  4 mg    Time  4  hour patch      Manual Therapy   Manual Therapy  Passive ROM;Joint mobilization    Joint Mobilization  To increase L knee flex and ext    Soft tissue mobilization  --    Passive ROM  For L flex/ext               PT Short Term Goals - 11/03/17 1141      PT SHORT TERM GOAL #1   Title  Pt to be independent with initial HEP     Time  2    Period  Weeks    Status  Achieved        PT Long Term Goals - 10/22/17 2144      PT LONG TERM GOAL #1   Title  Pt to demo improved L knee strength to at least 4+/5 to improve stability and pain.     Time  6    Period  Weeks    Status  New    Target Date  12/02/17      PT LONG TERM GOAL #2   Title  Pt to report decreased pain in L knee, to 0-2/10 with standing activity     Time  6    Period  Weeks    Status  New    Target Date  12/02/17      PT LONG TERM GOAL #3   Title  Pt to demo ability to navigate 5 steps with out pain greater than 2/10, to improve ability for home and community navigation     Time  6    Period  Weeks    Status  New    Target Date  12/02/17            Plan - 11/15/17 1059    Clinical Impression Statement  Pt with much improved tenderness at medial knee today. Pt with no  increased pain with end range flexion. She has improving ability for and strength with ther ex. Plan to progress strengthening and stabilization, and work towards d/c as pain declines.     PT Treatment/Interventions  ADLs/Self Care Home Management;Cryotherapy;Electrical Stimulation;Iontophoresis 4mg /ml Dexamethasone;Moist Heat;Therapeutic activities;Functional mobility training;Stair training;Gait training;Ultrasound;Therapeutic exercise;Balance training;Neuromuscular re-education;Patient/family education;Orthotic Fit/Training;Dry needling;Passive range of motion;Manual techniques;Taping;Vasopneumatic Device    PT Next Visit Plan  assess ionto and continue as indicated.        Patient will benefit from skilled therapeutic intervention in order to improve the following deficits and impairments:  Abnormal gait, Decreased endurance, Decreased activity tolerance, Decreased strength, Pain, Increased muscle spasms, Decreased mobility, Decreased balance, Difficulty walking  Visit Diagnosis: Acute pain of left knee     Problem List Patient Active Problem List   Diagnosis Date Noted  . Primary osteoarthritis of left knee 10/18/2017  . Elevated BP without diagnosis of hypertension 11/05/2016  . Physical exam 04/22/2016  . Hypothyroidism 10/24/2015  . Hyperlipidemia 10/24/2015  . Obesity (BMI 35.0-39.9 without comorbidity) 10/24/2015    Lyndee Hensen, PT, DPT 11:02 AM  11/15/17    [facility], [place], [address] Phone: [phone]   Fax:  [phone]  Name: Lynn [name] MRN: [id number] Date of Birth: 01/19/1951

## 2017-11-17 ENCOUNTER — Encounter: Payer: Self-pay | Admitting: Physical Therapy

## 2017-11-17 ENCOUNTER — Ambulatory Visit (INDEPENDENT_AMBULATORY_CARE_PROVIDER_SITE_OTHER): Payer: Medicare Other | Admitting: Physical Therapy

## 2017-11-17 DIAGNOSIS — M25562 Pain in left knee: Secondary | ICD-10-CM | POA: Diagnosis not present

## 2017-11-17 NOTE — Therapy (Signed)
[facility] [address] [place], [place], [address] Phone: [phone]   Fax:  [phone]  Physical Therapy Treatment  Patient Details  Name: Lynn [name] MRN: [id number] Date of Birth: 06-09-1950 Referring Provider: Teresa Coombs   Encounter Date: 11/17/2017  PT End of Session - 11/17/17 1046    Visit Number  7    Number of Visits  12    Date for PT Re-Evaluation  12/02/17    PT Start Time  1016    PT Stop Time  1102    PT Time Calculation (min)  46 min    Activity Tolerance  Patient tolerated treatment well    Behavior During Therapy  [facility] for tasks assessed/performed       Past Medical History:  Diagnosis Date  . Hyperlipidemia   . Hypothyroidism     Past Surgical History:  Procedure Laterality Date  . KNEE ARTHROSCOPY    . PARTIAL HYSTERECTOMY    . TONSILLECTOMY    . TUBAL LIGATION      There were no vitals filed for this visit.  Subjective Assessment - 11/17/17 1045    Subjective  Pt with increased soreness in knee today, no increased activity in last few days.     Currently in Pain?  Yes    Pain Score  5     Pain Location  Knee    Pain Orientation  Left    Pain Type  Acute pain    Pain Onset  More than a month ago    Pain Frequency  Intermittent                       [facility] Adult PT Treatment/Exercise - 11/17/17 1027      Knee/Hip Exercises: Stretches   Active Hamstring Stretch  3 reps;30 seconds;Left with strap    Passive Hamstring Stretch  Limitations    Passive Hamstring Stretch Limitations  Supine with strap, and ankle pump 2 x 20;     Gastroc Stretch  3 reps;30 seconds;Limitations    Gastroc Stretch Limitations  off step      Knee/Hip Exercises: Aerobic   Stationary Bike  L2 x 8 min      Knee/Hip Exercises: Standing   Heel Raises  2 sets;10 reps    Knee Flexion  2 sets;10 reps;Both    Hip Abduction  10 reps;Both;3 sets    Other Standing Knee Exercises  Tandem Stance 30 sec x2  bil, with UE flex 2x10 bil;     Other Standing Knee Exercises  March/Walk 15 ft x4;       Knee/Hip Exercises: Seated   Long Arc Quad  Strengthening;20 reps;Weights 5 sec hold    Long Arc Quad Weight  3 lbs.    Hamstring Curl  --    Hamstring Limitations  --      Knee/Hip Exercises: Supine   Straight Leg Raises  Left;2 sets;10 reps;Both      Knee/Hip Exercises: Sidelying   Hip ABduction  2 sets;10 reps      Iontophoresis   Type of Iontophoresis  --    Location  --    Dose  --    Time  --      Manual Therapy   Manual Therapy  Passive ROM;Joint mobilization;Taping    Joint Mobilization  To increase L knee flex and ext    Passive ROM  For L flex/ext    Kinesiotex  Ligament Correction  Kinesiotix   Ligament Correction  I strip for MCL; and 2 I strips for medial support;                PT Short Term Goals - 11/03/17 1141      PT SHORT TERM GOAL #1   Title  Pt to be independent with initial HEP     Time  2    Period  Weeks    Status  Achieved        PT Long Term Goals - 10/22/17 2144      PT LONG TERM GOAL #1   Title  Pt to demo improved L knee strength to at least 4+/5 to improve stability and pain.     Time  6    Period  Weeks    Status  New    Target Date  12/02/17      PT LONG TERM GOAL #2   Title  Pt to report decreased pain in L knee, to 0-2/10 with standing activity     Time  6    Period  Weeks    Status  New    Target Date  12/02/17      PT LONG TERM GOAL #3   Title  Pt to demo ability to navigate 5 steps with out pain greater than 2/10, to improve ability for home and community navigation     Time  6    Period  Weeks    Status  New    Target Date  12/02/17            Plan - 11/17/17 1115    Clinical Impression Statement  Pt with tenderness at medial knee today, decreased tenderness at posterior knee. Improving ROM for full flex/ext, but painful at end range for flexion. She is improving with ability for ther ex, no increased pain  with activities today. Pt going out of town for 2 weeks, will return to PT and MD visit when she returns. Recommend continued care.     PT Treatment/Interventions  ADLs/Self Care Home Management;Cryotherapy;Electrical Stimulation;Iontophoresis 4mg /ml Dexamethasone;Moist Heat;Therapeutic activities;Functional mobility training;Stair training;Gait training;Ultrasound;Therapeutic exercise;Balance training;Neuromuscular re-education;Patient/family education;Orthotic Fit/Training;Dry needling;Passive range of motion;Manual techniques;Taping;Vasopneumatic Device    PT Next Visit Plan  assess ionto and continue as indicated.        Patient will benefit from skilled therapeutic intervention in order to improve the following deficits and impairments:  Abnormal gait, Decreased endurance, Decreased activity tolerance, Decreased strength, Pain, Increased muscle spasms, Decreased mobility, Decreased balance, Difficulty walking  Visit Diagnosis: Acute pain of left knee     Problem List Patient Active Problem List   Diagnosis Date Noted  . Primary osteoarthritis of left knee 10/18/2017  . Elevated BP without diagnosis of hypertension 11/05/2016  . Physical exam 04/22/2016  . Hypothyroidism 10/24/2015  . Hyperlipidemia 10/24/2015  . Obesity (BMI 35.0-39.9 without comorbidity) 10/24/2015    Lyndee Hensen, PT, DPT 11:16 AM  11/17/17    [facility] [place], [place], [address] Phone: [phone]   Fax:  [phone]  Name: VERBA AINLEY MRN: [id number] Date of Birth: 1951/02/10

## 2017-12-06 ENCOUNTER — Encounter: Payer: Self-pay | Admitting: Sports Medicine

## 2017-12-06 ENCOUNTER — Ambulatory Visit (INDEPENDENT_AMBULATORY_CARE_PROVIDER_SITE_OTHER): Payer: Medicare Other | Admitting: Sports Medicine

## 2017-12-06 ENCOUNTER — Ambulatory Visit (INDEPENDENT_AMBULATORY_CARE_PROVIDER_SITE_OTHER): Payer: Medicare Other | Admitting: Physical Therapy

## 2017-12-06 ENCOUNTER — Encounter: Payer: Self-pay | Admitting: Physical Therapy

## 2017-12-06 VITALS — BP 122/86 | HR 89 | Ht 62.0 in | Wt 195.2 lb

## 2017-12-06 DIAGNOSIS — M25562 Pain in left knee: Secondary | ICD-10-CM

## 2017-12-06 DIAGNOSIS — M25561 Pain in right knee: Secondary | ICD-10-CM

## 2017-12-06 DIAGNOSIS — M25462 Effusion, left knee: Secondary | ICD-10-CM

## 2017-12-06 DIAGNOSIS — G8929 Other chronic pain: Secondary | ICD-10-CM

## 2017-12-06 DIAGNOSIS — M1712 Unilateral primary osteoarthritis, left knee: Secondary | ICD-10-CM

## 2017-12-06 MED ORDER — MELOXICAM 15 MG PO TABS: 15.0000 mg | ORAL_TABLET | Freq: Every day | ORAL | 0 refills | Status: DC

## 2017-12-06 NOTE — Therapy (Signed)
[name] [address] [place], [place], [address] Phone: [phone]   Fax:  [phone]  Physical Therapy Treatment/Discharge  Patient Details  Name: Lynn [name] MRN: [id number] Date of Birth: 28-Dec-1950 Referring Provider: Teresa Coombs   Encounter Date: 12/06/2017  PT End of Session - 12/06/17 1029    Visit Number  8    Number of Visits  12    Date for PT Re-Evaluation  --    PT Start Time  1013    PT Stop Time  1051    PT Time Calculation (min)  38 min    Activity Tolerance  Patient tolerated treatment well    Behavior During Therapy  [facility] for tasks assessed/performed       Past Medical History:  Diagnosis Date  . Hyperlipidemia   . Hypothyroidism     Past Surgical History:  Procedure Laterality Date  . KNEE ARTHROSCOPY    . PARTIAL HYSTERECTOMY    . TONSILLECTOMY    . TUBAL LIGATION      There were no vitals filed for this visit.  Subjective Assessment - 12/06/17 1028    Subjective  Pt has been out of town for 2 weeks. She has increased pain when she was away, due to increased walking, but since she has been home for a couple days, pain has subsided back down to 2-3 at her "normal" ratings. Pt states no significant change in pain in Knees.     Limitations  Sitting;Walking;Standing;House hold activities    Currently in Pain?  Yes    Pain Score  4     Pain Location  Knee    Pain Orientation  Left    Pain Descriptors / Indicators  Aching    Pain Type  Acute pain    Pain Onset  More than a month ago    Pain Frequency  Intermittent         [facility] PT Assessment - 12/06/17 0001      AROM   Right Knee Extension  0    Right Knee Flexion  120    Left Knee Extension  0    Left Knee Flexion  134      Strength   Right Knee Flexion  5/5    Right Knee Extension  5/5    Left Knee Flexion  4+/5    Left Knee Extension  4+/5                   [facility] Adult PT Treatment/Exercise - 12/06/17 1017      Knee/Hip Exercises: Stretches   Active Hamstring Stretch  3 reps;30 seconds;Left with strap    Passive Hamstring Stretch  Limitations    Passive Hamstring Stretch Limitations  --    Gastroc Stretch  --    Gastroc Stretch Limitations  --      Knee/Hip Exercises: Aerobic   Stationary Bike  L2 x 8 min      Knee/Hip Exercises: Standing   Heel Raises  2 sets;10 reps    Knee Flexion  2 sets;10 reps;Both    Hip Abduction  10 reps;Both;3 sets    Other Standing Knee Exercises  --    Other Standing Knee Exercises  March/Walk 15 ft x4;       Knee/Hip Exercises: Seated   Long Arc Quad  Strengthening;20 reps;Weights 5 sec hold    Long Arc Quad Weight  3 lbs.    Hamstring Curl  Strengthening;Left;20 reps  Hamstring Limitations  GTB      Knee/Hip Exercises: Supine   Straight Leg Raises  2 sets;10 reps;Both      Knee/Hip Exercises: Sidelying   Hip ABduction  2 sets;10 reps      Manual Therapy   Manual Therapy  Passive ROM;Joint mobilization;Taping    Joint Mobilization  --    Passive ROM  --    Kinesiotex  --      Kinesiotix   Ligament Correction  --             PT Education - 12/06/17 1028    Education provided  Yes    Education Details  HEP, D/C plan    Person(s) Educated  Patient    Methods  Explanation;Handout;Demonstration;Verbal cues;Tactile cues    Comprehension  Verbalized understanding;Verbal cues required;Need further instruction       PT Short Term Goals - 11/03/17 1141      PT SHORT TERM GOAL #1   Title  Pt to be independent with initial HEP     Time  2    Period  Weeks    Status  Achieved        PT Long Term Goals - 12/06/17 1053      PT LONG TERM GOAL #1   Title  Pt to demo improved L knee strength to at least 4+/5 to improve stability and pain.     Time  6    Period  Weeks    Status  Achieved      PT LONG TERM GOAL #2   Title  Pt to report decreased pain in L knee, to 0-2/10 with standing activity     Time  6    Period  Weeks    Status   Partially Met      PT LONG TERM GOAL #3   Title  Pt to demo ability to navigate 5 steps with out pain greater than 2/10, to improve ability for home and community navigation     Time  6    Period  Weeks    Status  Partially Met            Plan - 12/06/17 1053    Clinical Impression Statement  Pt continues to have mild pain in knees, variable depending on activity. She states overall significant reduction in pain since eval, but has not continued to have signficant pain relief in last few weeks. She was educated on final HEP and benefits today. Pt will follow up with MD for possible injections in future. She has ROM and strength WNL at this time.  Remaining pain likely due to OA, pt will continue to work on strengthening with HEP. Pt ready for d/c to HEP at this time, pt in agreement with plan.     PT Treatment/Interventions  ADLs/Self Care Home Management;Cryotherapy;Electrical Stimulation;Iontophoresis 97m/ml Dexamethasone;Moist Heat;Therapeutic activities;Functional mobility training;Stair training;Gait training;Ultrasound;Therapeutic exercise;Balance training;Neuromuscular re-education;Patient/family education;Orthotic Fit/Training;Dry needling;Passive range of motion;Manual techniques;Taping;Vasopneumatic Device    PT Next Visit Plan  assess ionto and continue as indicated.        Patient will benefit from skilled therapeutic intervention in order to improve the following deficits and impairments:  Abnormal gait, Decreased endurance, Decreased activity tolerance, Decreased strength, Pain, Increased muscle spasms, Decreased mobility, Decreased balance, Difficulty walking  Visit Diagnosis: Acute pain of left knee     Problem List Patient Active Problem List   Diagnosis Date Noted  . Primary osteoarthritis of left knee 10/18/2017  . Elevated  BP without diagnosis of hypertension 11/05/2016  . Physical exam 04/22/2016  . Hypothyroidism 10/24/2015  . Hyperlipidemia 10/24/2015  .  Obesity (BMI 35.0-39.9 without comorbidity) 10/24/2015   Lyndee Hensen, PT, DPT 10:57 AM  12/06/17   [facility] [place], [place], [address] Phone: [phone]   Fax:  [phone]  Name: Lynn [name] MRN: [id number] Date of Birth: January 01, 1951  PHYSICAL THERAPY DISCHARGE SUMMARY  Visits from Start of Care: 8   Plan: Patient agrees to discharge.  Patient goals were met. Patient is being discharged due to meeting the stated rehab goals.  ?????    Lyndee Hensen, PT, DPT 10:58 AM  12/06/17

## 2017-12-06 NOTE — Progress Notes (Signed)
[name], [name] at [place] - 67 y.o. female MRN [id number]  Date of birth: December 25, 1950  Visit Date: 12/06/2017  PCP: [name], MD   Referred by: [name], MD  Scribe(s) for today's visit: Josepha Pigg, CMA  SUBJECTIVE:  Lynn [name] is here for Follow-up (L knee pain)   10/18/2017: Her L ant-med knee pain symptoms INITIALLY: Began on 09/19/17 when she was going downstairs when her L post knee popped Described as moderate aching pain, radiating to the L LE both the lower leg and thigh Worsened with climbing stairs Improved with ice and Meloxicam Additional associated symptoms include: L knee swelling and popping noted   At this time symptoms are improving compared to onset w/ decreased pain and swelling.  She notes 75% improvement She has been wearing OTC knee brace, using ice and taking Meloxicam as prescribed by Dr. Birdie Riddle.  Had L knee XR on 10/01/17. R knee hx- meniscal tears and MCL sprain  12/06/2017: Compared to the last office visit, her previously described symptoms are improving. She c/o continued swelling around the knee. She still has pain when first standing after siting for prolonged period of time.  Current symptoms are mild-moderate & are nonradiating She has been wearing knee brace off and on, feels that it has been beneficial. She was taking Meloxicam but the prescription ran out. She has been taking Advil with some relief. She has been icing her knee with some relief.   She has noticed increased pain in the R knee since she has been favoring it. She has noticed some swelling around the R knee. She has noticed clicking and popping in the R knee. She denies feeling that knee may give out on her.    REVIEW OF SYSTEMS: Reports night time disturbances. Denies fevers, chills, or night sweats. Denies unexplained weight loss. Denies personal history of  cancer. Denies changes in bowel or bladder habits. Denies recent unreported falls. Denies new or worsening dyspnea or wheezing. Denies headaches or dizziness.  Denies numbness, tingling or weakness  In the extremities.  Denies dizziness or presyncopal episodes Reports lower extremity edema    HISTORY & PERTINENT PRIOR DATA:  Prior History reviewed and updated per electronic medical record.  Significant/pertinent history, findings, studies include:  reports that she has never smoked. She has never used smokeless tobacco. No results for input(s): HGBA1C, LABURIC, CREATINE in the last 8760 hours. No specialty comments available. No problems updated.  OBJECTIVE:  VS:  HT:5\' 2"  (157.5 cm)   WT:195 lb 3.2 oz (88.5 kg)  BMI:35.69    BP:122/86  HR:89bpm  TEMP: ( )  RESP:96 %   PHYSICAL EXAM: Constitutional: WDWN, Non-toxic appearing. Psychiatric: Alert & appropriately interactive.  Not depressed or anxious appearing. Respiratory: No increased work of breathing.  Trachea Midline Eyes: Pupils are equal.  EOM intact without nystagmus.  No scleral icterus  Vascular Exam: warm to touch no edema  lower extremity neuro exam: unremarkable normal strength normal sensation  MSK Exam: Left knee has a small to moderate effusion today.  She is ligamentously stable with 2 to 3 mm of opening with valgus stressing.  She has pain with patellar grind.  Right knee has no significant effusion.  Medial and lateral joint line pain bilaterally.  Full flexion extension bilaterally.   ASSESSMENT & PLAN:   1. Chronic pain of both knees   2. Primary osteoarthritis of  left knee   3. Effusion of left knee     PLAN: We will go ahead and get her preapproved for Zilretta injections for the left knee given the recurrent effusion and have her continue working with physical therapy as this has been beneficial for her.  We will have her resume Mobic and discussed intermittent bursting of NSAIDs.  If any  lack of improvement with the right knee we can consider injections.  Follow-up: Return in about 1 month (around 01/03/2018).      Please see additional documentation for Objective, Assessment and Plan sections. Pertinent additional documentation may be included in corresponding procedure notes, imaging studies, problem based documentation and patient instructions. Please see these sections of the encounter for additional information regarding this visit.  CMA/ATC served as Education administrator during this visit. History, Physical, and Plan performed by medical provider. Documentation and orders reviewed and attested to.      Gerda Diss, [facility] Physician

## 2017-12-06 NOTE — Patient Instructions (Signed)
Access Code: [email or web address]  URL: [email or web address]  Date: 12/06/2017  Prepared by: Lyndee Hensen   Exercises  Seated Hamstring Stretch - 3 reps - 30 hold - 2x daily  Supine Heel Slides - 10 reps - 2 sets - 2x daily  Long Sitting Quad Set - 10 reps - 2 sets - 3 hold - 2x daily  Seated Long Arc Quad - 10 reps - 2 sets - 2x daily  Straight Leg Raise - 10 reps - 2 sets - 2x daily  Seated Hamstring Curl with Anchored Resistance - 10 reps - 2 sets - 2x daily  Standing Hip Abduction - 10 reps - 2 sets - 2x daily  Standing Marching - 10 reps - 2 sets - 2x daily

## 2018-01-06 ENCOUNTER — Encounter: Payer: Self-pay | Admitting: Sports Medicine

## 2018-01-06 ENCOUNTER — Ambulatory Visit: Payer: Self-pay

## 2018-01-06 ENCOUNTER — Ambulatory Visit (INDEPENDENT_AMBULATORY_CARE_PROVIDER_SITE_OTHER): Payer: Medicare Other | Admitting: Sports Medicine

## 2018-01-06 VITALS — BP 112/88 | HR 80 | Ht 62.0 in | Wt 196.4 lb

## 2018-01-06 DIAGNOSIS — M25462 Effusion, left knee: Secondary | ICD-10-CM | POA: Diagnosis not present

## 2018-01-06 DIAGNOSIS — M1712 Unilateral primary osteoarthritis, left knee: Secondary | ICD-10-CM

## 2018-01-06 NOTE — Progress Notes (Signed)
Lynn [name]. Lynn [name], [place] at [place] - 67 y.o. female MRN [id number]  Date of birth: 24-Oct-1950  Visit Date: 01/06/2018  PCP: [name], MD   Referred by: [name], MD  Scribe(s) for today's visit: Josepha Pigg, CMA  SUBJECTIVE:  Lynn [name] is here for Follow-up (L knee pain)   10/18/2017: Her L ant-med knee pain symptoms INITIALLY: Began on 09/19/17 when she was going downstairs when her L post knee popped Described as moderate aching pain, radiating to the L LE both the lower leg and thigh Worsened with climbing stairs Improved with ice and Meloxicam Additional associated symptoms include: L knee swelling and popping noted   At this time symptoms are improving compared to onset w/ decreased pain and swelling.  She notes 75% improvement She has been wearing OTC knee brace, using ice and taking Meloxicam as prescribed by Dr. Birdie Riddle.  Had L knee XR on 10/01/17. R knee hx- meniscal tears and MCL sprain  12/06/2017: Compared to the last office visit, her previously described symptoms are improving. She c/o continued swelling around the knee. She still has pain when first standing after siting for prolonged period of time.  Current symptoms are mild-moderate & are nonradiating She has been wearing knee brace off and on, feels that it has been beneficial. She was taking Meloxicam but the prescription ran out. She has been taking Advil with some relief. She has been icing her knee with some relief.  She has noticed increased pain in the R knee since she has been favoring it. She has noticed some swelling around the R knee. She has noticed clicking and popping in the R knee. She denies feeling that knee may give out on her.   01/06/2018: Compared to the last office visit, her previously described symptoms are improving. She still has some pain but "it doesn't hurt like it did". She is no longer  having clicking and popping in her knee and she doesn't feel like it is going to give out on her anymore.  Current symptoms are mild-moderate & are radiating to her legs, she reports that this is because she didn't sleep well last night.  She has been wearing knee brace off and on. She takes Advil of Meloxicam prn. She was able to do the exercise bike without experiencing popping in her knee.  She received steroid injection    REVIEW OF SYSTEMS: Reports night time disturbances. Denies fevers, chills, or night sweats. Denies unexplained weight loss. Denies personal history of cancer. Denies changes in bowel or bladder habits. Denies recent unreported falls. Denies new or worsening dyspnea or wheezing. Denies headaches or dizziness.  Denies numbness, tingling or weakness  In the extremities.  Denies dizziness or presyncopal episodes Reports lower extremity edema    HISTORY & PERTINENT PRIOR DATA:  Prior History reviewed and updated per electronic medical record.  Significant/pertinent history, findings, studies include:  reports that she has never smoked. She has never used smokeless tobacco. No results for input(s): HGBA1C, LABURIC, CREATINE in the last 8760 hours. 12/06/17: Zilretta - no PA required.  [place] [facility] has 20% co-ins.  $185 deductible has already been met.  BCBS supplement will cover the co-insurance. -MW No problems updated.  OBJECTIVE:  VS:  HT:'5\' 2"'$  (157.5 cm)   WT:196 lb 6.4 oz (89.1 kg)  BMI:35.91    BP:112/88  HR:80bpm  TEMP: ( )  RESP:97 %  PHYSICAL EXAM: Constitutional: WDWN, Non-toxic appearing. Psychiatric: Alert & appropriately interactive.  Not depressed or anxious appearing. Respiratory: No increased work of breathing.  Trachea Midline Eyes: Pupils are equal.  EOM intact without nystagmus.  No scleral icterus  Vascular Exam: warm to touch no edema  lower extremity neuro exam: unremarkable  MSK Exam: Left knee has overall good alignment with  large effusion.  She has generalized medial and lateral joint line pain.   PROCEDURES & DATA REVIEWED:  Ultrasound-guided aspiration injection performed  ASSESSMENT & PLAN:   1. Primary osteoarthritis of left knee   2. Effusion of left knee     PLAN: Follow-up in 12 to 13 weeks for consideration of repeat injection  Follow-up: Return in about 3 months (around 04/07/2018).      Please see additional documentation for Objective, Assessment and Plan sections. Pertinent additional documentation may be included in corresponding procedure notes, imaging studies, problem based documentation and patient instructions. Please see these sections of the encounter for additional information regarding this visit.  CMA/ATC served as Education administrator during this visit. History, Physical, and Plan performed by medical provider. Documentation and orders reviewed and attested to.      Gerda Diss, [facility] Physician

## 2018-01-06 NOTE — Procedures (Signed)
PROCEDURE NOTE:  Ultrasound Guided: Aspiration and Injection: Left knee Images were obtained and interpreted by myself, Teresa Coombs, DO  Images have been saved and stored to PACS system. Images obtained on: GE S7 Ultrasound machine    ULTRASOUND FINDINGS:  Large effusion  DESCRIPTION OF PROCEDURE:  The patient's clinical condition is marked by substantial pain and/or significant functional disability. Other conservative therapy has not provided relief, is contraindicated, or not appropriate. There is a reasonable likelihood that injection will significantly improve the patient's pain and/or functional impairment.   After discussing the risks, benefits and expected outcomes of the injection and all questions were reviewed and answered, the patient wished to undergo the above named procedure.  Verbal consent was obtained.  The ultrasound was used to identify the target structure and adjacent neurovascular structures. The skin was then prepped in sterile fashion and the target structure was injected under direct visualization using sterile technique as below:  Single injection performed as below: PREP: Alcohol, Ethel Chloride and 5 cc 1% lidocaine on 25g 1.5 in. needle APPROACH:superiolateral, stopcock technique, 18g 1.5 in. INJECTATE: 5 cc 32mg  Zilretta (32mg /33mL sustained release triamcinolone) ASPIRATE: 40cc serous fluid DRESSING: Band-Aid and 6-inch Ace Wrap  Post procedural instructions including recommending icing and warning signs for infection were reviewed.    This procedure was well tolerated and there were no complications.   IMPRESSION: Succesful Ultrasound Guided: Injection

## 2018-01-06 NOTE — Patient Instructions (Signed)

## 2018-01-22 ENCOUNTER — Other Ambulatory Visit: Payer: Self-pay | Admitting: Family Medicine

## 2018-02-02 ENCOUNTER — Other Ambulatory Visit: Payer: Self-pay | Admitting: Family Medicine

## 2018-02-08 ENCOUNTER — Other Ambulatory Visit: Payer: Medicare Other

## 2018-02-15 ENCOUNTER — Other Ambulatory Visit: Payer: Self-pay | Admitting: Family Medicine

## 2018-02-18 ENCOUNTER — Other Ambulatory Visit: Payer: Self-pay

## 2018-02-18 ENCOUNTER — Encounter: Payer: Self-pay | Admitting: Physician Assistant

## 2018-02-18 ENCOUNTER — Ambulatory Visit (INDEPENDENT_AMBULATORY_CARE_PROVIDER_SITE_OTHER): Payer: Medicare Other | Admitting: Physician Assistant

## 2018-02-18 VITALS — BP 130/84 | HR 82 | Temp 98.5°F | Resp 16 | Ht 62.0 in | Wt 198.6 lb

## 2018-02-18 DIAGNOSIS — B9789 Other viral agents as the cause of diseases classified elsewhere: Secondary | ICD-10-CM

## 2018-02-18 DIAGNOSIS — J019 Acute sinusitis, unspecified: Secondary | ICD-10-CM | POA: Diagnosis not present

## 2018-02-18 MED ORDER — BENZONATATE 100 MG PO CAPS: 100.0000 mg | ORAL_CAPSULE | Freq: Three times a day (TID) | ORAL | 0 refills | Status: DC | PRN

## 2018-02-18 NOTE — Patient Instructions (Signed)
  Based on what you have shared with me it looks like you have sinusitis.  Sinusitis is inflammation and infection in the sinus cavities of the head.  Based on your presentation I believe you most likely have Acute Viral Sinusitis.This is an infection most likely caused by a virus. There is not specific treatment for viral sinusitis other than to help you with the symptoms until the infection runs its course.  You may use an oral decongestant such as Mucinex D or if you have glaucoma or high blood pressure use plain Mucinex. Saline nasal spray help and can safely be used as often as needed for congestion. Continue your saline nasal rinse. I have sent in a prescription cough medication that you can take with the Mucinex to help with cough.  Some authorities believe that zinc sprays or the use of Echinacea may shorten the course of your symptoms.  Sinus infections are not as easily transmitted as other respiratory infection, however we still recommend that you avoid close contact with loved ones, especially the very young and elderly.  Remember to wash your hands thoroughly throughout the day as this is the number one way to prevent the spread of infection!  Home Care:  Only take medications as instructed by your medical team.  Do not take these medications with alcohol.  A steam or ultrasonic humidifier can help congestion.  You can place a towel over your head and breathe in the steam from hot water coming from a faucet.  Avoid close contacts especially the very young and the elderly.  Cover your mouth when you cough or sneeze.  Always remember to wash your hands.  Get Help Right Away If:  You develop worsening fever or sinus pain.  You develop a severe head ache or visual changes.  Your symptoms persist after you have completed your treatment plan.  Make sure you  Understand these instructions.  Will watch your condition.  Will get help right away if you are not doing well or get  worse.  Your e-visit answers were reviewed by a board certified advanced clinical practitioner to complete your personal care plan.  Depending on the condition, your plan could have included both over the counter or prescription medications.  If there is a problem please reply  once you have received a response from your provider.  Your safety is important to [place].  If you have drug allergies check your prescription carefully.    You can use MyChart to ask questions about today's visit, request a non-urgent call back, or ask for a work or school excuse for 24 hours related to this e-Visit. If it has been greater than 24 hours you will need to follow up with your provider, or enter a new e-Visit to address those concerns.  You will get an e-mail in the next two days asking about your experience.  I hope that your e-visit has been valuable and will speed your recovery. Thank you for using e-visits.

## 2018-02-18 NOTE — Progress Notes (Signed)
Patient presents to clinic today c/o 2 days of sinus pressure, headache, cough and voice hoarseness and some mild scratchy throat. Denies sinus pain, ear pain or tooth pain. Just got back from [place] last night. Notes the air was very dry there. Has taken Mucinex-DM which has helped with symptoms.  Past Medical History:  Diagnosis Date  . Hyperlipidemia   . Hypothyroidism     Current Outpatient Medications on File Prior to Visit  Medication Sig Dispense Refill  . atorvastatin (LIPITOR) 40 MG tablet TAKE 1 TABLET(40 MG) BY MOUTH DAILY 90 tablet 1  . fluticasone (FLONASE) 50 MCG/ACT nasal spray SHAKE LIQUID AND USE 2 SPRAYS IN EACH NOSTRIL DAILY 16 g 3  . furosemide (LASIX) 40 MG tablet TAKE 1 TABLET BY MOUTH EVERY DAY 30 tablet 0  . levothyroxine (SYNTHROID, LEVOTHROID) 112 MCG tablet Take 1 tablet (112 mcg total) by mouth daily. 90 tablet 3  . MELATONIN PO Take 1 tablet by mouth at bedtime as needed.    . meloxicam (MOBIC) 15 MG tablet TAKE 1 TABLET BY MOUTH EVERY DAY 30 tablet 0  . Multiple Vitamin (MULTIVITAMIN) tablet Take 1 tablet by mouth daily.    . Probiotic Product (PROBIOTIC-10) CHEW Chew by mouth.    . promethazine (PHENERGAN) 25 MG tablet Take 25 mg by mouth every 6 (six) hours as needed for nausea or vomiting.     No current facility-administered medications on file prior to visit.     Allergies  Allergen Reactions  . Sulfa Antibiotics Nausea Only  . Brimonidine Tartrate     Family History  Problem Relation Age of Onset  . Colon cancer Father   . Cancer Father        skin  . Cancer Paternal Grandfather        skin  . Pancreatic cancer Neg Hx   . Stomach cancer Neg Hx     Social History   Socioeconomic History  . Marital status: Widowed    Spouse name: Not on file  . Number of children: Not on file  . Years of education: Not on file  . Highest education level: Not on file  Occupational History  . Not on file  Social Needs  . Financial resource strain:  Not on file  . Food insecurity:    Worry: Not on file    Inability: Not on file  . Transportation needs:    Medical: Not on file    Non-medical: Not on file  Tobacco Use  . Smoking status: Never Smoker  . Smokeless tobacco: Never Used  Substance and Sexual Activity  . Alcohol use: No  . Drug use: No  . Sexual activity: Not on file  Lifestyle  . Physical activity:    Days per week: Not on file    Minutes per session: Not on file  . Stress: Not on file  Relationships  . Social connections:    Talks on phone: Not on file    Gets together: Not on file    Attends religious service: Not on file    Active member of club or organization: Not on file    Attends meetings of clubs or organizations: Not on file    Relationship status: Not on file  Other Topics Concern  . Not on file  Social History Narrative  . Not on file   Review of Systems - See HPI.  All other ROS are negative.  BP 130/84   Pulse 82   Temp  98.5 F (36.9 C) (Oral)   Resp 16   Ht 5\' 2"  (1.575 m)   Wt 198 lb 9.6 oz (90.1 kg)   SpO2 95%   BMI 36.32 kg/m   Physical Exam  Constitutional: She appears well-developed and well-nourished.  HENT:  Head: Normocephalic and atraumatic.  Right Ear: Tympanic membrane and external ear normal.  Left Ear: Tympanic membrane and external ear normal.  Nose: Rhinorrhea present.  Mouth/Throat: Uvula is midline and oropharynx is clear and moist. No oropharyngeal exudate.  Neck: Neck supple.  Cardiovascular: Normal rate, regular rhythm, normal heart sounds and intact distal pulses.  Psychiatric: She has a normal mood and affect.  Vitals reviewed.  Assessment/Plan: 1. Acute viral sinusitis Increase fluids. Rest. Start Mucinex-DM and continue Flonase. Rx Tessalon. Reassurance given. Follow-up if symptoms not easing up. - benzonatate (TESSALON) 100 MG capsule; Take 1-2 capsules (100-200 mg total) by mouth 3 (three) times daily as needed for cough.  Dispense: 30 capsule;  Refill: 0   Leeanne Rio, PA-C

## 2018-02-21 ENCOUNTER — Other Ambulatory Visit: Payer: Self-pay

## 2018-02-21 ENCOUNTER — Other Ambulatory Visit: Payer: Medicare Other

## 2018-02-21 ENCOUNTER — Encounter: Payer: Self-pay | Admitting: Physician Assistant

## 2018-02-21 ENCOUNTER — Ambulatory Visit (INDEPENDENT_AMBULATORY_CARE_PROVIDER_SITE_OTHER): Payer: Medicare Other | Admitting: Physician Assistant

## 2018-02-21 VITALS — BP 128/84 | HR 81 | Temp 98.5°F | Resp 14 | Ht 62.0 in | Wt 199.0 lb

## 2018-02-21 DIAGNOSIS — J019 Acute sinusitis, unspecified: Secondary | ICD-10-CM | POA: Diagnosis not present

## 2018-02-21 DIAGNOSIS — B9689 Other specified bacterial agents as the cause of diseases classified elsewhere: Secondary | ICD-10-CM | POA: Diagnosis not present

## 2018-02-21 MED ORDER — AZITHROMYCIN 250 MG PO TABS: ORAL_TABLET | ORAL | 0 refills | Status: DC

## 2018-02-21 NOTE — Progress Notes (Signed)
Patient presents to clinic today c/o worsening sinus pressure, sinus pain, productive cough despite supportive measures and OTC medications for symptom control started at visit last week. Denies fever, chills or aches. Denies chest pain or SOB.   Past Medical History:  Diagnosis Date  . Hyperlipidemia   . Hypothyroidism     Current Outpatient Medications on File Prior to Visit  Medication Sig Dispense Refill  . atorvastatin (LIPITOR) 40 MG tablet TAKE 1 TABLET(40 MG) BY MOUTH DAILY 90 tablet 1  . benzonatate (TESSALON) 100 MG capsule Take 1-2 capsules (100-200 mg total) by mouth 3 (three) times daily as needed for cough. 30 capsule 0  . fluticasone (FLONASE) 50 MCG/ACT nasal spray SHAKE LIQUID AND USE 2 SPRAYS IN EACH NOSTRIL DAILY 16 g 3  . furosemide (LASIX) 40 MG tablet TAKE 1 TABLET BY MOUTH EVERY DAY 30 tablet 0  . levothyroxine (SYNTHROID, LEVOTHROID) 112 MCG tablet Take 1 tablet (112 mcg total) by mouth daily. 90 tablet 3  . MELATONIN PO Take 1 tablet by mouth at bedtime as needed.    . meloxicam (MOBIC) 15 MG tablet TAKE 1 TABLET BY MOUTH EVERY DAY 30 tablet 0  . Multiple Vitamin (MULTIVITAMIN) tablet Take 1 tablet by mouth daily.    . Probiotic Product (PROBIOTIC-10) CHEW Chew by mouth.    . promethazine (PHENERGAN) 25 MG tablet Take 25 mg by mouth every 6 (six) hours as needed for nausea or vomiting.     No current facility-administered medications on file prior to visit.     Allergies  Allergen Reactions  . Sulfa Antibiotics Nausea Only  . Brimonidine Tartrate     Family History  Problem Relation Age of Onset  . Colon cancer Father   . Cancer Father        skin  . Cancer Paternal Grandfather        skin  . Pancreatic cancer Neg Hx   . Stomach cancer Neg Hx     Social History   Socioeconomic History  . Marital status: Widowed    Spouse name: Not on file  . Number of children: Not on file  . Years of education: Not on file  . Highest education level: Not  on file  Occupational History  . Not on file  Social Needs  . Financial resource strain: Not on file  . Food insecurity:    Worry: Not on file    Inability: Not on file  . Transportation needs:    Medical: Not on file    Non-medical: Not on file  Tobacco Use  . Smoking status: Never Smoker  . Smokeless tobacco: Never Used  Substance and Sexual Activity  . Alcohol use: No  . Drug use: No  . Sexual activity: Not on file  Lifestyle  . Physical activity:    Days per week: Not on file    Minutes per session: Not on file  . Stress: Not on file  Relationships  . Social connections:    Talks on phone: Not on file    Gets together: Not on file    Attends religious service: Not on file    Active member of club or organization: Not on file    Attends meetings of clubs or organizations: Not on file    Relationship status: Not on file  Other Topics Concern  . Not on file  Social History Narrative  . Not on file   Review of Systems - See HPI.  All other  ROS are negative.  BP 128/84   Pulse 81   Temp 98.5 F (36.9 C) (Oral)   Resp 14   Ht 5\' 2"  (1.575 m)   Wt 199 lb (90.3 kg)   SpO2 98%   BMI 36.40 kg/m   Physical Exam  Constitutional: She appears well-developed and well-nourished.  HENT:  Head: Normocephalic and atraumatic.  Left Ear: Tympanic membrane normal.  Nose: Mucosal edema and rhinorrhea present. Right sinus exhibits frontal sinus tenderness. Left sinus exhibits frontal sinus tenderness.  Mouth/Throat: Uvula is midline, oropharynx is clear and moist and mucous membranes are normal.  Eyes: Conjunctivae are normal.  Neck: Neck supple.  Cardiovascular: Normal rate, regular rhythm, normal heart sounds and intact distal pulses.  Pulmonary/Chest: Effort normal and breath sounds normal.  Lymphadenopathy:    She has no cervical adenopathy.  Psychiatric: She has a normal mood and affect.  Vitals reviewed.  Assessment/Plan: 1. Acute bacterial sinusitis Discussed  first-line antibiotic. Patient requesting Z-pack as she states it is the only medication that works for sinuses. Rx Azithromycin.  Increase fluids.  Rest.  Saline nasal spray.  Probiotic.  Mucinex as directed.  Humidifier in bedroom.  Call or return to clinic if symptoms are not improving.  - azithromycin (ZITHROMAX) 250 MG tablet; Take 2 tablets on Day 1. Then take 1 tablet daily.  Dispense: 6 tablet; Refill: 0   Leeanne Rio, PA-C

## 2018-02-21 NOTE — Patient Instructions (Signed)
Please take antibiotic as directed.  Increase fluid intake.  Use Saline nasal spray.  Take a daily multivitamin. Continue the Tessalon for cough and other supportive measures reviewed at last visit.  Place a humidifier in the bedroom. Again, Azithromycin is not first-line for sinusitis. You have noted it works well for you but if not improving in 48 hours, please call me. Please call or return clinic if symptoms are not improving.  Sinusitis Sinusitis is redness, soreness, and swelling (inflammation) of the paranasal sinuses. Paranasal sinuses are air pockets within the bones of your face (beneath the eyes, the middle of the forehead, or above the eyes). In healthy paranasal sinuses, mucus is able to drain out, and air is able to circulate through them by way of your nose. However, when your paranasal sinuses are inflamed, mucus and air can become trapped. This can allow bacteria and other germs to grow and cause infection. Sinusitis can develop quickly and last only a short time (acute) or continue over a long period (chronic). Sinusitis that lasts for more than 12 weeks is considered chronic.  CAUSES  Causes of sinusitis include:  Allergies.  Structural abnormalities, such as displacement of the cartilage that separates your nostrils (deviated septum), which can decrease the air flow through your nose and sinuses and affect sinus drainage.  Functional abnormalities, such as when the small hairs (cilia) that line your sinuses and help remove mucus do not work properly or are not present. SYMPTOMS  Symptoms of acute and chronic sinusitis are the same. The primary symptoms are pain and pressure around the affected sinuses. Other symptoms include:  Upper toothache.  Earache.  Headache.  Bad breath.  Decreased sense of smell and taste.  A cough, which worsens when you are lying flat.  Fatigue.  Fever.  Thick drainage from your nose, which often is green and may contain pus  (purulent).  Swelling and warmth over the affected sinuses. DIAGNOSIS  Your caregiver will perform a physical exam. During the exam, your caregiver may:  Look in your nose for signs of abnormal growths in your nostrils (nasal polyps).  Tap over the affected sinus to check for signs of infection.  View the inside of your sinuses (endoscopy) with a special imaging device with a light attached (endoscope), which is inserted into your sinuses. If your caregiver suspects that you have chronic sinusitis, one or more of the following tests may be recommended:  Allergy tests.  Nasal culture A sample of mucus is taken from your nose and sent to a lab and screened for bacteria.  Nasal cytology A sample of mucus is taken from your nose and examined by your caregiver to determine if your sinusitis is related to an allergy. TREATMENT  Most cases of acute sinusitis are related to a viral infection and will resolve on their own within 10 days. Sometimes medicines are prescribed to help relieve symptoms (pain medicine, decongestants, nasal steroid sprays, or saline sprays).  However, for sinusitis related to a bacterial infection, your caregiver will prescribe antibiotic medicines. These are medicines that will help kill the bacteria causing the infection.  Rarely, sinusitis is caused by a fungal infection. In theses cases, your caregiver will prescribe antifungal medicine. For some cases of chronic sinusitis, surgery is needed. Generally, these are cases in which sinusitis recurs more than 3 times per year, despite other treatments. HOME CARE INSTRUCTIONS   Drink plenty of water. Water helps thin the mucus so your sinuses can drain more easily.  Use a humidifier.  Inhale steam 3 to 4 times a day (for example, sit in the bathroom with the shower running).  Apply a warm, moist washcloth to your face 3 to 4 times a day, or as directed by your caregiver.  Use saline nasal sprays to help moisten and  clean your sinuses.  Take over-the-counter or prescription medicines for pain, discomfort, or fever only as directed by your caregiver. SEEK IMMEDIATE MEDICAL CARE IF:  You have increasing pain or severe headaches.  You have nausea, vomiting, or drowsiness.  You have swelling around your face.  You have vision problems.  You have a stiff neck.  You have difficulty breathing. MAKE SURE YOU:   Understand these instructions.  Will watch your condition.  Will get help right away if you are not doing well or get worse. Document Released: 05/25/2005 Document Revised: 08/17/2011 Document Reviewed: 06/09/2011 [facility] Patient Information 2014 [place], [place].

## 2018-02-28 ENCOUNTER — Other Ambulatory Visit: Payer: Self-pay | Admitting: Family Medicine

## 2018-03-02 ENCOUNTER — Other Ambulatory Visit: Payer: Self-pay | Admitting: Family Medicine

## 2018-03-07 ENCOUNTER — Other Ambulatory Visit: Payer: Self-pay | Admitting: General Practice

## 2018-03-07 ENCOUNTER — Other Ambulatory Visit (INDEPENDENT_AMBULATORY_CARE_PROVIDER_SITE_OTHER): Payer: Medicare Other

## 2018-03-07 ENCOUNTER — Encounter: Payer: Self-pay | Admitting: Sports Medicine

## 2018-03-07 ENCOUNTER — Ambulatory Visit (INDEPENDENT_AMBULATORY_CARE_PROVIDER_SITE_OTHER): Payer: Medicare Other | Admitting: Sports Medicine

## 2018-03-07 VITALS — BP 122/86 | HR 93 | Ht 62.0 in | Wt 193.4 lb

## 2018-03-07 DIAGNOSIS — M1712 Unilateral primary osteoarthritis, left knee: Secondary | ICD-10-CM

## 2018-03-07 DIAGNOSIS — M25551 Pain in right hip: Secondary | ICD-10-CM

## 2018-03-07 DIAGNOSIS — Z23 Encounter for immunization: Secondary | ICD-10-CM

## 2018-03-07 DIAGNOSIS — E038 Other specified hypothyroidism: Secondary | ICD-10-CM | POA: Diagnosis not present

## 2018-03-07 LAB — TSH: TSH: 0.18 u[IU]/mL — ABNORMAL LOW (ref 0.35–4.50)

## 2018-03-07 MED ORDER — LEVOTHYROXINE SODIUM 100 MCG PO TABS: 100.0000 ug | ORAL_TABLET | Freq: Every day | ORAL | 3 refills | Status: DC

## 2018-03-07 NOTE — Progress Notes (Addendum)
Lynn [name]. Lynn [name], [place] [facility] [place] - 67 y.o. female MRN [id number]  Date of birth: [birth year]  Visit Date: 03/07/2018  PCP: [name], MD   Referred by: [name], MD  Scribe(s) for today's visit: Josepha Pigg, CMA  SUBJECTIVE:  Lynn [name] is here for Follow-up (L knee)   HPI:  10/18/2017: Her L ant-med knee pain symptoms INITIALLY: Began on 09/19/17 when she was going downstairs when her L post knee popped Described as moderate aching pain, radiating to the L LE both the lower leg and thigh Worsened with climbing stairs Improved with ice and Meloxicam Additional associated symptoms include: L knee swelling and popping noted   At this time symptoms are improving compared to onset w/ decreased pain and swelling.  She notes 75% improvement She has been wearing OTC knee brace, using ice and taking Meloxicam as prescribed by Dr. Birdie Riddle.  Had L knee XR on 10/01/17. R knee hx- meniscal tears and MCL sprain  12/06/2017: Compared to the last office visit, her previously described symptoms are improving. She c/o continued swelling around the knee. She still has pain when first standing after siting for prolonged period of time.  Current symptoms are mild-moderate & are nonradiating She has been wearing knee brace off and on, feels that it has been beneficial. She was taking Meloxicam but the prescription ran out. She has been taking Advil with some relief. She has been icing her knee with some relief.  She has noticed increased pain in the R knee since she has been favoring it. She has noticed some swelling around the R knee. She has noticed clicking and popping in the R knee. She denies feeling that knee may give out on her.   01/06/2018: Compared to the last office visit, her previously described symptoms are improving. She still has some pain but "it doesn't hurt like it did". She is no longer  having clicking and popping in her knee and she doesn't feel like it is going to give out on her anymore.  Current symptoms are mild-moderate & are radiating to her legs, she reports that this is because she didn't sleep well last night.  She has been wearing knee brace off and on. She takes Advil of Meloxicam prn. She was able to do the exercise bike without experiencing popping in her knee.  She received steroid injection   03/07/2018: Compared to the last office visit, her previously described symptoms are improving. She has noticed some swelling around the knee. Aside from swelling, knee pain has improved over all.  Current symptoms are mild & are nonradiating She has been icing her knee at night with some relief. She has been taking Meloxicam or Advil prn with some relief.  She received Zilretta injection 01/06/18 and tolerated well. She had good response to Zilretta.   She c/o R hip pain which started about 1 month ago after walking around [facility].  Pain is lateral and radiates to the calf. Pain is described as aching. Sx worsen toward the end of the day.  She denies n/t in the RLE. She notes some swelling in the R leg. She denies LBP.  She tried some exercises and that helped for a little while.  Advil and/or Meloxicam provide no relief. She has riding stationary bike, this does not cause the pain to flare up.   REVIEW OF SYSTEMS: Reports night time disturbances.  Denies fevers, chills, or night sweats. Denies unexplained weight loss. Denies personal history of cancer. Denies changes in bowel or bladder habits. Denies recent unreported falls. Denies new or worsening dyspnea or wheezing. Denies headaches or dizziness.  Denies numbness, tingling or weakness  In the extremities.  Denies dizziness or presyncopal episodes Reports lower extremity edema    HISTORY:  Prior history reviewed and updated per electronic medical record.  Social History   Occupational History  . Not on  file  Tobacco Use  . Smoking status: Never Smoker  . Smokeless tobacco: Never Used  Substance and Sexual Activity  . Alcohol use: No  . Drug use: No  . Sexual activity: Not on file   Social History   Social History Narrative  . Not on file     DATA OBTAINED & REVIEWED:  No results for input(s): HGBA1C, LABURIC, CREATINE in the last 8760 hours. Problem  Pain of Right Hip Joint  Primary Osteoarthritis of Left Knee    OBJECTIVE:  VS:  HT:5\' 2"  (157.5 cm)   WT:193 lb 6.4 oz (87.7 kg)  BMI:35.36    BP:122/86  HR:93bpm  TEMP: ( )  RESP:96 %   PHYSICAL EXAM: Left knee is well aligned with a small amount of synovitis but no significant effusion.  She does have a ligamentously stable knee.  Extensor mechanism is intact.  No significant pain with McMurray's testing and only mild pain with palpation of the joint lines. Right hip has a small amount of discomfort that radiates into the right groin.  This is minimal.  There is a small amount of pain with internal and external rotation.  ASSESSMENT   1. Primary osteoarthritis of left knee   2. Pain of right hip joint     PLAN:  Pertinent additional documentation may be included in corresponding procedure notes, imaging studies, problem based documentation and patient instructions.  Procedures:  . None  Medications:  No orders of the defined types were placed in this encounter.  Discussion/Instructions: Primary osteoarthritis of left knee She has done well with Zilretta injections.  At this point we will let her continue with activities and discussed avoidance of exacerbating activities.  Can consider repeat injections if recurrent symptoms occur.  Pain of right hip joint Right hip pain is actually only mild in nature at this time.  It does seem consistent with early osteoarthritis.  We discussed appropriate avoidance activities including squatting bending and twisting.  If she has significant flareup we can consider further  diagnostic evaluation with x-rays and therapeutic intervention with intra-articular injection.  . Discussed red flag symptoms that warrant earlier emergent evaluation and patient voices understanding. . Activity modifications and the importance of avoiding exacerbating activities (limiting pain to no more than a 4 / 10 during or following activity) recommended and discussed.  Follow-up:  . Return if symptoms worsen or fail to improve.  . If any lack of improvement: consider further diagnostic evaluation with X-rays of the hip     CMA/ATC served as scribe during this visit. History, Physical, and Plan performed by medical provider. Documentation and orders reviewed and attested to.      Gerda Diss, [place] [facility] Physician

## 2018-03-07 NOTE — Patient Instructions (Signed)
Please perform the exercise program that we have prepared for you and gone over in detail on a daily basis.  In addition to the handout you were provided you can access your program through: [email or web address]   Your unique program code is:  [email or web address]

## 2018-03-11 ENCOUNTER — Telehealth: Payer: Self-pay | Admitting: Family Medicine

## 2018-03-11 NOTE — Telephone Encounter (Signed)
Forwarding to Dr. Paulla Fore. Rx for Voltaren gel pending is appropriate.

## 2018-03-11 NOTE — Telephone Encounter (Signed)
Copied from [place] [phone]. Topic: Quick Communication - See Telephone Encounter >> Mar 11, 2018 12:20 PM Blase Mess A wrote: CRM for notification. See Telephone encounter for: 03/11/18. Patient states that she was in to see Dr. Paulla Fore on Monday and the Provider was going to call in [facility].  The script was not called in.  Please call the script in [id number] - [place], [place] - [address] [place] [address] Phone: [phone] Fax: [phone]

## 2018-03-11 NOTE — Addendum Note (Signed)
Addended by: Jasper Loser on: 03/11/2018 04:29 PM   Modules accepted: Orders

## 2018-03-11 NOTE — Addendum Note (Signed)
Addended by: Jasper Loser on: 03/11/2018 04:27 PM   Modules accepted: Orders

## 2018-03-14 MED ORDER — DICLOFENAC SODIUM 1 % TD GEL: TRANSDERMAL | 2 refills | Status: DC

## 2018-03-14 NOTE — Addendum Note (Signed)
Addended by: Teresa Coombs D on: 03/14/2018 08:23 AM   Modules accepted: Orders

## 2018-03-30 ENCOUNTER — Other Ambulatory Visit: Payer: Self-pay | Admitting: Family Medicine

## 2018-03-31 ENCOUNTER — Other Ambulatory Visit: Payer: Self-pay | Admitting: Family Medicine

## 2018-04-05 ENCOUNTER — Encounter: Payer: Self-pay | Admitting: Sports Medicine

## 2018-04-05 ENCOUNTER — Ambulatory Visit (INDEPENDENT_AMBULATORY_CARE_PROVIDER_SITE_OTHER): Payer: Medicare Other | Admitting: Sports Medicine

## 2018-04-05 VITALS — BP 140/92 | HR 85 | Ht 62.0 in | Wt 195.4 lb

## 2018-04-05 DIAGNOSIS — M5410 Radiculopathy, site unspecified: Secondary | ICD-10-CM

## 2018-04-05 DIAGNOSIS — M25551 Pain in right hip: Secondary | ICD-10-CM

## 2018-04-05 MED ORDER — METHYLPREDNISOLONE 4 MG PO TBPK: ORAL_TABLET | ORAL | 0 refills | Status: DC

## 2018-04-05 MED ORDER — GABAPENTIN 300 MG PO CAPS: ORAL_CAPSULE | ORAL | 1 refills | Status: DC

## 2018-04-05 NOTE — Progress Notes (Signed)
Lynn [name]. Lynn [name], [name] at [place] - 67 y.o. female MRN [id number]  Date of birth: April 28, 1951  Visit Date: 04/05/2018  PCP: Lynn Minium, MD   Referred by: Lynn Minium, MD  Scribe(s) for today's visit: Wendy Poet, LAT, ATC  SUBJECTIVE:  Lynn [name] is here for Initial Assessment (L leg pain)   HPI:  10/18/2017: Her L ant-med knee pain symptoms INITIALLY: Began on 09/19/17 when she was going downstairs when her L post knee popped Described as moderate aching pain, radiating to the L LE both the lower leg and thigh Worsened with climbing stairs Improved with ice and Meloxicam Additional associated symptoms include: L knee swelling and popping noted   At this time symptoms are improving compared to onset w/ decreased pain and swelling.  She notes 75% improvement She has been wearing OTC knee brace, using ice and taking Meloxicam as prescribed by Dr. Birdie [name].  Had L knee XR on 10/01/17. R knee hx- meniscal tears and MCL sprain  12/06/2017: Compared to the last office visit, her previously described symptoms are improving. She c/o continued swelling around the knee. She still has pain when first standing after siting for prolonged period of time.  Current symptoms are mild-moderate & are nonradiating She has been wearing knee brace off and on, feels that it has been beneficial. She was taking Meloxicam but the prescription ran out. She has been taking Advil with some relief. She has been icing her knee with some relief.  She has noticed increased pain in the R knee since she has been favoring it. She has noticed some swelling around the R knee. She has noticed clicking and popping in the R knee. She denies feeling that knee may give out on her.   01/06/2018: Compared to the last office visit, her previously described symptoms are improving. She still has some pain but "it doesn't hurt like it did".  She is no longer having clicking and popping in her knee and she doesn't feel like it is going to give out on her anymore.  Current symptoms are mild-moderate & are radiating to her legs, she reports that this is because she didn't sleep well last night.  She has been wearing knee brace off and on. She takes Advil of Meloxicam prn. She was able to do the exercise bike without experiencing popping in her knee.  She received steroid injection   03/07/2018: Compared to the last office visit, her previously described symptoms are improving. She has noticed some swelling around the knee. Aside from swelling, knee pain has improved over all.  Current symptoms are mild & are nonradiating She has been icing her knee at night with some relief. She has been taking Meloxicam or Advil prn with some relief.  She received Zilretta injection 01/06/18 and tolerated well. She had good response to Zilretta.   She c/o R hip pain which started about 1 month ago after walking around [facility].  Pain is lateral and radiates to the calf. Pain is described as aching. Sx worsen toward the end of the day.  She denies n/t in the RLE. She notes some swelling in the R leg. She denies LBP.  She tried some exercises and that helped for a little while.  Advil and/or Meloxicam provide no relief. She has riding stationary bike, this does not cause the pain to flare up.   04/05/2018: Compared to the  last office visit on 03/07/18, her previously described L knee and lower leg symptoms are worsening as of last night when her L knee and lower leg pain woke her up at 3 am. Current symptoms are moderate aching pain & are radiating to L lower leg.  She states that she feels like her knee is swollen today but does not report any mechanical symptoms.  Pain is worsened w/ weight bearing. She has taken a Meloxicam but otherwise has not done anything to treat her L knee.  Had a Zilretta injection in L knee on 01/06/18 and feels this helped until  last night.  REVIEW OF SYSTEMS: Reports night time disturbances. Denies fevers, chills, or night sweats. Denies unexplained weight loss. Denies personal history of cancer. Denies changes in bowel or bladder habits. Denies recent unreported falls. Denies new or worsening dyspnea or wheezing. Denies headaches or dizziness.  Denies numbness, tingling or weakness  In the extremities.  Denies dizziness or presyncopal episodes Reports lower extremity edema    HISTORY:  Prior history reviewed and updated per electronic medical record.  Social History   Occupational History  . Not on file  Tobacco Use  . Smoking status: Never Smoker  . Smokeless tobacco: Never Used  Substance and Sexual Activity  . Alcohol use: No  . Drug use: No  . Sexual activity: Not on file   Social History   Social History Narrative  . Not on file    DATA OBTAINED & REVIEWED:  No results for input(s): HGBA1C, LABURIC, CREATINE in the last 8760 hours. . S/p Zilretta Injection - Left knee 03/07/18   OBJECTIVE:  VS:  HT:5\' 2"  (157.5 cm)   WT:195 lb 6.4 oz (88.6 kg)  BMI:35.73    BP:(!) 140/92  HR:85bpm  TEMP: ( )  RESP:97 %   PHYSICAL EXAM: CONSTITUTIONAL: Well-developed, Well-nourished and In no acute distress PSYCHIATRIC: Alert & appropriately interactive. and Not depressed or anxious appearing. RESPIRATORY: No increased work of breathing and Trachea Midline EYES: Pupils are equal., EOM intact without nystagmus. and No scleral icterus.  VASCULAR EXAM: Warm and well perfused NEURO: unremarkable  MSK Exam: Left leg  Well aligned, no significant deformity. No overlying skin changes.   RANGE OF MOTION & STRENGTH  Normal, non-painful Internal and external range of motion.   SPECIALITY TESTING:  Negative straight leg raise    ASSESSMENT   1. Pain of right hip joint   2. Radiculitis     PLAN:  Pertinent additional documentation may be included in corresponding procedure notes, imaging  studies, problem based documentation and patient instructions.  Procedures:  Home Therapeutic exercises prescribed per procedure note.      Medications:  Meds ordered this encounter  Medications  . DISCONTD: methylPREDNISolone (MEDROL DOSEPAK) 4 MG TBPK tablet    Sig: Take by mouth as directed. Take 6 tablets on the first day prescribed then as directed.    Dispense:  21 tablet    Refill:  0  . DISCONTD: gabapentin (NEURONTIN) 300 MG capsule    Sig: Start with 1 tab po qhs X 1 week, then increase to 1 tab po bid X 1 week then 1 tab po tid prn    Dispense:  90 capsule    Refill:  1   Discussion/Instructions: No problem-specific Assessment & Plan notes found for this encounter.  . Symptoms are consistent with likely exacerbation of lumbar spine radiculopathy. . Discussed the importance of the home therapeutic exercises as well as medications ordered  as above. . Discussed red flag symptoms that warrant earlier emergent evaluation and patient voices understanding. . Activity modifications and the importance of avoiding exacerbating activities (limiting pain to no more than a 4 / 10 during or following activity) recommended and discussed.  Follow-up:  . Return in about 4 weeks (around 05/03/2018).   . If any lack of improvement consider: consider further diagnostic evaluation with Advanced diagnostic imaging of the lumbar spine     CMA/ATC served as scribe during this visit. History, Physical, and Plan performed by medical provider. Documentation and orders reviewed and attested to.      Gerda Diss, [place] [facility] Physician

## 2018-04-05 NOTE — Progress Notes (Signed)

## 2018-04-08 ENCOUNTER — Ambulatory Visit: Payer: Medicare Other | Admitting: Sports Medicine

## 2018-04-11 ENCOUNTER — Ambulatory Visit: Payer: Medicare Other | Admitting: Family Medicine

## 2018-04-13 ENCOUNTER — Ambulatory Visit: Payer: Medicare Other | Admitting: Sports Medicine

## 2018-04-19 ENCOUNTER — Ambulatory Visit (INDEPENDENT_AMBULATORY_CARE_PROVIDER_SITE_OTHER): Payer: Medicare Other | Admitting: Sports Medicine

## 2018-04-19 ENCOUNTER — Ambulatory Visit: Payer: Self-pay

## 2018-04-19 ENCOUNTER — Encounter: Payer: Self-pay | Admitting: Sports Medicine

## 2018-04-19 VITALS — BP 138/88 | HR 87 | Ht 62.0 in | Wt 195.6 lb

## 2018-04-19 DIAGNOSIS — M25462 Effusion, left knee: Secondary | ICD-10-CM | POA: Diagnosis not present

## 2018-04-19 DIAGNOSIS — M1712 Unilateral primary osteoarthritis, left knee: Secondary | ICD-10-CM

## 2018-04-19 NOTE — Progress Notes (Signed)
Lynn [name]. Lynn [name], Lynn [name] - 67 y.o. female MRN [id number]  Date of birth: [birth year]  Visit Date: 04/19/2018  PCP: [name], MD   Referred by: [name], MD  Scribe(s) for today's visit: Josepha Pigg, CMA  SUBJECTIVE:  Lynn [name] is here for Follow-up (L knee pain)   HPI:  10/18/2017: Her L ant-med knee pain symptoms INITIALLY: Began on 09/19/17 when she was going downstairs when her L post knee popped Described as moderate aching pain, radiating to the L LE both the lower leg and thigh Worsened with climbing stairs Improved with ice and Meloxicam Additional associated symptoms include: L knee swelling and popping noted   At this time symptoms are improving compared to onset w/ decreased pain and swelling.  She notes 75% improvement She has been wearing OTC knee brace, using ice and taking Meloxicam as prescribed by Dr. Birdie Riddle.  Had L knee XR on 10/01/17. R knee hx- meniscal tears and MCL sprain  12/06/2017: Compared to the last office visit, her previously described symptoms are improving. She c/o continued swelling around the knee. She still has pain when first standing after siting for prolonged period of time.  Current symptoms are mild-moderate & are nonradiating She has been wearing knee brace off and on, feels that it has been beneficial. She was taking Meloxicam but the prescription ran out. She has been taking Advil with some relief. She has been icing her knee with some relief.  She has noticed increased pain in the R knee since she has been favoring it. She has noticed some swelling around the R knee. She has noticed clicking and popping in the R knee. She denies feeling that knee may give out on her.   01/06/2018: Compared to the last office visit, her previously described symptoms are improving. She still has some pain but "it doesn't hurt like it did". She is no  longer having clicking and popping in her knee and she doesn't feel like it is going to give out on her anymore.  Current symptoms are mild-moderate & are radiating to her legs, she reports that this is because she didn't sleep well last night.  She has been wearing knee brace off and on. She takes Advil of Meloxicam prn. She was able to do the exercise bike without experiencing popping in her knee.  She received steroid injection   03/07/2018: Compared to the last office visit, her previously described symptoms are improving. She has noticed some swelling around the knee. Aside from swelling, knee pain has improved over all.  Current symptoms are mild & are nonradiating She has been icing her knee at night with some relief. She has been taking Meloxicam or Advil prn with some relief.  She received Zilretta injection 01/06/18 and tolerated well. She had good response to Zilretta.   She c/o R hip pain which started about 1 month ago after walking around [facility].  Pain is lateral and radiates to the calf. Pain is described as aching. Sx worsen toward the end of the day.  She denies n/t in the RLE. She notes some swelling in the R leg. She denies LBP.  She tried some exercises and that helped for a little while.  Advil and/or Meloxicam provide no relief. She has riding stationary bike, this does not cause the pain to flare up.   04/05/2018: Compared to the last office  visit on 03/07/18, her previously described L knee and lower leg symptoms are worsening as of last night when her L knee and lower leg pain woke her up at 3 am. Current symptoms are moderate aching pain & are radiating to L lower leg.  She states that she feels like her knee is swollen today but does not report any mechanical symptoms.  Pain is worsened w/ weight bearing. She has taken a Meloxicam but otherwise has not done anything to treat her L knee.  Had a Zilretta injection in L knee on 01/06/18 and feels this helped until last  night.  04/19/2018: Compared to the last office visit, her previously described symptoms show no change. She went to [place] this oast weekend and did a lot of waling and has noticed more pain in both knees and thighs. She is also having pain in the lower back and gluteal region which is worse with standing for long periods of time. The L knee is very stiff after sitting for about 15 minutes. She has noticed some swelling.  Current symptoms are moderate & are radiating to the L shin.  She completed steroid taper and notes some relief with that. She has been taking Gabapentin 300 mg and Meloxicam at bedtime, it makes her too sleepy to take during the day. She has been doing HEP (pelvic recruitment and cat/camal) and riding her stationary bike every other day. She has also been using Voltaren gel.   REVIEW OF SYSTEMS: Reports night time disturbances. Denies fevers, chills, or night sweats. Denies unexplained weight loss. Denies personal history of cancer. Denies changes in bowel or bladder habits. Denies recent unreported falls. Denies new or worsening dyspnea or wheezing. Denies headaches or dizziness.  Denies numbness, tingling or weakness in the extremities.  Denies dizziness or presyncopal episodes Reports lower extremity edema    HISTORY:  Prior history reviewed and updated per electronic medical record.  Social History   Occupational History  . Not on file  Tobacco Use  . Smoking status: Never Smoker  . Smokeless tobacco: Never Used  Substance and Sexual Activity  . Alcohol use: No  . Drug use: No  . Sexual activity: Not on file   Social History   Social History Narrative  . Not on file    DATA OBTAINED & REVIEWED:  No results for input(s): HGBA1C, LABURIC, CREATINE in the last 8760 hours. . S/p Zilretta Injection - Left knee 03/07/18   OBJECTIVE:  VS:  HT:5\' 2"  (157.5 cm)   WT:195 lb 9.6 oz (88.7 kg)  BMI:35.77    BP:138/88  HR:87bpm  TEMP: ( )  RESP:96 %     PHYSICAL EXAM: CONSTITUTIONAL: Well-developed, Well-nourished and In no acute distress PSYCHIATRIC: Alert & appropriately interactive. and Not depressed or anxious appearing. RESPIRATORY: No increased work of breathing and Trachea Midline EYES: Pupils are equal., EOM intact without nystagmus. and No scleral icterus.  VASCULAR EXAM: Warm and well perfused NEURO: unremarkable  MSK Exam: Left leg  Well aligned, no significant deformity. No overlying skin changes. Moderate swelling of the knee.  Negative straight leg raise   RANGE OF MOTION & STRENGTH  Limited and painful: Terminal flexion   SPECIALITY TESTING:  Large effusion.  Ligamentously stable.  Good internal and external rotation of bilateral hips.  Pain with McMurray's.     ASSESSMENT   1. Primary osteoarthritis of left knee   2. Effusion of left knee     PLAN:  Pertinent additional documentation may be  included in corresponding procedure notes, imaging studies, problem based documentation and patient instructions.  Procedures:  . US Guided Injection per procedure note  Medications:  No orders of the defined types were placed in this encounter.  Discussion/Instructions: Primary osteoarthritis of left knee Repeat injection with Zilretta performed today.  She has increased her activity over the past several weekends with medication and we did discuss activity modification to minimize flareups.  She is aware that total knee arthroplasty is an option for her if she would like to pursue this at any time.  . Continue with compression . Continue previously prescribed home exercise program.  . Discussed red flag symptoms that warrant earlier emergent evaluation and patient voices understanding. . Activity modifications and the importance of avoiding exacerbating activities (limiting pain to no more than a 4 / 10 during or following activity) recommended and discussed.  Follow-up:  . Return in about 13 weeks (around  07/19/2018) for consideration of repeat injections.  . If any lack of improvement: consider referral to Orthopedics for Total knee arthroplasty . At follow up will plan: to consider repeat corticosteroid injections    CMA/ATC served as scribe during this visit. History, Physical, and Plan performed by medical provider. Documentation and orders reviewed and attested to.      Gerda Diss, [facility] Physician

## 2018-04-19 NOTE — Assessment & Plan Note (Signed)
Repeat injection with Zilretta performed today.  She has increased her activity over the past several weekends with medication and we did discuss activity modification to minimize flareups.  She is aware that total knee arthroplasty is an option for her if she would like to pursue this at any time.

## 2018-04-19 NOTE — Patient Instructions (Addendum)
You had an injection today.  Things to be aware of after injection are listed below: . You may experience no significant improvement or even a slight worsening in your symptoms during the first 24 to 48 hours.  After that we expect your symptoms to improve gradually over the next 2 weeks for the medicine to have its maximal effect.  You should continue to have improvement out to 6 weeks after your injection. . Dr. Ripley Lovecchio recommends icing the site of the injection for 20 minutes  1-2 times the day of your injection . You may shower but no swimming, tub bath or Jacuzzi for 24 hours. . If your bandage falls off this does not need to be replaced.  It is appropriate to remove the bandage after 4 hours. . You may resume light activities as tolerated unless otherwise directed per Dr. Artie Mcintyre during your visit  POSSIBLE STEROID SIDE EFFECTS:  Side effects from injectable steroids tend to be less than when taken orally however you may experience some of the symptoms listed below.  If experienced these should only last for a short period of time. Change in menstrual flow  Edema (swelling)  Increased appetite Skin flushing (redness)  Skin rash/acne  Thrush (oral) Yeast vaginitis    Increased sweating  Depression Increased blood glucose levels Cramping and leg/calf  Euphoria (feeling happy)  POSSIBLE PROCEDURE SIDE EFFECTS: The side effects of the injection are usually fairly minimal however if you may experience some of the following side effects that are usually self-limited and will is off on their own.  If you are concerned please feel free to call the office with questions:  Increased numbness or tingling  Nausea or vomiting  Swelling or bruising at the injection site   Please call our office if if you experience any of the following symptoms over the next week as these can be signs of infection:   Fever greater than 100.5F  Significant swelling at the injection site  Significant redness or drainage  from the injection site  If after 2 weeks you are continuing to have worsening symptoms please call our office to discuss what the next appropriate actions should be including the potential for a return office visit or other diagnostic testing.    

## 2018-04-19 NOTE — Procedures (Signed)
PROCEDURE NOTE:  Ultrasound Guided: Aspiration and Injection: Left knee, Zilretta injection Images were obtained and interpreted by myself, Teresa Coombs, DO  Images have been saved and stored to PACS system. Images obtained on: GE S7 Ultrasound machine    ULTRASOUND FINDINGS:  Large effusion, moderate synovitis.  Tricompartmental degenerative changes.  DESCRIPTION OF PROCEDURE:  The patient's clinical condition is marked by substantial pain and/or significant functional disability. Other conservative therapy has not provided relief, is contraindicated, or not appropriate. There is a reasonable likelihood that injection will significantly improve the patient's pain and/or functional impairment.   After discussing the risks, benefits and expected outcomes of the injection and all questions were reviewed and answered, the patient wished to undergo the above named procedure.  Verbal consent was obtained.  The ultrasound was used to identify the target structure and adjacent neurovascular structures. The skin was then prepped in sterile fashion and the target structure was injected under direct visualization using sterile technique as below:  Single injection performed as below: PREP: Alcohol, Ethel Chloride and 5 cc 1% lidocaine on 25g 1.5 in. needle APPROACH:superiolateral, stopcock technique, 18g 1.5 in. INJECTATE: 5cc Zilretta (32mg /80mL Sustained Release Triamcinolone) ASPIRATE: 35cc  and straw colored  DRESSING: Band-Aid  Post procedural instructions including recommending icing and warning signs for infection were reviewed.    This procedure was well tolerated and there were no complications.   IMPRESSION: Succesful Ultrasound Guided: Aspiration and Injection

## 2018-04-27 ENCOUNTER — Encounter: Payer: Self-pay | Admitting: Family Medicine

## 2018-04-27 ENCOUNTER — Ambulatory Visit (INDEPENDENT_AMBULATORY_CARE_PROVIDER_SITE_OTHER): Payer: Medicare Other | Admitting: Family Medicine

## 2018-04-27 ENCOUNTER — Other Ambulatory Visit: Payer: Self-pay

## 2018-04-27 ENCOUNTER — Other Ambulatory Visit: Payer: Self-pay | Admitting: Sports Medicine

## 2018-04-27 VITALS — BP 124/88 | HR 79 | Temp 98.2°F | Resp 16 | Ht 62.0 in | Wt 193.5 lb

## 2018-04-27 DIAGNOSIS — J302 Other seasonal allergic rhinitis: Secondary | ICD-10-CM

## 2018-04-27 DIAGNOSIS — E785 Hyperlipidemia, unspecified: Secondary | ICD-10-CM

## 2018-04-27 DIAGNOSIS — E038 Other specified hypothyroidism: Secondary | ICD-10-CM | POA: Diagnosis not present

## 2018-04-27 LAB — CBC WITH DIFFERENTIAL/PLATELET
Basophils Absolute: 0.1 10*3/uL (ref 0.0–0.1)
Basophils Relative: 1.3 % (ref 0.0–3.0)
EOS ABS: 0.1 10*3/uL (ref 0.0–0.7)
Eosinophils Relative: 1.5 % (ref 0.0–5.0)
HCT: 44.6 % (ref 36.0–46.0)
HEMOGLOBIN: 15 g/dL (ref 12.0–15.0)
Lymphocytes Relative: 27.1 % (ref 12.0–46.0)
Lymphs Abs: 1.9 10*3/uL (ref 0.7–4.0)
MCHC: 33.7 g/dL (ref 30.0–36.0)
MCV: 86 fl (ref 78.0–100.0)
MONO ABS: 0.4 10*3/uL (ref 0.1–1.0)
Monocytes Relative: 5.9 % (ref 3.0–12.0)
Neutro Abs: 4.6 10*3/uL (ref 1.4–7.7)
Neutrophils Relative %: 64.2 % (ref 43.0–77.0)
Platelets: 238 10*3/uL (ref 150.0–400.0)
RBC: 5.18 Mil/uL — AB (ref 3.87–5.11)
RDW: 13 % (ref 11.5–15.5)
WBC: 7.1 10*3/uL (ref 4.0–10.5)

## 2018-04-27 LAB — BASIC METABOLIC PANEL
BUN: 17 mg/dL (ref 6–23)
CHLORIDE: 104 meq/L (ref 96–112)
CO2: 31 meq/L (ref 19–32)
CREATININE: 1.03 mg/dL (ref 0.40–1.20)
Calcium: 9.4 mg/dL (ref 8.4–10.5)
GFR: 56.67 mL/min — ABNORMAL LOW (ref >60.00)
Glucose, Bld: 89 mg/dL (ref 70–99)
Potassium: 4.2 mEq/L (ref 3.5–5.1)
Sodium: 142 mEq/L (ref 135–145)

## 2018-04-27 LAB — LIPID PANEL
CHOL/HDL RATIO: 4
Cholesterol: 200 mg/dL (ref 0–200)
HDL: 53.8 mg/dL (ref >39.00)
LDL Cholesterol: 122 mg/dL — ABNORMAL HIGH (ref 0–99)
NonHDL: 146.12
TRIGLYCERIDES: 123 mg/dL (ref 0.0–149.0)
VLDL: 24.6 mg/dL (ref 0.0–40.0)

## 2018-04-27 LAB — HEPATIC FUNCTION PANEL
ALBUMIN: 4.1 g/dL (ref 3.5–5.2)
ALT: 12 U/L (ref 0–35)
AST: 14 U/L (ref 0–37)
Alkaline Phosphatase: 73 U/L (ref 39–117)
Bilirubin, Direct: 0.1 mg/dL (ref 0.0–0.3)
Total Bilirubin: 0.8 mg/dL (ref 0.2–1.2)
Total Protein: 6.6 g/dL (ref 6.0–8.3)

## 2018-04-27 LAB — TSH: TSH: 3.03 u[IU]/mL (ref 0.35–4.50)

## 2018-04-27 MED ORDER — FUROSEMIDE 40 MG PO TABS: 40.0000 mg | ORAL_TABLET | Freq: Every day | ORAL | 3 refills | Status: DC

## 2018-04-27 NOTE — Patient Instructions (Signed)
Follow up in 6 months to recheck cholesterol and thyroid We'll notify you of your lab results and make any changes if needed Continue to work on healthy diet and regular exercise- you can do it! RESTART the Zyrtec daily in addition to the Mucinex Drink plenty of fluids Call with any questions or concerns Happy Holidays!!!

## 2018-04-27 NOTE — Progress Notes (Signed)
   Subjective:    Patient ID: Lynn [name], female    DOB: 1951/01/04, 67 y.o.   MRN: [id number]  HPI Hyperlipidemia- chronic problem, on 40mg  daily.  No CP, SOB, HAs, visual changes, abd pain, N/V.  Hypothyroid- chronic problem, on Levothyroxine 162mcg daily.  Pt reports energy level is stable.  No changes to skin/hair/nails.  Nasal congestion- sxs started a few weeks ago.  No fevers.  Taking Mucinex and doing sinus rinse.  No ear pain.  + PND and subsequent cough.  No regular sinus pain/pressure.   Review of Systems For ROS see HPI     Objective:   Physical Exam  Constitutional: She is oriented to person, place, and time. She appears well-developed and well-nourished. No distress.  HENT:  Head: Normocephalic and atraumatic.  Right Ear: Tympanic membrane normal.  Left Ear: Tympanic membrane normal.  Nose: Mucosal edema and rhinorrhea present. Right sinus exhibits no maxillary sinus tenderness and no frontal sinus tenderness. Left sinus exhibits no maxillary sinus tenderness and no frontal sinus tenderness.  Mouth/Throat: Mucous membranes are normal. Posterior oropharyngeal erythema (w/ PND) present.  Eyes: Pupils are equal, round, and reactive to light. Conjunctivae and EOM are normal.  Neck: Normal range of motion. Neck supple.  Cardiovascular: Normal rate, regular rhythm, normal heart sounds and intact distal pulses.  Pulmonary/Chest: Effort normal and breath sounds normal. No respiratory distress. She has no wheezes. She has no rales.  Abdominal: Soft. She exhibits no distension and no mass. There is no tenderness. There is no guarding.  Lymphadenopathy:    She has no cervical adenopathy.  Neurological: She is alert and oriented to person, place, and time.  Skin: Skin is warm and dry.  Psychiatric: She has a normal mood and affect. Her behavior is normal. Thought content normal.  Vitals reviewed.         Assessment & Plan:  Allergic rhinitis- new.  Pt's congestion and  drainage consistent w/ allergies.  Restart Zyrtec.  Reviewed supportive care and red flags that should prompt return.  Pt expressed understanding and is in agreement w/ plan.

## 2018-04-27 NOTE — Assessment & Plan Note (Signed)
Chronic problem.  Currently asymptomatic.  Check labs.  Adjust meds prn  

## 2018-04-27 NOTE — Assessment & Plan Note (Signed)
Chronic problem.  Tolerating statin w/o difficulty.  Check labs.  Adjust meds prn  

## 2018-04-28 ENCOUNTER — Other Ambulatory Visit: Payer: Self-pay | Admitting: Family Medicine

## 2018-04-28 ENCOUNTER — Encounter: Payer: Self-pay | Admitting: General Practice

## 2018-05-01 ENCOUNTER — Other Ambulatory Visit: Payer: Self-pay | Admitting: Family Medicine

## 2018-05-04 ENCOUNTER — Encounter

## 2018-05-10 DIAGNOSIS — L570 Actinic keratosis: Secondary | ICD-10-CM | POA: Diagnosis not present

## 2018-05-16 ENCOUNTER — Telehealth: Payer: Self-pay | Admitting: Sports Medicine

## 2018-05-16 NOTE — Telephone Encounter (Signed)
See note  Copied from [place] [id number]. Topic: General - Other >> May 16, 2018 10:18 AM Carolyn Stare wrote:  Pt call to ask if Dr Paulla Fore would set her up for PT with  Lyndee Hensen

## 2018-05-16 NOTE — Telephone Encounter (Signed)
Unable to accept [employer] medicare as secondary.

## 2018-05-16 NOTE — Telephone Encounter (Signed)
Waiting on confirmation that Lynn [name] can see Medicare primary and [employer] medicare supp secondary.

## 2018-05-16 NOTE — Telephone Encounter (Signed)
Pt wants referral for R leg pain.

## 2018-05-17 ENCOUNTER — Other Ambulatory Visit: Payer: Self-pay

## 2018-05-17 DIAGNOSIS — M79604 Pain in right leg: Secondary | ICD-10-CM

## 2018-05-17 NOTE — Telephone Encounter (Signed)
Order placed for PT. Lynn [name], will you call pt and find out where she would like to go because Ander Purpura is unable to see pt with [facility]. Thanks!

## 2018-05-21 ENCOUNTER — Encounter: Payer: Self-pay | Admitting: Sports Medicine

## 2018-05-21 DIAGNOSIS — M25551 Pain in right hip: Secondary | ICD-10-CM | POA: Insufficient documentation

## 2018-05-21 NOTE — Assessment & Plan Note (Signed)
She has done well with Zilretta injections.  At this point we will let her continue with activities and discussed avoidance of exacerbating activities.  Can consider repeat injections if recurrent symptoms occur.

## 2018-05-21 NOTE — Assessment & Plan Note (Signed)
Right hip pain is actually only mild in nature at this time.  It does seem consistent with early osteoarthritis.  We discussed appropriate avoidance activities including squatting bending and twisting.  If she has significant flareup we can consider further diagnostic evaluation with x-rays and therapeutic intervention with intra-articular injection.

## 2018-05-25 DIAGNOSIS — M545 Low back pain: Secondary | ICD-10-CM | POA: Diagnosis not present

## 2018-05-25 DIAGNOSIS — M25551 Pain in right hip: Secondary | ICD-10-CM | POA: Diagnosis not present

## 2018-05-25 DIAGNOSIS — M79604 Pain in right leg: Secondary | ICD-10-CM | POA: Diagnosis not present

## 2018-05-25 DIAGNOSIS — M5431 Sciatica, right side: Secondary | ICD-10-CM | POA: Diagnosis not present

## 2018-05-31 DIAGNOSIS — M5431 Sciatica, right side: Secondary | ICD-10-CM | POA: Diagnosis not present

## 2018-05-31 DIAGNOSIS — M545 Low back pain: Secondary | ICD-10-CM | POA: Diagnosis not present

## 2018-05-31 DIAGNOSIS — M79604 Pain in right leg: Secondary | ICD-10-CM | POA: Diagnosis not present

## 2018-05-31 DIAGNOSIS — M25551 Pain in right hip: Secondary | ICD-10-CM | POA: Diagnosis not present

## 2018-06-06 DIAGNOSIS — M79604 Pain in right leg: Secondary | ICD-10-CM | POA: Diagnosis not present

## 2018-06-06 DIAGNOSIS — M545 Low back pain: Secondary | ICD-10-CM | POA: Diagnosis not present

## 2018-06-06 DIAGNOSIS — M5431 Sciatica, right side: Secondary | ICD-10-CM | POA: Diagnosis not present

## 2018-06-06 DIAGNOSIS — M25551 Pain in right hip: Secondary | ICD-10-CM | POA: Diagnosis not present

## 2018-06-10 DIAGNOSIS — M25551 Pain in right hip: Secondary | ICD-10-CM | POA: Diagnosis not present

## 2018-06-10 DIAGNOSIS — M545 Low back pain: Secondary | ICD-10-CM | POA: Diagnosis not present

## 2018-06-10 DIAGNOSIS — M5431 Sciatica, right side: Secondary | ICD-10-CM | POA: Diagnosis not present

## 2018-06-10 DIAGNOSIS — M79604 Pain in right leg: Secondary | ICD-10-CM | POA: Diagnosis not present

## 2018-06-13 DIAGNOSIS — M5431 Sciatica, right side: Secondary | ICD-10-CM | POA: Diagnosis not present

## 2018-06-13 DIAGNOSIS — M25551 Pain in right hip: Secondary | ICD-10-CM | POA: Diagnosis not present

## 2018-06-13 DIAGNOSIS — M79604 Pain in right leg: Secondary | ICD-10-CM | POA: Diagnosis not present

## 2018-06-13 DIAGNOSIS — M545 Low back pain: Secondary | ICD-10-CM | POA: Diagnosis not present

## 2018-06-15 ENCOUNTER — Ambulatory Visit: Payer: Medicare Other

## 2018-06-15 DIAGNOSIS — M79604 Pain in right leg: Secondary | ICD-10-CM | POA: Diagnosis not present

## 2018-06-15 DIAGNOSIS — M5431 Sciatica, right side: Secondary | ICD-10-CM | POA: Diagnosis not present

## 2018-06-15 DIAGNOSIS — M545 Low back pain: Secondary | ICD-10-CM | POA: Diagnosis not present

## 2018-06-15 DIAGNOSIS — M25551 Pain in right hip: Secondary | ICD-10-CM | POA: Diagnosis not present

## 2018-06-20 DIAGNOSIS — M79604 Pain in right leg: Secondary | ICD-10-CM | POA: Diagnosis not present

## 2018-06-20 DIAGNOSIS — M5431 Sciatica, right side: Secondary | ICD-10-CM | POA: Diagnosis not present

## 2018-06-20 DIAGNOSIS — M545 Low back pain: Secondary | ICD-10-CM | POA: Diagnosis not present

## 2018-06-20 DIAGNOSIS — M25551 Pain in right hip: Secondary | ICD-10-CM | POA: Diagnosis not present

## 2018-06-24 DIAGNOSIS — M5431 Sciatica, right side: Secondary | ICD-10-CM | POA: Diagnosis not present

## 2018-06-24 DIAGNOSIS — M25551 Pain in right hip: Secondary | ICD-10-CM | POA: Diagnosis not present

## 2018-06-24 DIAGNOSIS — M79604 Pain in right leg: Secondary | ICD-10-CM | POA: Diagnosis not present

## 2018-06-24 DIAGNOSIS — M545 Low back pain: Secondary | ICD-10-CM | POA: Diagnosis not present

## 2018-06-27 DIAGNOSIS — M5431 Sciatica, right side: Secondary | ICD-10-CM | POA: Diagnosis not present

## 2018-06-27 DIAGNOSIS — M545 Low back pain: Secondary | ICD-10-CM | POA: Diagnosis not present

## 2018-06-27 DIAGNOSIS — M25551 Pain in right hip: Secondary | ICD-10-CM | POA: Diagnosis not present

## 2018-06-27 DIAGNOSIS — M79604 Pain in right leg: Secondary | ICD-10-CM | POA: Diagnosis not present

## 2018-06-28 ENCOUNTER — Other Ambulatory Visit: Payer: Self-pay | Admitting: Family Medicine

## 2018-06-30 DIAGNOSIS — M545 Low back pain: Secondary | ICD-10-CM | POA: Diagnosis not present

## 2018-06-30 DIAGNOSIS — M25551 Pain in right hip: Secondary | ICD-10-CM | POA: Diagnosis not present

## 2018-06-30 DIAGNOSIS — M79604 Pain in right leg: Secondary | ICD-10-CM | POA: Diagnosis not present

## 2018-06-30 DIAGNOSIS — M5431 Sciatica, right side: Secondary | ICD-10-CM | POA: Diagnosis not present

## 2018-07-02 ENCOUNTER — Encounter: Payer: Self-pay | Admitting: Sports Medicine

## 2018-07-05 DIAGNOSIS — M5431 Sciatica, right side: Secondary | ICD-10-CM | POA: Diagnosis not present

## 2018-07-05 DIAGNOSIS — M545 Low back pain: Secondary | ICD-10-CM | POA: Diagnosis not present

## 2018-07-05 DIAGNOSIS — M79604 Pain in right leg: Secondary | ICD-10-CM | POA: Diagnosis not present

## 2018-07-05 DIAGNOSIS — M25551 Pain in right hip: Secondary | ICD-10-CM | POA: Diagnosis not present

## 2018-07-07 DIAGNOSIS — M545 Low back pain: Secondary | ICD-10-CM | POA: Diagnosis not present

## 2018-07-07 DIAGNOSIS — M25551 Pain in right hip: Secondary | ICD-10-CM | POA: Diagnosis not present

## 2018-07-07 DIAGNOSIS — M79604 Pain in right leg: Secondary | ICD-10-CM | POA: Diagnosis not present

## 2018-07-07 DIAGNOSIS — M5431 Sciatica, right side: Secondary | ICD-10-CM | POA: Diagnosis not present

## 2018-07-09 ENCOUNTER — Other Ambulatory Visit: Payer: Self-pay | Admitting: Family Medicine

## 2018-07-12 DIAGNOSIS — M545 Low back pain: Secondary | ICD-10-CM | POA: Diagnosis not present

## 2018-07-12 DIAGNOSIS — M25551 Pain in right hip: Secondary | ICD-10-CM | POA: Diagnosis not present

## 2018-07-12 DIAGNOSIS — M79604 Pain in right leg: Secondary | ICD-10-CM | POA: Diagnosis not present

## 2018-07-12 DIAGNOSIS — M5431 Sciatica, right side: Secondary | ICD-10-CM | POA: Diagnosis not present

## 2018-07-15 ENCOUNTER — Other Ambulatory Visit: Payer: Self-pay

## 2018-07-15 ENCOUNTER — Encounter: Payer: Self-pay | Admitting: Family Medicine

## 2018-07-15 ENCOUNTER — Ambulatory Visit (INDEPENDENT_AMBULATORY_CARE_PROVIDER_SITE_OTHER): Payer: Medicare Other | Admitting: Family Medicine

## 2018-07-15 VITALS — BP 132/88 | HR 79 | Temp 98.0°F | Resp 16 | Ht 62.0 in | Wt 200.8 lb

## 2018-07-15 DIAGNOSIS — R300 Dysuria: Secondary | ICD-10-CM | POA: Diagnosis not present

## 2018-07-15 LAB — POCT URINALYSIS DIPSTICK
BILIRUBIN UA: NEGATIVE
Glucose, UA: NEGATIVE
Ketones, UA: NEGATIVE
LEUKOCYTES UA: NEGATIVE
Nitrite, UA: NEGATIVE
PH UA: 7 (ref 5.0–8.0)
Protein, UA: NEGATIVE
RBC UA: NEGATIVE
SPEC GRAV UA: 1.01 (ref 1.010–1.025)
UROBILINOGEN UA: 0.2 U/dL

## 2018-07-15 MED ORDER — NITROFURANTOIN MONOHYD MACRO 100 MG PO CAPS: 100.0000 mg | ORAL_CAPSULE | Freq: Two times a day (BID) | ORAL | 0 refills | Status: DC

## 2018-07-15 NOTE — Patient Instructions (Signed)
Please follow up if symptoms do not improve or as needed.   Please increase your fluid intake: water and cranberry juice would be good choices. Try to make the urine light in color. I'm hoping this will help your symptoms; if it doesn't, then you may start the antibiotics tomorrow.   We will let you know what your urine culture shows next week.

## 2018-07-15 NOTE — Progress Notes (Signed)
Subjective   CC:  Chief Complaint  Patient presents with  . Urinary Tract Infection    started last night    HPI: Lynn [name] is a 68 y.o. female who presents to the office today to address the problems listed above in the chief complaint.  Patient reports dysuria and urinary frequency.  She has sensation of increased urinary pressure.  She denies fevers flank pain nausea vomiting or gross hematuria.  Symptoms have been present for since late last night (<24 hours).  She denies history of interstitial cystitis or OAB.  She denies vaginal symptoms including vaginal discharge or pelvic pain. She recently completed a 10 day course of augmentin for a sinus infection. That has improved.   Assessment  1. Dysuria      Plan   Dysuria with nl but dark urine. Will culture urine to see if infection present. Could be early in course so printed RX of macrobid. Pt may start tomorrow if sxs persist after hydration. Patient understands and agrees with care plan.  No red flags identified.   Follow up: No follow-ups on file.  Orders Placed This Encounter  Procedures  . Urine Culture  . POCT urinalysis dipstick   Meds ordered this encounter  Medications  . nitrofurantoin, macrocrystal-monohydrate, (MACROBID) 100 MG capsule    Sig: Take 1 capsule (100 mg total) by mouth 2 (two) times daily.    Dispense:  10 capsule    Refill:  0      I reviewed the patients updated PMH, FH, and SocHx.    Patient Active Problem List   Diagnosis Date Noted  . Pain of right hip joint 05/21/2018  . Primary osteoarthritis of left knee 10/18/2017  . Physical exam 04/22/2016  . Hypothyroidism 10/24/2015  . Hyperlipidemia 10/24/2015  . Obesity (BMI 35.0-39.9 without comorbidity) 10/24/2015   Current Meds  Medication Sig  . atorvastatin (LIPITOR) 40 MG tablet TAKE 1 TABLET BY MOUTH EVERY DAY  . diclofenac sodium (VOLTAREN) 1 % GEL Apply topically to affected area qid  . fluticasone (FLONASE) 50 MCG/ACT  nasal spray USE 2 SPRAYS IN EACH NOSTRIL ONCE A DAY  . furosemide (LASIX) 40 MG tablet Take 1 tablet (40 mg total) by mouth daily.  Marland Kitchen gabapentin (NEURONTIN) 300 MG capsule PLEASE SEE ATTACHED FOR DETAILED DIRECTIONS  . levothyroxine (SYNTHROID, LEVOTHROID) 100 MCG tablet TAKE 1 TABLET BY MOUTH EVERY DAY  . MELATONIN PO Take 1 tablet by mouth at bedtime as needed.  . meloxicam (MOBIC) 15 MG tablet TAKE 1 TABLET BY MOUTH EVERY DAY  . Multiple Vitamin (MULTIVITAMIN) tablet Take 1 tablet by mouth daily.  . Probiotic Product (PROBIOTIC-10) CHEW Chew by mouth.  . promethazine (PHENERGAN) 25 MG tablet Take 25 mg by mouth every 6 (six) hours as needed for nausea or vomiting.    Review of Systems: Cardiovascular: negative for chest pain Respiratory: negative for SOB or persistent cough Gastrointestinal: negative for abdominal pain Constitutional: Negative for fever malaise or anorexia  Objective  Vitals: BP 132/88   Pulse 79   Temp 98 F (36.7 C) (Oral)   Resp 16   Ht 5\' 2"  (1.575 m)   Wt 200 lb 12.8 oz (91.1 kg)   SpO2 97%   BMI 36.73 kg/m  General: no acute distress  Psych:  Alert and oriented, normal mood and affect Cardiovascular:  RRR without murmur or gallop. no peripheral edema Respiratory:  Good breath sounds bilaterally, CTAB with normal respiratory effort Gastrointestinal: soft, flat abdomen,  normal active bowel sounds, no palpable masses, no hepatosplenomegaly, no appreciated hernias, NO CVAT, mild suprapubic ttp w/o rebound or guarding Skin:  Warm, no rashes Neurologic:   Mental status is normal. normal gait Office Visit on 07/15/2018  Component Date Value Ref Range Status  . Color, UA 07/15/2018 Dark Yellow   Final  . Glucose, UA 07/15/2018 Negative  Negative Final  . Bilirubin, UA 07/15/2018 Negative   Final  . Ketones, UA 07/15/2018 Negative   Final  . Spec Grav, UA 07/15/2018 1.010  1.010 - 1.025 Final  . Blood, UA 07/15/2018 Negative   Final  . pH, UA 07/15/2018 7.0   5.0 - 8.0 Final  . Protein, UA 07/15/2018 Negative  Negative Final  . Urobilinogen, UA 07/15/2018 0.2  0.2 or 1.0 E.U./dL Final  . Nitrite, UA 07/15/2018 Negative   Final  . Leukocytes, UA 07/15/2018 Negative  Negative Final    Commons side effects, risks, benefits, and alternatives for medications and treatment plan prescribed today were discussed, and the patient expressed understanding of the given instructions. Patient is instructed to call or message via MyChart if he/she has any questions or concerns regarding our treatment plan. No barriers to understanding were identified. We discussed Red Flag symptoms and signs in detail. Patient expressed understanding regarding what to do in case of urgent or emergency type symptoms.   Medication list was reconciled, printed and provided to the patient in AVS. Patient instructions and summary information was reviewed with the patient as documented in the AVS. This note was prepared with assistance of Dragon voice recognition software. Occasional wrong-word or sound-a-like substitutions may have occurred due to the inherent limitations of voice recognition software

## 2018-07-16 LAB — URINE CULTURE
MICRO NUMBER: 168142
SPECIMEN QUALITY: ADEQUATE

## 2018-07-18 ENCOUNTER — Ambulatory Visit (INDEPENDENT_AMBULATORY_CARE_PROVIDER_SITE_OTHER): Payer: Medicare Other | Admitting: Sports Medicine

## 2018-07-18 ENCOUNTER — Encounter: Payer: Self-pay | Admitting: Sports Medicine

## 2018-07-18 ENCOUNTER — Ambulatory Visit: Payer: Self-pay

## 2018-07-18 VITALS — BP 140/92 | HR 88 | Ht 62.0 in | Wt 201.4 lb

## 2018-07-18 DIAGNOSIS — M25462 Effusion, left knee: Secondary | ICD-10-CM

## 2018-07-18 DIAGNOSIS — M1712 Unilateral primary osteoarthritis, left knee: Secondary | ICD-10-CM

## 2018-07-18 NOTE — Progress Notes (Signed)
  Lynn [name]. Meleny [place], [place] [facility] [place] - 68 y.o. female MRN [id number]  Date of birth: [birth year]  Visit Date: July 18, 2018  PCP: [name], MD   Referred by: [name], MD  SUBJECTIVE:  Chief Complaint  Patient presents with  . Follow-up    L knee pain.  Zilretta injections x 2 (01/06/18 and 04/19/18).  Gabapentin 300 mg, Meloxicam and Voltaren gel.  HEP for hip and back.    HPI: Patient is here for ongoing left knee pain.  She reports doing well up until approximately 2 weeks ago.  Symptoms almost immediately returned.  She has continued to perform her home therapeutic exercises.  Until her symptoms return she had a most complete resolution of her day-to-day discomfort.  REVIEW OF SYSTEMS: No significant nighttime awakenings due to this issue. Denies fevers, chills, recent weight gain or weight loss.  No night sweats.  Pt denies any change in bowel or bladder habits, muscle weakness, numbness or falls associated with this pain.  HISTORY:  Prior history reviewed and updated per electronic medical record.  Patient Active Problem List   Diagnosis Date Noted  . Pain of right hip joint 05/21/2018  . Primary osteoarthritis of left knee 10/18/2017    Zilretta injection 07/18/2018, 04/19/2018, 01/06/2018.  Corticosteroid injection 10/18/2017.    Marland Kitchen Physical exam 04/22/2016  . Hypothyroidism 10/24/2015  . Hyperlipidemia 10/24/2015  . Obesity (BMI 35.0-39.9 without comorbidity) 10/24/2015   Social History   Occupational History  . Not on file  Tobacco Use  . Smoking status: Never Smoker  . Smokeless tobacco: Never Used  Substance and Sexual Activity  . Alcohol use: No  . Drug use: No  . Sexual activity: Not on file   Social History   Social History Narrative  . Not on file    OBJECTIVE:  VS:  HT:5\' 2"  (157.5 cm)   WT:201 lb 6.4 oz (91.4 kg)  BMI:36.83    BP:(!) 140/92   HR:88bpm  TEMP: ( )  RESP:92 %   PHYSICAL EXAM: Adult female. No acute distress.  Alert and appropriate. Left knee is overall well aligned.  She has moderate effusion.  Generalized tenderness to palpation.   ASSESSMENT:  1. Primary osteoarthritis of left knee   2. Effusion of left knee     PROCEDURES:  US Guided Injection per procedure note       PLAN:  Pertinent additional documentation may be included in corresponding procedure notes, imaging studies, problem based documentation and patient instructions.  No problem-specific Assessment & Plan notes found for this encounter.   She has done well with the Zilretta injections previously.  Continue previously prescribed home exercise program.   Activity modifications and the importance of avoiding exacerbating activities (limiting pain to no more than a 4 / 10 during or following activity) recommended and discussed.  Discussed red flag symptoms that warrant earlier emergent evaluation and patient voices understanding.  Return in about 13 weeks (around 10/17/2018).  Can consider repeat Zilretta at that time         Gerda Diss, [facility] Physician

## 2018-07-18 NOTE — Progress Notes (Signed)
Please call patient: I have reviewed his/her lab results. Her urine culture was negative. If she started the antibiotics, she may stop them. F/u with PCP if symptoms persist.

## 2018-07-18 NOTE — Patient Instructions (Signed)

## 2018-07-18 NOTE — Procedures (Signed)
PROCEDURE NOTE:  Ultrasound Guided: Aspiration and Injection: Left knee Images were obtained and interpreted by myself, Teresa Coombs, DO  Images have been saved and stored to PACS system. Images obtained on: GE S7 Ultrasound machine    ULTRASOUND FINDINGS:  Large effusion, generalized synovitis.  Tricompartmental degenerative changes  DESCRIPTION OF PROCEDURE:  The patient's clinical condition is marked by substantial pain and/or significant functional disability. Other conservative therapy has not provided relief, is contraindicated, or not appropriate. There is a reasonable likelihood that injection will significantly improve the patient's pain and/or functional impairment.   After discussing the risks, benefits and expected outcomes of the injection and all questions were reviewed and answered, the patient wished to undergo the above named procedure.  Verbal consent was obtained.  The ultrasound was used to identify the target structure and adjacent neurovascular structures. The skin was then prepped in sterile fashion and the target structure was injected under direct visualization using sterile technique as below:  Single injection performed as below: PREP: Alcohol, Ethel Chloride and 5 cc 1% lidocaine on 25g 1.5 in. needle APPROACH:superiolateral, stopcock technique, 18g 1.5 in. INJECTATE: 5cc Zilretta (32mg /58mL Sustained Release Triamcinolone) ASPIRATE: 9mL  and clear and straw colored  DRESSING: Band-Aid  Post procedural instructions including recommending icing and warning signs for infection were reviewed.    This procedure was well tolerated and there were no complications.   IMPRESSION: Succesful Ultrasound Guided: Aspiration and Injection

## 2018-07-21 DIAGNOSIS — M25551 Pain in right hip: Secondary | ICD-10-CM | POA: Diagnosis not present

## 2018-07-21 DIAGNOSIS — M545 Low back pain: Secondary | ICD-10-CM | POA: Diagnosis not present

## 2018-07-21 DIAGNOSIS — M79604 Pain in right leg: Secondary | ICD-10-CM | POA: Diagnosis not present

## 2018-07-21 DIAGNOSIS — M5431 Sciatica, right side: Secondary | ICD-10-CM | POA: Diagnosis not present

## 2018-07-25 ENCOUNTER — Encounter: Payer: Self-pay | Admitting: Physician Assistant

## 2018-07-25 ENCOUNTER — Ambulatory Visit (INDEPENDENT_AMBULATORY_CARE_PROVIDER_SITE_OTHER): Payer: Medicare Other | Admitting: Physician Assistant

## 2018-07-25 ENCOUNTER — Other Ambulatory Visit: Payer: Self-pay

## 2018-07-25 VITALS — BP 138/98 | HR 75 | Temp 97.8°F | Resp 16 | Ht 62.0 in | Wt 200.0 lb

## 2018-07-25 DIAGNOSIS — S0081XA Abrasion of other part of head, initial encounter: Secondary | ICD-10-CM | POA: Diagnosis not present

## 2018-07-25 DIAGNOSIS — Z23 Encounter for immunization: Secondary | ICD-10-CM

## 2018-07-25 DIAGNOSIS — W5503XA Scratched by cat, initial encounter: Secondary | ICD-10-CM

## 2018-07-25 MED ORDER — AMOXICILLIN-POT CLAVULANATE 875-125 MG PO TABS: 1.0000 | ORAL_TABLET | Freq: Two times a day (BID) | ORAL | 0 refills | Status: DC

## 2018-07-25 NOTE — Addendum Note (Signed)
Addended by: Davis Gourd on: 07/25/2018 01:51 PM   Modules accepted: Orders

## 2018-07-25 NOTE — Progress Notes (Signed)
Patient presents to clinic today c/o cat scratch to her left lower face this morning. States she was holding her cat to put him in a carrier to [facility]. She lost grp on the cat and he tried to grab on to prevent falling, scratching her face in the process. Pet is up-to-date on immunizations. Immediately washed and dried the area, applying neosporin. Denies fever, chills, nausea or vomiting.   Past Medical History:  Diagnosis Date  . Hyperlipidemia   . Hypothyroidism     Current Outpatient Medications on File Prior to Visit  Medication Sig Dispense Refill  . atorvastatin (LIPITOR) 40 MG tablet TAKE 1 TABLET BY MOUTH EVERY DAY 90 tablet 1  . diclofenac sodium (VOLTAREN) 1 % GEL Apply topically to affected area qid 112 g 2  . fluticasone (FLONASE) 50 MCG/ACT nasal spray USE 2 SPRAYS IN EACH NOSTRIL ONCE A DAY 16 g 3  . furosemide (LASIX) 40 MG tablet Take 1 tablet (40 mg total) by mouth daily. 30 tablet 3  . gabapentin (NEURONTIN) 300 MG capsule PLEASE SEE ATTACHED FOR DETAILED DIRECTIONS 270 capsule 1  . levothyroxine (SYNTHROID, LEVOTHROID) 100 MCG tablet TAKE 1 TABLET BY MOUTH EVERY DAY 90 tablet 1  . MELATONIN PO Take 1 tablet by mouth at bedtime as needed.    . meloxicam (MOBIC) 15 MG tablet TAKE 1 TABLET BY MOUTH EVERY DAY 30 tablet 0  . Multiple Vitamin (MULTIVITAMIN) tablet Take 1 tablet by mouth daily.    . nitrofurantoin, macrocrystal-monohydrate, (MACROBID) 100 MG capsule Take 1 capsule (100 mg total) by mouth 2 (two) times daily. 10 capsule 0  . Probiotic Product (PROBIOTIC-10) CHEW Chew by mouth.    . promethazine (PHENERGAN) 25 MG tablet Take 25 mg by mouth every 6 (six) hours as needed for nausea or vomiting.     No current facility-administered medications on file prior to visit.     Allergies  Allergen Reactions  . Sulfa Antibiotics Nausea Only  . Brimonidine Tartrate     Family History  Problem Relation Age of Onset  . Colon cancer Father   . Cancer Father         skin  . Cancer Paternal Grandfather        skin  . Pancreatic cancer Neg Hx   . Stomach cancer Neg Hx     Social History   Socioeconomic History  . Marital status: Widowed    Spouse name: Not on file  . Number of children: Not on file  . Years of education: Not on file  . Highest education level: Not on file  Occupational History  . Not on file  Social Needs  . Financial resource strain: Not on file  . Food insecurity:    Worry: Not on file    Inability: Not on file  . Transportation needs:    Medical: Not on file    Non-medical: Not on file  Tobacco Use  . Smoking status: Never Smoker  . Smokeless tobacco: Never Used  Substance and Sexual Activity  . Alcohol use: No  . Drug use: No  . Sexual activity: Not on file  Lifestyle  . Physical activity:    Days per week: Not on file    Minutes per session: Not on file  . Stress: Not on file  Relationships  . Social connections:    Talks on phone: Not on file    Gets together: Not on file    Attends religious service:  Not on file    Active member of club or organization: Not on file    Attends meetings of clubs or organizations: Not on file    Relationship status: Not on file  Other Topics Concern  . Not on file  Social History Narrative  . Not on file   Review of Systems - See HPI.  All other ROS are negative.  There were no vitals taken for this visit.  Physical Exam Vitals signs reviewed.  Constitutional:      Appearance: Normal appearance.  HENT:     Head: Normocephalic and atraumatic.  Neck:     Musculoskeletal: Neck supple.  Cardiovascular:     Rate and Rhythm: Normal rate and regular rhythm.     Heart sounds: Normal heart sounds.  Pulmonary:     Effort: Pulmonary effort is normal.  Skin:      Neurological:     Mental Status: She is alert.     Recent Results (from the past 2160 hour(s))  Lipid panel     Status: Abnormal   Collection Time: 04/27/18  9:46 AM  Result Value Ref Range    Cholesterol 200 0 - 200 mg/dL    Comment: ATP III Classification       Desirable:  < 200 mg/dL               Borderline High:  200 - 239 mg/dL          High:  > = 240 mg/dL   Triglycerides 123.0 0.0 - 149.0 mg/dL    Comment: Normal:  <150 mg/dLBorderline High:  150 - 199 mg/dL   HDL 53.80 >39.00 mg/dL   VLDL 24.6 0.0 - 40.0 mg/dL   LDL Cholesterol 122 (H) 0 - 99 mg/dL   Total CHOL/HDL Ratio 4     Comment:                Men          Women1/2 Average Risk     3.4          3.3Average Risk          5.0          4.42X Average Risk          9.6          7.13X Average Risk          15.0          11.0                       NonHDL 146.12     Comment: NOTE:  Non-HDL goal should be 30 mg/dL higher than patient's LDL goal (i.e. LDL goal of < 70 mg/dL, would have non-HDL goal of < 100 mg/dL)  Basic metabolic panel     Status: Abnormal   Collection Time: 04/27/18  9:46 AM  Result Value Ref Range   Sodium 142 135 - 145 mEq/L   Potassium 4.2 3.5 - 5.1 mEq/L   Chloride 104 96 - 112 mEq/L   CO2 31 19 - 32 mEq/L   Glucose, Bld 89 70 - 99 mg/dL   BUN 17 6 - 23 mg/dL   Creatinine, Ser 1.03 0.40 - 1.20 mg/dL   Calcium 9.4 8.4 - 10.5 mg/dL   GFR 56.67 (L) >60.00 mL/min  TSH     Status: None   Collection Time: 04/27/18  9:46 AM  Result Value Ref Range   TSH  3.03 0.35 - 4.50 uIU/mL  Hepatic function panel     Status: None   Collection Time: 04/27/18  9:46 AM  Result Value Ref Range   Total Bilirubin 0.8 0.2 - 1.2 mg/dL   Bilirubin, Direct 0.1 0.0 - 0.3 mg/dL   Alkaline Phosphatase 73 39 - 117 U/L   AST 14 0 - 37 U/L   ALT 12 0 - 35 U/L   Total Protein 6.6 6.0 - 8.3 g/dL   Albumin 4.1 3.5 - 5.2 g/dL  CBC with Differential/Platelet     Status: Abnormal   Collection Time: 04/27/18  9:46 AM  Result Value Ref Range   WBC 7.1 4.0 - 10.5 K/uL   RBC 5.18 (H) 3.87 - 5.11 Mil/uL   Hemoglobin 15.0 12.0 - 15.0 g/dL   HCT 44.6 36.0 - 46.0 %   MCV 86.0 78.0 - 100.0 fl   MCHC 33.7 30.0 - 36.0 g/dL   RDW 13.0  11.5 - 15.5 %   Platelets 238.0 150.0 - 400.0 K/uL   Neutrophils Relative % 64.2 43.0 - 77.0 %   Lymphocytes Relative 27.1 12.0 - 46.0 %   Monocytes Relative 5.9 3.0 - 12.0 %   Eosinophils Relative 1.5 0.0 - 5.0 %   Basophils Relative 1.3 0.0 - 3.0 %   Neutro Abs 4.6 1.4 - 7.7 K/uL   Lymphs Abs 1.9 0.7 - 4.0 K/uL   Monocytes Absolute 0.4 0.1 - 1.0 K/uL   Eosinophils Absolute 0.1 0.0 - 0.7 K/uL   Basophils Absolute 0.1 0.0 - 0.1 K/uL  POCT urinalysis dipstick     Status: None   Collection Time: 07/15/18  4:23 PM  Result Value Ref Range   Color, UA Dark Yellow    Clarity, UA     Glucose, UA Negative Negative   Bilirubin, UA Negative    Ketones, UA Negative    Spec Grav, UA 1.010 1.010 - 1.025   Blood, UA Negative    pH, UA 7.0 5.0 - 8.0   Protein, UA Negative Negative   Urobilinogen, UA 0.2 0.2 or 1.0 E.U./dL   Nitrite, UA Negative    Leukocytes, UA Negative Negative   Appearance     Odor    Urine Culture     Status: None   Collection Time: 07/15/18  4:27 PM  Result Value Ref Range   MICRO NUMBER: [id number]    SPECIMEN QUALITY: Adequate    Sample Source URINE    STATUS: FINAL    Result:      Single organism less than 10,000 CFU/mL isolated. These organisms, commonly found on external and internal genitalia, are considered colonizers. No further testing performed.    Assessment/Plan: 1. Cat scratch Already cleared well. Re-cleaned with topical triple antibiotic ointment applied. Rx Augmentin. Return precautions reviewed. Tetanus updated today.  - amoxicillin-clavulanate (AUGMENTIN) 875-125 MG tablet; Take 1 tablet by mouth 2 (two) times daily.  Dispense: 14 tablet; Refill: 0   Leeanne Rio, PA-C

## 2018-07-25 NOTE — Patient Instructions (Signed)
Keep skin clean and dry, washing with warm, soapy water then patting well.  Avoid touching the area. Start the Augmentin as directed.   Call me if you note any increased redness, warmth or tenderness to the area.

## 2018-07-26 DIAGNOSIS — M79604 Pain in right leg: Secondary | ICD-10-CM | POA: Diagnosis not present

## 2018-07-26 DIAGNOSIS — M545 Low back pain: Secondary | ICD-10-CM | POA: Diagnosis not present

## 2018-07-26 DIAGNOSIS — H43813 Vitreous degeneration, bilateral: Secondary | ICD-10-CM | POA: Diagnosis not present

## 2018-07-26 DIAGNOSIS — M25551 Pain in right hip: Secondary | ICD-10-CM | POA: Diagnosis not present

## 2018-07-26 DIAGNOSIS — M5431 Sciatica, right side: Secondary | ICD-10-CM | POA: Diagnosis not present

## 2018-07-28 DIAGNOSIS — M5431 Sciatica, right side: Secondary | ICD-10-CM | POA: Diagnosis not present

## 2018-07-28 DIAGNOSIS — M79604 Pain in right leg: Secondary | ICD-10-CM | POA: Diagnosis not present

## 2018-07-28 DIAGNOSIS — M545 Low back pain: Secondary | ICD-10-CM | POA: Diagnosis not present

## 2018-07-28 DIAGNOSIS — M25551 Pain in right hip: Secondary | ICD-10-CM | POA: Diagnosis not present

## 2018-07-31 ENCOUNTER — Other Ambulatory Visit: Payer: Self-pay | Admitting: Family Medicine

## 2018-08-02 DIAGNOSIS — M79604 Pain in right leg: Secondary | ICD-10-CM | POA: Diagnosis not present

## 2018-08-02 DIAGNOSIS — M5431 Sciatica, right side: Secondary | ICD-10-CM | POA: Diagnosis not present

## 2018-08-02 DIAGNOSIS — M545 Low back pain: Secondary | ICD-10-CM | POA: Diagnosis not present

## 2018-08-02 DIAGNOSIS — M25551 Pain in right hip: Secondary | ICD-10-CM | POA: Diagnosis not present

## 2018-08-05 DIAGNOSIS — M5431 Sciatica, right side: Secondary | ICD-10-CM | POA: Diagnosis not present

## 2018-08-05 DIAGNOSIS — M545 Low back pain: Secondary | ICD-10-CM | POA: Diagnosis not present

## 2018-08-05 DIAGNOSIS — M25551 Pain in right hip: Secondary | ICD-10-CM | POA: Diagnosis not present

## 2018-08-05 DIAGNOSIS — M79604 Pain in right leg: Secondary | ICD-10-CM | POA: Diagnosis not present

## 2018-08-09 ENCOUNTER — Telehealth: Payer: Self-pay | Admitting: *Deleted

## 2018-08-09 DIAGNOSIS — M545 Low back pain: Secondary | ICD-10-CM | POA: Diagnosis not present

## 2018-08-09 DIAGNOSIS — M5431 Sciatica, right side: Secondary | ICD-10-CM | POA: Diagnosis not present

## 2018-08-09 DIAGNOSIS — M79604 Pain in right leg: Secondary | ICD-10-CM | POA: Diagnosis not present

## 2018-08-09 DIAGNOSIS — M25551 Pain in right hip: Secondary | ICD-10-CM | POA: Diagnosis not present

## 2018-08-09 MED ORDER — FLUCONAZOLE 150 MG PO TABS: ORAL_TABLET | ORAL | 0 refills | Status: DC

## 2018-08-09 NOTE — Telephone Encounter (Signed)
Please advise  Copied from [place] [id number]. Topic: General - Other >> Aug 09, 2018 12:27 PM Carolyn Stare wrote:  Pt call to say she develop a yeast infection and has been treating it over the counter and it is not helping and is asking if something can be called in for her    [facility]

## 2018-08-09 NOTE — Telephone Encounter (Signed)
Diflucan ordered. Take one pill today.  OV needed if not improved in next 3-5 days for evaluation. Thanks.

## 2018-08-09 NOTE — Telephone Encounter (Signed)
Pt aware.

## 2018-08-16 DIAGNOSIS — M25551 Pain in right hip: Secondary | ICD-10-CM | POA: Diagnosis not present

## 2018-08-16 DIAGNOSIS — M545 Low back pain: Secondary | ICD-10-CM | POA: Diagnosis not present

## 2018-08-16 DIAGNOSIS — M79604 Pain in right leg: Secondary | ICD-10-CM | POA: Diagnosis not present

## 2018-08-16 DIAGNOSIS — M5431 Sciatica, right side: Secondary | ICD-10-CM | POA: Diagnosis not present

## 2018-08-18 DIAGNOSIS — M79604 Pain in right leg: Secondary | ICD-10-CM | POA: Diagnosis not present

## 2018-08-18 DIAGNOSIS — M545 Low back pain: Secondary | ICD-10-CM | POA: Diagnosis not present

## 2018-08-18 DIAGNOSIS — M5431 Sciatica, right side: Secondary | ICD-10-CM | POA: Diagnosis not present

## 2018-08-18 DIAGNOSIS — M25551 Pain in right hip: Secondary | ICD-10-CM | POA: Diagnosis not present

## 2018-08-23 DIAGNOSIS — M5431 Sciatica, right side: Secondary | ICD-10-CM | POA: Diagnosis not present

## 2018-08-23 DIAGNOSIS — M25551 Pain in right hip: Secondary | ICD-10-CM | POA: Diagnosis not present

## 2018-08-23 DIAGNOSIS — M79604 Pain in right leg: Secondary | ICD-10-CM | POA: Diagnosis not present

## 2018-08-23 DIAGNOSIS — M545 Low back pain: Secondary | ICD-10-CM | POA: Diagnosis not present

## 2018-08-25 DIAGNOSIS — M79604 Pain in right leg: Secondary | ICD-10-CM | POA: Diagnosis not present

## 2018-08-25 DIAGNOSIS — M25551 Pain in right hip: Secondary | ICD-10-CM | POA: Diagnosis not present

## 2018-08-25 DIAGNOSIS — M5431 Sciatica, right side: Secondary | ICD-10-CM | POA: Diagnosis not present

## 2018-08-25 DIAGNOSIS — M545 Low back pain: Secondary | ICD-10-CM | POA: Diagnosis not present

## 2018-08-30 DIAGNOSIS — M5431 Sciatica, right side: Secondary | ICD-10-CM | POA: Diagnosis not present

## 2018-08-30 DIAGNOSIS — M545 Low back pain: Secondary | ICD-10-CM | POA: Diagnosis not present

## 2018-08-30 DIAGNOSIS — M25551 Pain in right hip: Secondary | ICD-10-CM | POA: Diagnosis not present

## 2018-08-30 DIAGNOSIS — M79604 Pain in right leg: Secondary | ICD-10-CM | POA: Diagnosis not present

## 2018-09-01 DIAGNOSIS — M25551 Pain in right hip: Secondary | ICD-10-CM | POA: Diagnosis not present

## 2018-09-01 DIAGNOSIS — M79604 Pain in right leg: Secondary | ICD-10-CM | POA: Diagnosis not present

## 2018-09-01 DIAGNOSIS — M5431 Sciatica, right side: Secondary | ICD-10-CM | POA: Diagnosis not present

## 2018-09-01 DIAGNOSIS — M545 Low back pain: Secondary | ICD-10-CM | POA: Diagnosis not present

## 2018-09-06 DIAGNOSIS — M79604 Pain in right leg: Secondary | ICD-10-CM | POA: Diagnosis not present

## 2018-09-06 DIAGNOSIS — M25551 Pain in right hip: Secondary | ICD-10-CM | POA: Diagnosis not present

## 2018-09-06 DIAGNOSIS — M545 Low back pain: Secondary | ICD-10-CM | POA: Diagnosis not present

## 2018-09-06 DIAGNOSIS — M5431 Sciatica, right side: Secondary | ICD-10-CM | POA: Diagnosis not present

## 2018-09-08 DIAGNOSIS — M5431 Sciatica, right side: Secondary | ICD-10-CM | POA: Diagnosis not present

## 2018-09-08 DIAGNOSIS — M79604 Pain in right leg: Secondary | ICD-10-CM | POA: Diagnosis not present

## 2018-09-08 DIAGNOSIS — M545 Low back pain: Secondary | ICD-10-CM | POA: Diagnosis not present

## 2018-09-08 DIAGNOSIS — M25551 Pain in right hip: Secondary | ICD-10-CM | POA: Diagnosis not present

## 2018-09-13 DIAGNOSIS — M545 Low back pain: Secondary | ICD-10-CM | POA: Diagnosis not present

## 2018-09-13 DIAGNOSIS — M79604 Pain in right leg: Secondary | ICD-10-CM | POA: Diagnosis not present

## 2018-09-13 DIAGNOSIS — M5431 Sciatica, right side: Secondary | ICD-10-CM | POA: Diagnosis not present

## 2018-09-13 DIAGNOSIS — M25551 Pain in right hip: Secondary | ICD-10-CM | POA: Diagnosis not present

## 2018-09-15 DIAGNOSIS — M25551 Pain in right hip: Secondary | ICD-10-CM | POA: Diagnosis not present

## 2018-09-15 DIAGNOSIS — M5431 Sciatica, right side: Secondary | ICD-10-CM | POA: Diagnosis not present

## 2018-09-15 DIAGNOSIS — M79604 Pain in right leg: Secondary | ICD-10-CM | POA: Diagnosis not present

## 2018-09-15 DIAGNOSIS — M545 Low back pain: Secondary | ICD-10-CM | POA: Diagnosis not present

## 2018-09-20 DIAGNOSIS — M25551 Pain in right hip: Secondary | ICD-10-CM | POA: Diagnosis not present

## 2018-09-20 DIAGNOSIS — M545 Low back pain: Secondary | ICD-10-CM | POA: Diagnosis not present

## 2018-09-20 DIAGNOSIS — M5431 Sciatica, right side: Secondary | ICD-10-CM | POA: Diagnosis not present

## 2018-09-20 DIAGNOSIS — M79604 Pain in right leg: Secondary | ICD-10-CM | POA: Diagnosis not present

## 2018-09-22 DIAGNOSIS — M25551 Pain in right hip: Secondary | ICD-10-CM | POA: Diagnosis not present

## 2018-09-22 DIAGNOSIS — M79604 Pain in right leg: Secondary | ICD-10-CM | POA: Diagnosis not present

## 2018-09-22 DIAGNOSIS — M545 Low back pain: Secondary | ICD-10-CM | POA: Diagnosis not present

## 2018-09-22 DIAGNOSIS — M5431 Sciatica, right side: Secondary | ICD-10-CM | POA: Diagnosis not present

## 2018-09-29 DIAGNOSIS — M5431 Sciatica, right side: Secondary | ICD-10-CM | POA: Diagnosis not present

## 2018-09-29 DIAGNOSIS — M545 Low back pain: Secondary | ICD-10-CM | POA: Diagnosis not present

## 2018-09-29 DIAGNOSIS — M25551 Pain in right hip: Secondary | ICD-10-CM | POA: Diagnosis not present

## 2018-09-29 DIAGNOSIS — M79604 Pain in right leg: Secondary | ICD-10-CM | POA: Diagnosis not present

## 2018-10-06 DIAGNOSIS — M79604 Pain in right leg: Secondary | ICD-10-CM | POA: Diagnosis not present

## 2018-10-06 DIAGNOSIS — M5431 Sciatica, right side: Secondary | ICD-10-CM | POA: Diagnosis not present

## 2018-10-06 DIAGNOSIS — M25551 Pain in right hip: Secondary | ICD-10-CM | POA: Diagnosis not present

## 2018-10-06 DIAGNOSIS — M545 Low back pain: Secondary | ICD-10-CM | POA: Diagnosis not present

## 2018-10-10 ENCOUNTER — Other Ambulatory Visit: Payer: Self-pay | Admitting: Family Medicine

## 2018-10-10 DIAGNOSIS — Z1231 Encounter for screening mammogram for malignant neoplasm of breast: Secondary | ICD-10-CM

## 2018-10-11 ENCOUNTER — Encounter: Payer: Self-pay | Admitting: Gastroenterology

## 2018-10-11 DIAGNOSIS — M25551 Pain in right hip: Secondary | ICD-10-CM | POA: Diagnosis not present

## 2018-10-11 DIAGNOSIS — M5431 Sciatica, right side: Secondary | ICD-10-CM | POA: Diagnosis not present

## 2018-10-11 DIAGNOSIS — M79604 Pain in right leg: Secondary | ICD-10-CM | POA: Diagnosis not present

## 2018-10-11 DIAGNOSIS — M545 Low back pain: Secondary | ICD-10-CM | POA: Diagnosis not present

## 2018-10-17 ENCOUNTER — Ambulatory Visit: Payer: Medicare Other | Admitting: Sports Medicine

## 2018-10-17 DIAGNOSIS — M545 Low back pain: Secondary | ICD-10-CM | POA: Diagnosis not present

## 2018-10-17 DIAGNOSIS — M79604 Pain in right leg: Secondary | ICD-10-CM | POA: Diagnosis not present

## 2018-10-17 DIAGNOSIS — M25551 Pain in right hip: Secondary | ICD-10-CM | POA: Diagnosis not present

## 2018-10-17 DIAGNOSIS — M5431 Sciatica, right side: Secondary | ICD-10-CM | POA: Diagnosis not present

## 2018-10-18 ENCOUNTER — Other Ambulatory Visit: Payer: Self-pay | Admitting: Family Medicine

## 2018-10-19 ENCOUNTER — Encounter: Payer: Self-pay | Admitting: Family Medicine

## 2018-10-19 ENCOUNTER — Ambulatory Visit (INDEPENDENT_AMBULATORY_CARE_PROVIDER_SITE_OTHER): Payer: Medicare Other | Admitting: Family Medicine

## 2018-10-19 ENCOUNTER — Ambulatory Visit: Payer: Self-pay

## 2018-10-19 ENCOUNTER — Other Ambulatory Visit: Payer: Self-pay

## 2018-10-19 VITALS — BP 128/90 | HR 97 | Ht 62.0 in | Wt 203.0 lb

## 2018-10-19 DIAGNOSIS — M25562 Pain in left knee: Secondary | ICD-10-CM

## 2018-10-19 DIAGNOSIS — G8929 Other chronic pain: Secondary | ICD-10-CM

## 2018-10-19 DIAGNOSIS — M1712 Unilateral primary osteoarthritis, left knee: Secondary | ICD-10-CM

## 2018-10-19 NOTE — Patient Instructions (Addendum)
Very nice to meet you  Did the injection again today  Will get approval for the gel injections just in case  Ice is your friend See me again in 4 weeks if not better and we will do gel injection otherwise can repeat this injection every 3 months

## 2018-10-19 NOTE — Assessment & Plan Note (Signed)
Repeat steroid injection given today.  Discussed icing regimen and home exercises.  Discussed which activities of doing which wants to avoid.  Patient will increase activity as tolerated.  Follow-up again in 4 to 6 weeks if needed patient could be a candidate for Visco supplementation will try to get prior approval.

## 2018-10-19 NOTE — Progress Notes (Addendum)
[name] [address] [place], [place] [address] Phone: [phone] Subjective:      CC: Left knee pain  [id number]  Lynn [name] is a 68 y.o. female coming in with complaint of left knee pain. Last visit was w Dr. Paulla Fore on 07/18/2018. Is wanting zilretta injection today. Did have fluid drawn off last visit. Did have some relief but pain is back. Pain located in anterior knee.  Patient has had injections previously.  Patient has known mild to moderate osteoarthritic changes.  We did review patient's last x-rays.  Patient has also tried meloxicam, topical anti-inflammatories with mild benefit.  States recently though the pain in the knee is increasing.  Affecting daily activities and even having discomfort at night.    Past Medical History:  Diagnosis Date  . Hyperlipidemia   . Hypothyroidism    Past Surgical History:  Procedure Laterality Date  . KNEE ARTHROSCOPY    . PARTIAL HYSTERECTOMY    . TONSILLECTOMY    . TUBAL LIGATION     Social History   Socioeconomic History  . Marital status: Widowed    Spouse name: Not on file  . Number of children: Not on file  . Years of education: Not on file  . Highest education level: Not on file  Occupational History  . Not on file  Social Needs  . Financial resource strain: Not on file  . Food insecurity:    Worry: Not on file    Inability: Not on file  . Transportation needs:    Medical: Not on file    Non-medical: Not on file  Tobacco Use  . Smoking status: Never Smoker  . Smokeless tobacco: Never Used  Substance and Sexual Activity  . Alcohol use: No  . Drug use: No  . Sexual activity: Not on file  Lifestyle  . Physical activity:    Days per week: Not on file    Minutes per session: Not on file  . Stress: Not on file  Relationships  . Social connections:    Talks on phone: Not on file    Gets together: Not on file    Attends religious service: Not on file    Active member of club  or organization: Not on file    Attends meetings of clubs or organizations: Not on file    Relationship status: Not on file  Other Topics Concern  . Not on file  Social History Narrative  . Not on file   Allergies  Allergen Reactions  . Sulfa Antibiotics Nausea Only  . Brimonidine Tartrate    Family History  Problem Relation Age of Onset  . Colon cancer Father   . Cancer Father        skin  . Cancer Paternal Grandfather        skin  . Pancreatic cancer Neg Hx   . Stomach cancer Neg Hx     Current Outpatient Medications (Endocrine & Metabolic):  .  levothyroxine (SYNTHROID, LEVOTHROID) 100 MCG tablet, TAKE 1 TABLET BY MOUTH EVERY DAY  Current Outpatient Medications (Cardiovascular):  .  atorvastatin (LIPITOR) 40 MG tablet, TAKE 1 TABLET BY MOUTH EVERY DAY .  furosemide (LASIX) 40 MG tablet, Take 1 tablet (40 mg total) by mouth daily.  Current Outpatient Medications (Respiratory):  .  fluticasone (FLONASE) 50 MCG/ACT nasal spray, USE 2 SPRAYS IN EACH NOSTRIL ONCE A DAY .  promethazine (PHENERGAN) 25 MG tablet, Take 25 mg by mouth every  6 (six) hours as needed for nausea or vomiting.  Current Outpatient Medications (Analgesics):  .  meloxicam (MOBIC) 15 MG tablet, TAKE 1 TABLET BY MOUTH EVERY DAY   Current Outpatient Medications (Other):  .  amoxicillin-clavulanate (AUGMENTIN) 875-125 MG tablet, Take 1 tablet by mouth 2 (two) times daily. .  diclofenac sodium (VOLTAREN) 1 % GEL, Apply topically to affected area qid .  fluconazole (DIFLUCAN) 150 MG tablet, Take one tablet today .  gabapentin (NEURONTIN) 300 MG capsule, PLEASE SEE ATTACHED FOR DETAILED DIRECTIONS .  MELATONIN PO, Take 1 tablet by mouth at bedtime as needed. .  Multiple Vitamin (MULTIVITAMIN) tablet, Take 1 tablet by mouth daily. .  Probiotic Product (PROBIOTIC-10) CHEW, Chew by mouth.    Past medical history, social, surgical and family history all reviewed in electronic medical record.  No pertanent  information unless stated regarding to the chief complaint.   Review of Systems:  No headache, visual changes, nausea, vomiting, diarrhea, constipation, dizziness, abdominal pain, skin rash, fevers, chills, night sweats, weight loss, swollen lymph nodes, body aches,chest pain, shortness of breath, mood changes.  Positive muscle aches and joint swelling  Objective  Blood pressure 128/90, pulse 97, height 5\' 2"  (1.575 m), weight 203 lb (92.1 kg), SpO2 98 %.    General: No apparent distress alert and oriented x3 mood and affect normal, dressed appropriately.  HEENT: Pupils equal, extraocular movements intact  Respiratory: Patient's speak in full sentences and does not appear short of breath  Cardiovascular: tracelower extremity edema, non tender, no erythema  Skin: Warm dry intact with no signs of infection or rash on extremities or on axial skeleton.  Abdomen: Soft nontender  Neuro: Cranial nerves II through XII are intact, neurovascularly intact in all extremities with 2+ DTRs and 2+ pulses.  Lymph: No lymphadenopathy of posterior or anterior cervical chain or axillae bilaterally.  Gait antalgic gait MSK:  Non tender with full range of motion and good stability and symmetric strength and tone of shoulders, elbows, wrist, hip, and ankles bilaterally.  Moderate arthritic changes of multiple joints Left knee exam shows an effusion noted.  Patient does have some mild instability with valgus force.  Patient does have a abnormal thigh to calf ratio.  Lacks last 5 degrees of extension of the last 10 degrees of flexion.  Neurovascularly intact distally.  Procedure: Real-time Ultrasound Guided Injection of left knee Device: GE Logiq Q7 Ultrasound guided injection is preferred based studies that show increased duration, increased effect, greater accuracy, decreased procedural pain, increased response rate, and decreased cost with ultrasound guided versus blind injection.  Verbal informed consent  obtained.  Time-out conducted.  Noted no overlying erythema, induration, or other signs of local infection.  Skin prepped in a sterile fashion.  Local anesthesia: Topical Ethyl chloride.  With sterile technique and under real time ultrasound guidance: With a 22-gauge 2 inch needle patient was injected with 4 cc of 0.5% Marcaine and aspirated and 45 cc of straw-colored fluid then injected 1vial of long-acting triamcinolone Zilretta. This was from a superior lateral approach.  Completed without difficulty  Pain immediately resolved suggesting accurate placement of the medication.  Advised to call if fevers/chills, erythema, induration, drainage, or persistent bleeding.  Images permanently stored and available for review in the ultrasound unit.  Impression: Technically successful ultrasound guided injection.   Impression and Recommendations:     This case required medical decision making of moderate complexity. The above documentation has been reviewed and is accurate and complete Olevia Bowens  Tamala Julian, DO       Note: This dictation was prepared with Dragon dictation along with smaller phrase technology. Any transcriptional errors that result from this process are unintentional.

## 2018-10-24 ENCOUNTER — Ambulatory Visit (INDEPENDENT_AMBULATORY_CARE_PROVIDER_SITE_OTHER): Payer: Medicare Other | Admitting: Family Medicine

## 2018-10-24 ENCOUNTER — Other Ambulatory Visit: Payer: Self-pay

## 2018-10-24 ENCOUNTER — Encounter: Payer: Self-pay | Admitting: Family Medicine

## 2018-10-24 ENCOUNTER — Ambulatory Visit: Payer: Medicare Other | Admitting: Family Medicine

## 2018-10-24 VITALS — BP 128/90 | Temp 97.9°F | Ht 62.0 in | Wt 198.0 lb

## 2018-10-24 DIAGNOSIS — E785 Hyperlipidemia, unspecified: Secondary | ICD-10-CM | POA: Diagnosis not present

## 2018-10-24 DIAGNOSIS — E669 Obesity, unspecified: Secondary | ICD-10-CM

## 2018-10-24 DIAGNOSIS — M545 Low back pain: Secondary | ICD-10-CM | POA: Diagnosis not present

## 2018-10-24 DIAGNOSIS — M5431 Sciatica, right side: Secondary | ICD-10-CM | POA: Diagnosis not present

## 2018-10-24 DIAGNOSIS — M79604 Pain in right leg: Secondary | ICD-10-CM | POA: Diagnosis not present

## 2018-10-24 DIAGNOSIS — E038 Other specified hypothyroidism: Secondary | ICD-10-CM | POA: Diagnosis not present

## 2018-10-24 DIAGNOSIS — M25551 Pain in right hip: Secondary | ICD-10-CM | POA: Diagnosis not present

## 2018-10-24 MED ORDER — PROMETHAZINE HCL 25 MG PO TABS: 25.0000 mg | ORAL_TABLET | Freq: Four times a day (QID) | ORAL | 2 refills | Status: DC | PRN

## 2018-10-24 NOTE — Progress Notes (Signed)
Virtual Visit via Video   I connected with patient on 10/24/18 at  9:20 AM EDT by a video enabled telemedicine application and verified that I am speaking with the correct person using two identifiers.  Location patient: Home Location provider: Acup[facility]urist, Office Persons participating in the virtual visit: Patient, Provider, Kinsey (Jess B)  I discussed the limitations of evaluation and management by telemedicine and the availability of in person appointments. The patient expressed understanding and agreed to proceed.  Subjective:   HPI:  Hyperlipidemia- chronic problem, on Lipitor 40mg  daily.  No CP, SOB, HAs, visual changes, abd pain, N/V.  Hypothyroid- chronic problem, on Levothyroxine 140mcg daily.  Pt reports variable energy levels.  Denies changes to skin/hair/nails.  Obesity- BMI 36.21.  Pt has gained 5 lbs since last visit.  Pt is walking, riding stationary bike.  ROS:   See pertinent positives and negatives per HPI.  Patient Active Problem List   Diagnosis Date Noted  . Pain of right hip joint 05/21/2018  . Primary osteoarthritis of left knee 10/18/2017  . Physical exam 04/22/2016  . Hypothyroidism 10/24/2015  . Hyperlipidemia 10/24/2015  . Obesity (BMI 35.0-39.9 without comorbidity) 10/24/2015    Social History   Tobacco Use  . Smoking status: Never Smoker  . Smokeless tobacco: Never Used  Substance Use Topics  . Alcohol use: No    Current Outpatient Medications:  .  atorvastatin (LIPITOR) 40 MG tablet, TAKE 1 TABLET BY MOUTH EVERY DAY, Disp: 90 tablet, Rfl: 1 .  diclofenac sodium (VOLTAREN) 1 % GEL, Apply topically to affected area qid, Disp: 112 g, Rfl: 2 .  fluticasone (FLONASE) 50 MCG/ACT nasal spray, USE 2 SPRAYS IN EACH NOSTRIL ONCE A DAY, Disp: 16 g, Rfl: 3 .  furosemide (LASIX) 40 MG tablet, Take 1 tablet (40 mg total) by mouth daily., Disp: 30 tablet, Rfl: 3 .  gabapentin (NEURONTIN) 300 MG capsule, PLEASE SEE ATTACHED FOR DETAILED  DIRECTIONS, Disp: 270 capsule, Rfl: 1 .  levothyroxine (SYNTHROID, LEVOTHROID) 100 MCG tablet, TAKE 1 TABLET BY MOUTH EVERY DAY, Disp: 90 tablet, Rfl: 1 .  MELATONIN PO, Take 1 tablet by mouth at bedtime as needed., Disp: , Rfl:  .  meloxicam (MOBIC) 15 MG tablet, TAKE 1 TABLET BY MOUTH EVERY DAY, Disp: 30 tablet, Rfl: 0 .  Multiple Vitamin (MULTIVITAMIN) tablet, Take 1 tablet by mouth daily., Disp: , Rfl:  .  Probiotic Product (PROBIOTIC-10) CHEW, Chew by mouth., Disp: , Rfl:  .  promethazine (PHENERGAN) 25 MG tablet, Take 25 mg by mouth every 6 (six) hours as needed for nausea or vomiting., Disp: , Rfl:   Allergies  Allergen Reactions  . Sulfa Antibiotics Nausea Only  . Brimonidine Tartrate     Objective:   BP 128/90 Comment: wednesday from Dr. Tamala Julian  Temp 97.9 F (36.6 C) (Oral)   Ht 5\' 2"  (1.575 m)   Wt 198 lb (89.8 kg)   BMI 36.21 kg/m   AAOx3, NAD NCAT, EOMI No obvious CN deficits Coloring WNL Pt is able to speak clearly, coherently without shortness of breath or increased work of breathing.  Thought process is linear.  Mood is appropriate.   Assessment and Plan:   Hyperlipidemia- chronic problem, tolerating statin w/o difficulty.  Encouraged healthy diet and regular exercise.  Check labs.  Adjust meds prn   Hypothyroid- chronic problem.  Currently asymptomatic.  Check labs.  Adjust meds prn   Obesity- pt has gained 5 lbs since last visit but is using  her time at home to increase her exercise.  Check labs to risk stratify.  Will follow.   Annye Asa, MD 10/24/2018

## 2018-10-24 NOTE — Progress Notes (Signed)
I have discussed the procedure for the virtual visit with the patient who has given consent to proceed with assessment and treatment.   Valta Dillon L Rickeya Manus, CMA     

## 2018-10-26 ENCOUNTER — Telehealth: Payer: Self-pay | Admitting: Family Medicine

## 2018-10-26 NOTE — Telephone Encounter (Signed)
LM asking pt to call back to schedule a 96mth chol. And tsh check  With tabori.

## 2018-10-28 ENCOUNTER — Other Ambulatory Visit: Payer: Self-pay

## 2018-10-28 ENCOUNTER — Other Ambulatory Visit (INDEPENDENT_AMBULATORY_CARE_PROVIDER_SITE_OTHER): Payer: Medicare Other

## 2018-10-28 DIAGNOSIS — E785 Hyperlipidemia, unspecified: Secondary | ICD-10-CM | POA: Diagnosis not present

## 2018-10-28 DIAGNOSIS — E669 Obesity, unspecified: Secondary | ICD-10-CM | POA: Diagnosis not present

## 2018-10-28 DIAGNOSIS — E038 Other specified hypothyroidism: Secondary | ICD-10-CM | POA: Diagnosis not present

## 2018-10-28 LAB — CBC WITH DIFFERENTIAL/PLATELET
Basophils Absolute: 0.1 10*3/uL (ref 0.0–0.1)
Basophils Relative: 1.2 % (ref 0.0–3.0)
Eosinophils Absolute: 0.1 10*3/uL (ref 0.0–0.7)
Eosinophils Relative: 1.1 % (ref 0.0–5.0)
HCT: 46.1 % — ABNORMAL HIGH (ref 36.0–46.0)
Hemoglobin: 15.8 g/dL — ABNORMAL HIGH (ref 12.0–15.0)
Lymphocytes Relative: 25.9 % (ref 12.0–46.0)
Lymphs Abs: 2 10*3/uL (ref 0.7–4.0)
MCHC: 34.3 g/dL (ref 30.0–36.0)
MCV: 86.2 fl (ref 78.0–100.0)
Monocytes Absolute: 0.6 10*3/uL (ref 0.1–1.0)
Monocytes Relative: 7.1 % (ref 3.0–12.0)
Neutro Abs: 5.1 10*3/uL (ref 1.4–7.7)
Neutrophils Relative %: 64.7 % (ref 43.0–77.0)
Platelets: 222 10*3/uL (ref 150.0–400.0)
RBC: 5.35 Mil/uL — ABNORMAL HIGH (ref 3.87–5.11)
RDW: 13.4 % (ref 11.5–15.5)
WBC: 7.9 10*3/uL (ref 4.0–10.5)

## 2018-10-28 LAB — HEPATIC FUNCTION PANEL
ALT: 15 U/L (ref 0–35)
AST: 17 U/L (ref 0–37)
Albumin: 4.2 g/dL (ref 3.5–5.2)
Alkaline Phosphatase: 78 U/L (ref 39–117)
Bilirubin, Direct: 0.2 mg/dL (ref 0.0–0.3)
Total Bilirubin: 1.1 mg/dL (ref 0.2–1.2)
Total Protein: 6.5 g/dL (ref 6.0–8.3)

## 2018-10-28 LAB — LIPID PANEL
Cholesterol: 193 mg/dL (ref 0–200)
HDL: 53.6 mg/dL (ref >39.00)
LDL Cholesterol: 116 mg/dL — ABNORMAL HIGH (ref 0–99)
NonHDL: 139.58
Total CHOL/HDL Ratio: 4
Triglycerides: 118 mg/dL (ref 0.0–149.0)
VLDL: 23.6 mg/dL (ref 0.0–40.0)

## 2018-10-28 LAB — BASIC METABOLIC PANEL
BUN: 13 mg/dL (ref 6–23)
CO2: 30 mEq/L (ref 19–32)
Calcium: 9.3 mg/dL (ref 8.4–10.5)
Chloride: 103 mEq/L (ref 96–112)
Creatinine, Ser: 1.04 mg/dL (ref 0.40–1.20)
GFR: 52.65 mL/min — ABNORMAL LOW (ref >60.00)
Glucose, Bld: 83 mg/dL (ref 70–99)
Potassium: 4.5 mEq/L (ref 3.5–5.1)
Sodium: 143 mEq/L (ref 135–145)

## 2018-10-28 LAB — TSH: TSH: 1.67 u[IU]/mL (ref 0.35–4.50)

## 2018-11-02 DIAGNOSIS — M5431 Sciatica, right side: Secondary | ICD-10-CM | POA: Diagnosis not present

## 2018-11-02 DIAGNOSIS — M545 Low back pain: Secondary | ICD-10-CM | POA: Diagnosis not present

## 2018-11-02 DIAGNOSIS — M79604 Pain in right leg: Secondary | ICD-10-CM | POA: Diagnosis not present

## 2018-11-02 DIAGNOSIS — M25551 Pain in right hip: Secondary | ICD-10-CM | POA: Diagnosis not present

## 2018-11-07 DIAGNOSIS — M5431 Sciatica, right side: Secondary | ICD-10-CM | POA: Diagnosis not present

## 2018-11-07 DIAGNOSIS — M25551 Pain in right hip: Secondary | ICD-10-CM | POA: Diagnosis not present

## 2018-11-07 DIAGNOSIS — M79604 Pain in right leg: Secondary | ICD-10-CM | POA: Diagnosis not present

## 2018-11-07 DIAGNOSIS — M545 Low back pain: Secondary | ICD-10-CM | POA: Diagnosis not present

## 2018-11-10 ENCOUNTER — Other Ambulatory Visit: Payer: Self-pay | Admitting: Family Medicine

## 2018-11-14 DIAGNOSIS — M545 Low back pain: Secondary | ICD-10-CM | POA: Diagnosis not present

## 2018-11-14 DIAGNOSIS — M25551 Pain in right hip: Secondary | ICD-10-CM | POA: Diagnosis not present

## 2018-11-14 DIAGNOSIS — M79604 Pain in right leg: Secondary | ICD-10-CM | POA: Diagnosis not present

## 2018-11-14 DIAGNOSIS — M5431 Sciatica, right side: Secondary | ICD-10-CM | POA: Diagnosis not present

## 2018-11-15 NOTE — Progress Notes (Signed)
[name] [address] [place], [place] [address] Phone: [phone] Subjective:   I Lynn [name] am serving as a Education administrator for Dr. Hulan Saas.  I'm seeing this patient by the request  of:   [name], MD   CC: knee pain  [id number]   10/19/2018 Repeat steroid injection given today.  Discussed icing regimen and home exercises.  Discussed which activities of doing which wants to avoid.  Patient will increase activity as tolerated.  Follow-up again in 4 to 6 weeks if needed patient could be a candidate for Visco supplementation will try to get prior approval.  11/16/2018 Lynn [name] is a 68 y.o. female coming in with complaint of left knee pain. States that her knee is sore medially. Going down stairs is difficult.   States that the pain is fairly severe.  Increasing instability.  Patient feels that the steroid injection that was previously given is not working as well.     Past Medical History:  Diagnosis Date  . Hyperlipidemia   . Hypothyroidism    Past Surgical History:  Procedure Laterality Date  . KNEE ARTHROSCOPY    . PARTIAL HYSTERECTOMY    . TONSILLECTOMY    . TUBAL LIGATION     Social History   Socioeconomic History  . Marital status: Widowed    Spouse name: Not on file  . Number of children: Not on file  . Years of education: Not on file  . Highest education level: Not on file  Occupational History  . Not on file  Social Needs  . Financial resource strain: Not on file  . Food insecurity:    Worry: Not on file    Inability: Not on file  . Transportation needs:    Medical: Not on file    Non-medical: Not on file  Tobacco Use  . Smoking status: Never Smoker  . Smokeless tobacco: Never Used  Substance and Sexual Activity  . Alcohol use: No  . Drug use: No  . Sexual activity: Not on file  Lifestyle  . Physical activity:    Days per week: Not on file    Minutes per session: Not on file  . Stress: Not on  file  Relationships  . Social connections:    Talks on phone: Not on file    Gets together: Not on file    Attends religious service: Not on file    Active member of club or organization: Not on file    Attends meetings of clubs or organizations: Not on file    Relationship status: Not on file  Other Topics Concern  . Not on file  Social History Narrative  . Not on file   Allergies  Allergen Reactions  . Sulfa Antibiotics Nausea Only  . Brimonidine Tartrate    Family History  Problem Relation Age of Onset  . Colon cancer Father   . Cancer Father        skin  . Cancer Paternal Grandfather        skin  . Pancreatic cancer Neg Hx   . Stomach cancer Neg Hx     Current Outpatient Medications (Endocrine & Metabolic):  .  levothyroxine (SYNTHROID, LEVOTHROID) 100 MCG tablet, TAKE 1 TABLET BY MOUTH EVERY DAY  Current Outpatient Medications (Cardiovascular):  .  atorvastatin (LIPITOR) 40 MG tablet, TAKE 1 TABLET BY MOUTH EVERY DAY .  furosemide (LASIX) 40 MG tablet, Take 1 tablet (40 mg total) by mouth  daily.  Current Outpatient Medications (Respiratory):  .  fluticasone (FLONASE) 50 MCG/ACT nasal spray, USE 2 SPRAYS IN EACH NOSTRIL ONCE A DAY .  promethazine (PHENERGAN) 25 MG tablet, Take 1 tablet (25 mg total) by mouth every 6 (six) hours as needed for nausea or vomiting.  Current Outpatient Medications (Analgesics):  .  meloxicam (MOBIC) 15 MG tablet, TAKE 1 TABLET BY MOUTH EVERY DAY   Current Outpatient Medications (Other):  .  diclofenac sodium (VOLTAREN) 1 % GEL, Apply topically to affected area qid .  gabapentin (NEURONTIN) 300 MG capsule, PLEASE SEE ATTACHED FOR DETAILED DIRECTIONS .  MELATONIN PO, Take 1 tablet by mouth at bedtime as needed. .  Multiple Vitamin (MULTIVITAMIN) tablet, Take 1 tablet by mouth daily. .  Probiotic Product (PROBIOTIC-10) CHEW, Chew by mouth.    Past medical history, social, surgical and family history all reviewed in electronic medical  record.  No pertanent information unless stated regarding to the chief complaint.   Review of Systems:  No headache, visual changes, nausea, vomiting, diarrhea, constipation, dizziness, abdominal pain, skin rash, fevers, chills, night sweats, weight loss, swollen lymph nodes, body aches, joint swelling,, chest pain, shortness of breath, mood changes.  Positive muscle aches  Objective  Blood pressure 124/84, pulse 96, height 5\' 2"  (1.575 m), weight 199 lb (90.3 kg), SpO2 96 %.    General: No apparent distress alert and oriented x3 mood and affect normal, dressed appropriately.  HEENT: Pupils equal, extraocular movements intact  Respiratory: Patient's speak in full sentences and does not appear short of breath  Cardiovascular: No lower extremity edema, non tender, no erythema  Skin: Warm dry intact with no signs of infection or rash on extremities or on axial skeleton.  Abdomen: Soft nontender  Neuro: Cranial nerves II through XII are intact, neurovascularly intact in all extremities with 2+ DTRs and 2+ pulses.  Lymph: No lymphadenopathy of posterior or anterior cervical chain or axillae bilaterally.  Gait severely antalgic MSK:  Non tender with full range of motion and good stability and symmetric strength and tone of shoulders, elbows, wrist, hip and ankles bilaterally.  Knee: Left Varus deformity noted. Large thigh to calf ratio.  Tender to palpation over medial and PF joint line.  ROM full in flexion and extension and lower leg rotation. instability with valgus force.  painful patellar compression. Patellar glide with moderate crepitus. Patellar and quadriceps tendons unremarkable. Hamstring and quadriceps strength is normal. Contralateral knee shows arthritic changes as well  After informed written and verbal consent, patient was seated on exam table. Left knee was prepped with alcohol swab and utilizing anterolateral approach, patient's left knee space was injected with 22 mg/mL of  Monovisc (sodium hyaluronate) in a prefilled syringe was injected easily into the knee through a 22-gauge needle..Patient tolerated the procedure well without immediate complications.    Impression and Recommendations:     This case required medical decision making of moderate complexity. The above documentation has been reviewed and is accurate and complete Lyndal Pulley, DO       Note: This dictation was prepared with Dragon dictation along with smaller phrase technology. Any transcriptional errors that result from this process are unintentional.

## 2018-11-16 ENCOUNTER — Other Ambulatory Visit: Payer: Self-pay

## 2018-11-16 ENCOUNTER — Ambulatory Visit (INDEPENDENT_AMBULATORY_CARE_PROVIDER_SITE_OTHER): Payer: Medicare Other | Admitting: Family Medicine

## 2018-11-16 ENCOUNTER — Encounter: Payer: Self-pay | Admitting: Family Medicine

## 2018-11-16 DIAGNOSIS — M1712 Unilateral primary osteoarthritis, left knee: Secondary | ICD-10-CM | POA: Diagnosis not present

## 2018-11-16 NOTE — Patient Instructions (Signed)
Good to see you  Ice 20 minutes 2 times daily. Usually after activity and before bed. They will call you on the brace Did monovisc today and should do well  But will take a whole month to work well but then should last about 6-12 months See me again in 6 weeks

## 2018-11-16 NOTE — Assessment & Plan Note (Signed)
Viscosupplementation given today.  Discussed that it will take about 4 weeks to have good results and hopefully will last 6 to 12 months.  Discussed icing regimen, home exercises.  Due to the knee still instability as well as the abnormal thigh to calf ratio custom bracing is necessary and we will start this.  Discussed home exercises, discussed the importance of weight loss.  Follow-up again in 6 weeks

## 2018-12-07 ENCOUNTER — Other Ambulatory Visit: Payer: Self-pay | Admitting: Family Medicine

## 2018-12-12 ENCOUNTER — Other Ambulatory Visit: Payer: Self-pay

## 2018-12-12 ENCOUNTER — Ambulatory Visit
Admission: RE | Admit: 2018-12-12 | Discharge: 2018-12-12 | Disposition: A | Discharge status: Home / Self Care | Payer: Medicare Other | Source: Ambulatory Visit | Attending: Family Medicine | Admitting: Family Medicine

## 2018-12-12 DIAGNOSIS — Z1231 Encounter for screening mammogram for malignant neoplasm of breast: Secondary | ICD-10-CM | POA: Diagnosis not present

## 2018-12-13 DIAGNOSIS — M79604 Pain in right leg: Secondary | ICD-10-CM | POA: Diagnosis not present

## 2018-12-13 DIAGNOSIS — M5431 Sciatica, right side: Secondary | ICD-10-CM | POA: Diagnosis not present

## 2018-12-13 DIAGNOSIS — M545 Low back pain: Secondary | ICD-10-CM | POA: Diagnosis not present

## 2018-12-13 DIAGNOSIS — M25551 Pain in right hip: Secondary | ICD-10-CM | POA: Diagnosis not present

## 2018-12-15 DIAGNOSIS — M5431 Sciatica, right side: Secondary | ICD-10-CM | POA: Diagnosis not present

## 2018-12-15 DIAGNOSIS — M79604 Pain in right leg: Secondary | ICD-10-CM | POA: Diagnosis not present

## 2018-12-15 DIAGNOSIS — M545 Low back pain: Secondary | ICD-10-CM | POA: Diagnosis not present

## 2018-12-15 DIAGNOSIS — M25551 Pain in right hip: Secondary | ICD-10-CM | POA: Diagnosis not present

## 2018-12-19 DIAGNOSIS — M545 Low back pain: Secondary | ICD-10-CM | POA: Diagnosis not present

## 2018-12-19 DIAGNOSIS — M25551 Pain in right hip: Secondary | ICD-10-CM | POA: Diagnosis not present

## 2018-12-19 DIAGNOSIS — M79604 Pain in right leg: Secondary | ICD-10-CM | POA: Diagnosis not present

## 2018-12-19 DIAGNOSIS — M5431 Sciatica, right side: Secondary | ICD-10-CM | POA: Diagnosis not present

## 2018-12-21 ENCOUNTER — Ambulatory Visit (INDEPENDENT_AMBULATORY_CARE_PROVIDER_SITE_OTHER): Payer: Medicare Other | Admitting: Family Medicine

## 2018-12-21 ENCOUNTER — Other Ambulatory Visit: Payer: Self-pay

## 2018-12-21 DIAGNOSIS — J019 Acute sinusitis, unspecified: Secondary | ICD-10-CM

## 2018-12-21 MED ORDER — AZITHROMYCIN 250 MG PO TABS: ORAL_TABLET | ORAL | 0 refills | Status: DC

## 2018-12-21 MED ORDER — FLUCONAZOLE 150 MG PO TABS: 150.0000 mg | ORAL_TABLET | Freq: Once | ORAL | 1 refills | Status: AC

## 2018-12-22 ENCOUNTER — Encounter: Payer: Self-pay | Admitting: Family Medicine

## 2018-12-22 NOTE — Progress Notes (Signed)
   Subjective:    Patient ID: Lynn [name], female    DOB: 12-09-1950, 68 y.o.   MRN: [id number]  HPI Virtual Visit via Telephone Note  I connected with the patient on 12/22/18 at  4:15 PM EDT by telephone and verified that I am speaking with the correct person using two identifiers. We attempted to connect virtually but we had technical difficulties with the audio and video.     I discussed the limitations, risks, security and privacy concerns of performing an evaluation and management service by telephone and the availability of in person appointments. I also discussed with the patient that there may be a patient responsible charge related to this service. The patient expressed understanding and agreed to proceed.  Location patient: home Location provider: work or home office Participants present for the call: patient, provider Patient did not have a visit in the prior 7 days to address this/these issue(s).   History of Present Illness: Here for 3 days of sinus pressure, blowing green mucus from the nose, PND, and a dry cough. No fever or SOB or body aches or NVD. She has tried saline nasal irrigations and Mucinex.    Observations/Objective: Patient sounds cheerful and well on the phone. I do not appreciate any SOB. Speech and thought processing are grossly intact. Patient reported vitals:  Assessment and Plan: Sinusitis, treat with a Zpack. Alysia Penna, MD   Follow Up Instructions:     [phone] 5-10 [phone] 11-20 [id number] 21-30 I did not refer this patient for an OV in the next 24 hours for this/these issue(s).  I discussed the assessment and treatment plan with the patient. The patient was provided an opportunity to ask questions and all were answered. The patient agreed with the plan and demonstrated an understanding of the instructions.   The patient was advised to call back or seek an in-person evaluation if the symptoms worsen or if the condition fails to improve as  anticipated.  I provided  12 minutes of non-face-to-face time during this encounter.   Alysia Penna, MD    Review of Systems     Objective:   Physical Exam        Assessment & Plan:

## 2018-12-26 DIAGNOSIS — M545 Low back pain: Secondary | ICD-10-CM | POA: Diagnosis not present

## 2018-12-26 DIAGNOSIS — M79604 Pain in right leg: Secondary | ICD-10-CM | POA: Diagnosis not present

## 2018-12-26 DIAGNOSIS — M25551 Pain in right hip: Secondary | ICD-10-CM | POA: Diagnosis not present

## 2018-12-26 DIAGNOSIS — M5431 Sciatica, right side: Secondary | ICD-10-CM | POA: Diagnosis not present

## 2018-12-27 ENCOUNTER — Other Ambulatory Visit: Payer: Self-pay | Admitting: Family Medicine

## 2018-12-28 ENCOUNTER — Other Ambulatory Visit: Payer: Self-pay

## 2018-12-28 ENCOUNTER — Encounter: Payer: Self-pay | Admitting: Family Medicine

## 2018-12-28 ENCOUNTER — Ambulatory Visit (INDEPENDENT_AMBULATORY_CARE_PROVIDER_SITE_OTHER): Payer: Medicare Other | Admitting: Family Medicine

## 2018-12-28 DIAGNOSIS — M1712 Unilateral primary osteoarthritis, left knee: Secondary | ICD-10-CM | POA: Diagnosis not present

## 2018-12-28 NOTE — Progress Notes (Signed)
[name] [place], [place] [address] Phone: [phone] Subjective:   I [name] am serving as a Education administrator for Dr. Hulan Saas.   CC: Knee pain follow-up  UYQ:[id number]   11/16/2018 Viscosupplementation given today.  Discussed that it will take about 4 weeks to have good results and hopefully will last 6 to 12 months.  Discussed icing regimen, home exercises.  Due to the knee still instability as well as the abnormal thigh to calf ratio custom bracing is necessary and we will start this.  Discussed home exercises, discussed the importance of weight loss.  Follow-up again in 6 weeks  12/28/2018 Lynn [name] is a 68 y.o. female coming in with complaint of left knee pain. States she is doing better today. Believes the heat is making the knee swell.  Patient states that the pain now is 70 to 80% better.  Is able to do more activity.  Walking more regularly.  Also working in the pool still.     Past Medical History:  Diagnosis Date  . Basal cell carcinoma of skin 10/14/1994   Right breast  . Basal cell carcinoma of skin 12/07/2011   Left inner elbow - TX p BX  . Hyperlipidemia   . Hypothyroidism   . Squamous cell carcinoma of skin 12/07/2011   Left lower leg - TX p BX  . Squamous cell carcinoma of skin 02/02/2017   Left anterior thigh - CX3 + 5FU   Past Surgical History:  Procedure Laterality Date  . KNEE ARTHROSCOPY    . PARTIAL HYSTERECTOMY    . TONSILLECTOMY    . TUBAL LIGATION     Social History   Socioeconomic History  . Marital status: Widowed    Spouse name: Not on file  . Number of children: Not on file  . Years of education: Not on file  . Highest education level: Not on file  Occupational History  . Not on file  Social Needs  . Financial resource strain: Not on file  . Food insecurity    Worry: Not on file    Inability: Not on file  . Transportation needs    Medical: Not on file    Non-medical: Not on file   Tobacco Use  . Smoking status: Never Smoker  . Smokeless tobacco: Never Used  Substance and Sexual Activity  . Alcohol use: No  . Drug use: No  . Sexual activity: Not on file  Lifestyle  . Physical activity    Days per week: Not on file    Minutes per session: Not on file  . Stress: Not on file  Relationships  . Social Herbalist on phone: Not on file    Gets together: Not on file    Attends religious service: Not on file    Active member of club or organization: Not on file    Attends meetings of clubs or organizations: Not on file    Relationship status: Not on file  Other Topics Concern  . Not on file  Social History Narrative  . Not on file   Allergies  Allergen Reactions  . Sulfa Antibiotics Nausea Only  . Brimonidine Tartrate    Family History  Problem Relation Age of Onset  . Colon cancer Father   . Cancer Father        skin  . Cancer Paternal Grandfather        skin  . Pancreatic cancer  Neg Hx   . Stomach cancer Neg Hx     Current Outpatient Medications (Endocrine & Metabolic):  .  levothyroxine (SYNTHROID, LEVOTHROID) 100 MCG tablet, TAKE 1 TABLET BY MOUTH EVERY DAY  Current Outpatient Medications (Cardiovascular):  .  atorvastatin (LIPITOR) 40 MG tablet, TAKE 1 TABLET BY MOUTH EVERY DAY .  furosemide (LASIX) 40 MG tablet, TAKE 1 TABLET BY MOUTH EVERY DAY  Current Outpatient Medications (Respiratory):  .  fluticasone (FLONASE) 50 MCG/ACT nasal spray, INSTILL 2 SPRAYS IN EACH NOSTRIL ONCE A DAY .  promethazine (PHENERGAN) 25 MG tablet, Take 1 tablet (25 mg total) by mouth every 6 (six) hours as needed for nausea or vomiting.  Current Outpatient Medications (Analgesics):  .  meloxicam (MOBIC) 15 MG tablet, TAKE 1 TABLET BY MOUTH EVERY DAY   Current Outpatient Medications (Other):  .  azithromycin (ZITHROMAX Z-PAK) 250 MG tablet, As directed .  diclofenac sodium (VOLTAREN) 1 % GEL, Apply topically to affected area qid .  gabapentin  (NEURONTIN) 300 MG capsule, PLEASE SEE ATTACHED FOR DETAILED DIRECTIONS .  MELATONIN PO, Take 1 tablet by mouth at bedtime as needed. .  Multiple Vitamin (MULTIVITAMIN) tablet, Take 1 tablet by mouth daily. .  Probiotic Product (PROBIOTIC-10) CHEW, Chew by mouth.    Past medical history, social, surgical and family history all reviewed in electronic medical record.  No pertanent information unless stated regarding to the chief complaint.   Review of Systems:  No headache, visual changes, nausea, vomiting, diarrhea, constipation, dizziness, abdominal pain, skin rash, fevers, chills, night sweats, weight loss, swollen lymph nodes, body aches,chest pain, shortness of breath, mood changes.  Positive muscle aches and joint swelling  Objective  Blood pressure (!) 150/90, pulse (!) 101, height 5\' 2"  (1.575 m), weight 205 lb (93 kg), SpO2 97 %.     General: No apparent distress alert and oriented x3 mood and affect normal, dressed appropriately.  HEENT: Pupils equal, extraocular movements intact  Respiratory: Patient's speak in full sentences and does not appear short of breath  Cardiovascular: No lower extremity edema, non tender, no erythema  Skin: Warm dry intact with no signs of infection or rash on extremities or on axial skeleton.  Abdomen: Soft nontender  Neuro: Cranial nerves II through XII are intact, neurovascularly intact in all extremities with 2+ DTRs and 2+ pulses.  Lymph: No lymphadenopathy of posterior or anterior cervical chain or axillae bilaterally.  Gait mild antalgic MSK:  Non tender with full range of motion and good stability and symmetric strength and tone of shoulders, elbows, wrist, hip, and ankles bilaterally.  Knee: Left Varus deformity noted.  Abnormal thigh to calf ratio.  Tender to palpation over medial and PF joint line.  ROM full in flexion and extension and lower leg rotation. instability with valgus force.  painful patellar compression. Patellar glide with  moderate crepitus. Patellar and quadriceps tendons unremarkable. Hamstring and quadriceps strength is normal. Contralateral knee shows mild arthritic changes     Impression and Recommendations:      The above documentation has been reviewed and is accurate and complete Lyndal Pulley, DO       Note: This dictation was prepared with Dragon dictation along with smaller phrase technology. Any transcriptional errors that result from this process are unintentional.

## 2018-12-28 NOTE — Assessment & Plan Note (Signed)
Significant improvement with the viscosupplementation.  Encourage patient to continue with pain home exercises, icing regimen, staying active.  As long as patient does well will follow-up as needed

## 2018-12-28 NOTE — Patient Instructions (Signed)
Gatorade at [place]

## 2019-01-03 ENCOUNTER — Telehealth: Payer: Self-pay | Admitting: Family Medicine

## 2019-01-03 NOTE — Telephone Encounter (Signed)
Paperwork given to PCP for completion.  

## 2019-01-03 NOTE — Telephone Encounter (Signed)
FYi

## 2019-01-03 NOTE — Telephone Encounter (Signed)
Picked  up and faxed  

## 2019-01-03 NOTE — Telephone Encounter (Signed)
Form completed and placed in basket  

## 2019-01-03 NOTE — Telephone Encounter (Signed)
I have placed a plan of care from [facility] in the bin up front with a charge sheet.

## 2019-01-04 DIAGNOSIS — M5431 Sciatica, right side: Secondary | ICD-10-CM | POA: Diagnosis not present

## 2019-01-04 DIAGNOSIS — M545 Low back pain: Secondary | ICD-10-CM | POA: Diagnosis not present

## 2019-01-04 DIAGNOSIS — M25551 Pain in right hip: Secondary | ICD-10-CM | POA: Diagnosis not present

## 2019-01-04 DIAGNOSIS — M79604 Pain in right leg: Secondary | ICD-10-CM | POA: Diagnosis not present

## 2019-01-11 DIAGNOSIS — M5431 Sciatica, right side: Secondary | ICD-10-CM | POA: Diagnosis not present

## 2019-01-11 DIAGNOSIS — M25551 Pain in right hip: Secondary | ICD-10-CM | POA: Diagnosis not present

## 2019-01-11 DIAGNOSIS — M79604 Pain in right leg: Secondary | ICD-10-CM | POA: Diagnosis not present

## 2019-01-11 DIAGNOSIS — M545 Low back pain: Secondary | ICD-10-CM | POA: Diagnosis not present

## 2019-01-19 ENCOUNTER — Telehealth: Payer: Self-pay | Admitting: Family Medicine

## 2019-01-19 DIAGNOSIS — M25551 Pain in right hip: Secondary | ICD-10-CM | POA: Diagnosis not present

## 2019-01-19 DIAGNOSIS — M545 Low back pain: Secondary | ICD-10-CM | POA: Diagnosis not present

## 2019-01-19 DIAGNOSIS — M5431 Sciatica, right side: Secondary | ICD-10-CM | POA: Diagnosis not present

## 2019-01-19 DIAGNOSIS — M79604 Pain in right leg: Secondary | ICD-10-CM | POA: Diagnosis not present

## 2019-01-19 NOTE — Telephone Encounter (Signed)
Form completed and placed in basket  

## 2019-01-19 NOTE — Telephone Encounter (Signed)
I have placed a plan of care from [facility] in the bin up front.

## 2019-01-19 NOTE — Telephone Encounter (Signed)
Picked up from the back and faxed to # on the form

## 2019-01-19 NOTE — Telephone Encounter (Signed)
fyi

## 2019-01-19 NOTE — Telephone Encounter (Signed)
Paperwork given to PCP for completion.  

## 2019-01-20 ENCOUNTER — Encounter: Payer: Self-pay | Admitting: Gastroenterology

## 2019-01-26 DIAGNOSIS — M79604 Pain in right leg: Secondary | ICD-10-CM | POA: Diagnosis not present

## 2019-01-26 DIAGNOSIS — M545 Low back pain: Secondary | ICD-10-CM | POA: Diagnosis not present

## 2019-01-26 DIAGNOSIS — M25551 Pain in right hip: Secondary | ICD-10-CM | POA: Diagnosis not present

## 2019-01-26 DIAGNOSIS — M5431 Sciatica, right side: Secondary | ICD-10-CM | POA: Diagnosis not present

## 2019-02-02 DIAGNOSIS — M25551 Pain in right hip: Secondary | ICD-10-CM | POA: Diagnosis not present

## 2019-02-02 DIAGNOSIS — M79604 Pain in right leg: Secondary | ICD-10-CM | POA: Diagnosis not present

## 2019-02-02 DIAGNOSIS — M545 Low back pain: Secondary | ICD-10-CM | POA: Diagnosis not present

## 2019-02-02 DIAGNOSIS — M5431 Sciatica, right side: Secondary | ICD-10-CM | POA: Diagnosis not present

## 2019-02-09 ENCOUNTER — Other Ambulatory Visit: Payer: Self-pay

## 2019-02-09 ENCOUNTER — Ambulatory Visit (AMBULATORY_SURGERY_CENTER): Payer: Self-pay | Admitting: *Deleted

## 2019-02-09 ENCOUNTER — Encounter: Payer: Medicare Other | Admitting: Gastroenterology

## 2019-02-09 VITALS — Temp 97.1°F | Ht 62.0 in | Wt 200.8 lb

## 2019-02-09 DIAGNOSIS — Z86000 Personal history of in-situ neoplasm of breast: Secondary | ICD-10-CM

## 2019-02-09 DIAGNOSIS — Z800 Family history of malignant neoplasm of digestive organs: Secondary | ICD-10-CM

## 2019-02-09 NOTE — Progress Notes (Signed)
No egg or soy allergy known to patient  No issues with past sedation with any surgeries  or procedures, no intubation problems  No diet pills per patient No home 02 use per patient  No blood thinners per patient  Pt denies issues with constipation  No A fib or A flutter  EMMI video sent to pt's e mail  

## 2019-02-10 DIAGNOSIS — M25551 Pain in right hip: Secondary | ICD-10-CM | POA: Diagnosis not present

## 2019-02-10 DIAGNOSIS — M79604 Pain in right leg: Secondary | ICD-10-CM | POA: Diagnosis not present

## 2019-02-10 DIAGNOSIS — M5431 Sciatica, right side: Secondary | ICD-10-CM | POA: Diagnosis not present

## 2019-02-10 DIAGNOSIS — M545 Low back pain: Secondary | ICD-10-CM | POA: Diagnosis not present

## 2019-02-14 ENCOUNTER — Ambulatory Visit: Payer: Self-pay

## 2019-02-14 ENCOUNTER — Other Ambulatory Visit: Payer: Self-pay

## 2019-02-14 ENCOUNTER — Encounter: Payer: Self-pay | Admitting: Family Medicine

## 2019-02-14 ENCOUNTER — Ambulatory Visit (INDEPENDENT_AMBULATORY_CARE_PROVIDER_SITE_OTHER): Payer: Medicare Other | Admitting: Family Medicine

## 2019-02-14 VITALS — BP 122/76 | HR 68 | Ht 62.0 in | Wt 201.0 lb

## 2019-02-14 DIAGNOSIS — G8929 Other chronic pain: Secondary | ICD-10-CM

## 2019-02-14 DIAGNOSIS — M25562 Pain in left knee: Secondary | ICD-10-CM

## 2019-02-14 DIAGNOSIS — M1712 Unilateral primary osteoarthritis, left knee: Secondary | ICD-10-CM | POA: Diagnosis not present

## 2019-02-14 NOTE — Assessment & Plan Note (Addendum)
Patient given another injection today and tolerated the procedure well.  Discussed icing regimen and home exercises.  Discussed which activities to doing which wants to avoid.  Due to the instability we did discuss the possibility of custom bracing which patient declined.  Encourage weight loss.  Patient will be due for Visco supplementation again in December.  Follow-up with me again in 4 to 8 weeks

## 2019-02-14 NOTE — Patient Instructions (Signed)
Good to see you  You should do well  See  me again in 12/10 for the gel injection

## 2019-02-14 NOTE — Progress Notes (Signed)
[name] [address] [name], [place] [address] Phone: [phone] Subjective:   Lynn [name], am serving as a scribe for Dr. Hulan Saas.  I'm seeing this patient by the request  of:    CC: Knee pain follow-up  [id number]   12/28/2018 Significant improvement with the viscosupplementation.  Encourage patient to continue with pain home exercises, icing regimen, staying active.  As long as patient does well will follow-up as needed  Update 02/14/2019 Lynn [name] is a 68 y.o. female coming in with complaint of left knee pain. Patient states that her left knee has becoming progressively worse. Sit to stand is aggravating. Has been using ice.      Past Medical History:  Diagnosis Date  . Allergy   . Arthritis    left knee  . Basal cell carcinoma of skin 10/14/1994   Right breast  . Basal cell carcinoma of skin 12/07/2011   Left inner elbow - TX p BX  . Hyperlipidemia   . Hypothyroidism   . Squamous cell carcinoma of skin 12/07/2011   Left lower leg - TX p BX  . Squamous cell carcinoma of skin 02/02/2017   Left anterior thigh - CX3 + 5FU   Past Surgical History:  Procedure Laterality Date  . c-section 1983    . COLOSTOMY    . KNEE ARTHROSCOPY    . PARTIAL HYSTERECTOMY    . POLYPECTOMY    . TONSILLECTOMY    . TUBAL LIGATION     Social History   Socioeconomic History  . Marital status: Widowed    Spouse name: Not on file  . Number of children: Not on file  . Years of education: Not on file  . Highest education level: Not on file  Occupational History  . Not on file  Social Needs  . Financial resource strain: Not on file  . Food insecurity    Worry: Not on file    Inability: Not on file  . Transportation needs    Medical: Not on file    Non-medical: Not on file  Tobacco Use  . Smoking status: Never Smoker  . Smokeless tobacco: Never Used  Substance and Sexual Activity  . Alcohol use: [name]  . Drug use: [name]  . Sexual  activity: Not on file  Lifestyle  . Physical activity    Days per week: Not on file    Minutes per session: Not on file  . Stress: Not on file  Relationships  . Social Herbalist on phone: Not on file    Gets together: Not on file    Attends religious service: Not on file    Active member of club or organization: Not on file    Attends meetings of clubs or organizations: Not on file    Relationship status: Not on file  Other Topics Concern  . Not on file  Social History Narrative  . Not on file   Allergies  Allergen Reactions  . Sulfa Antibiotics Nausea Only  . Brimonidine Tartrate    Family History  Problem Relation Age of Onset  . Colon cancer Father   . Cancer Father        skin  . Cancer Paternal Grandfather        skin  . Colon polyps Sister   . Colon polyps Sister   . Colon polyps Sister   . Colon polyps Sister   . Pancreatic cancer Neg Hx   .  Stomach cancer Neg Hx   . Esophageal cancer Neg Hx   . Rectal cancer Neg Hx     Current Outpatient Medications (Endocrine & Metabolic):  .  levothyroxine (SYNTHROID, LEVOTHROID) 100 MCG tablet, TAKE 1 TABLET BY MOUTH EVERY DAY  Current Outpatient Medications (Cardiovascular):  .  atorvastatin (LIPITOR) 40 MG tablet, TAKE 1 TABLET BY MOUTH EVERY DAY .  furosemide (LASIX) 40 MG tablet, TAKE 1 TABLET BY MOUTH EVERY DAY  Current Outpatient Medications (Respiratory):  .  fluticasone (FLONASE) 50 MCG/ACT nasal spray, INSTILL 2 SPRAYS IN EACH NOSTRIL ONCE A DAY .  promethazine (PHENERGAN) 25 MG tablet, Take 1 tablet (25 mg total) by mouth every 6 (six) hours as needed for nausea or vomiting.  Current Outpatient Medications (Analgesics):  .  meloxicam (MOBIC) 15 MG tablet, TAKE 1 TABLET BY MOUTH EVERY DAY   Current Outpatient Medications (Other):  .  diclofenac sodium (VOLTAREN) 1 % GEL, Apply topically to affected area qid .  gabapentin (NEURONTIN) 300 MG capsule, PLEASE SEE ATTACHED FOR DETAILED DIRECTIONS .   MELATONIN PO, Take 1 tablet by mouth at bedtime as needed. .  Multiple Vitamin (MULTIVITAMIN) tablet, Take 1 tablet by mouth daily. .  Probiotic Product (PROBIOTIC-10) CHEW, Chew by mouth.    Past medical history, social, surgical and family history all reviewed in electronic medical record.  [name] pertanent information unless stated regarding to the chief complaint.   Review of Systems:  [name] headache, visual changes, nausea, vomiting, diarrhea, constipation, dizziness, abdominal pain, skin rash, fevers, chills, night sweats, weight loss, swollen lymph nodes, body aches, joint swelling, chest pain, shortness of breath, mood changes.  Positive muscle aches  Objective  Blood pressure 122/76, pulse 68, height 5\' 2"  (1.575 m), weight 201 lb (91.2 kg), SpO2 98 %.    General: [name] apparent distress alert and oriented x3 mood and affect normal, dressed appropriately.  HEENT: Pupils equal, extraocular movements intact  Respiratory: Patient's speak in full sentences and does not appear short of breath  Cardiovascular: [name] lower extremity edema, non tender, [name] erythema  Skin: Warm dry intact with [name] signs of infection or rash on extremities or on axial skeleton.  Abdomen: Soft nontender  Neuro: Cranial nerves II through XII are intact, neurovascularly intact in all extremities with 2+ DTRs and 2+ pulses.  Lymph: [name] lymphadenopathy of posterior or anterior cervical chain or axillae bilaterally.  Gait antalgic MSK:  tender with mild limited range of motion and good stability and symmetric strength and tone of shoulders, elbows, wrist, hip, and ankles bilaterally.   Left knee exam does have some varus deformity instability noted.  Patient does have a lack of the last 5 degrees of extension effusion noted.  Procedure: Real-time Ultrasound Guided Injection of left knee Device: GE Logiq Q7 Ultrasound guided injection is preferred based studies that show increased duration, increased effect, greater accuracy,  decreased procedural pain, increased response rate, and decreased cost with ultrasound guided versus blind injection.  Verbal informed consent obtained.  Time-out conducted.  Noted [name] overlying erythema, induration, or other signs of local infection.  Skin prepped in a sterile fashion.  Local anesthesia: Topical Ethyl chloride.  With sterile technique and under real time ultrasound guidance: With a 22-gauge 2 inch needle patient was injected with 4 cc of 0.5% Marcaine and aspirated 40 cc of straw-colored fluid and injected 1 cc of Kenalog 40 mg/dL. This was from a superior lateral approach.  Completed without difficulty  Pain immediately resolved suggesting accurate placement  of the medication.  Advised to call if fevers/chills, erythema, induration, drainage, or persistent bleeding.  Images permanently stored and available for review in the ultrasound unit.  Impression: Technically successful ultrasound guided injection.   Impression and Recommendations:     This case required medical decision making of moderate complexity. The above documentation has been reviewed and is accurate and complete Lyndal Pulley, DO       Note: This dictation was prepared with Dragon dictation along with smaller phrase technology. Any transcriptional errors that result from this process are unintentional.

## 2019-02-16 DIAGNOSIS — M5431 Sciatica, right side: Secondary | ICD-10-CM | POA: Diagnosis not present

## 2019-02-16 DIAGNOSIS — M25551 Pain in right hip: Secondary | ICD-10-CM | POA: Diagnosis not present

## 2019-02-16 DIAGNOSIS — M545 Low back pain: Secondary | ICD-10-CM | POA: Diagnosis not present

## 2019-02-16 DIAGNOSIS — M79604 Pain in right leg: Secondary | ICD-10-CM | POA: Diagnosis not present

## 2019-02-17 ENCOUNTER — Telehealth: Payer: Self-pay | Admitting: Family Medicine

## 2019-02-17 ENCOUNTER — Ambulatory Visit (INDEPENDENT_AMBULATORY_CARE_PROVIDER_SITE_OTHER): Payer: Medicare Other | Admitting: *Deleted

## 2019-02-17 ENCOUNTER — Other Ambulatory Visit: Payer: Self-pay

## 2019-02-17 DIAGNOSIS — Z23 Encounter for immunization: Secondary | ICD-10-CM | POA: Diagnosis not present

## 2019-02-17 NOTE — Telephone Encounter (Signed)
I have placed a plan of care in the bin up front with a charge sheet.  

## 2019-02-17 NOTE — Telephone Encounter (Signed)
Paperwork in your folder for completion

## 2019-02-21 DIAGNOSIS — M25551 Pain in right hip: Secondary | ICD-10-CM | POA: Diagnosis not present

## 2019-02-21 DIAGNOSIS — M79604 Pain in right leg: Secondary | ICD-10-CM | POA: Diagnosis not present

## 2019-02-21 DIAGNOSIS — M545 Low back pain: Secondary | ICD-10-CM | POA: Diagnosis not present

## 2019-02-21 DIAGNOSIS — M5431 Sciatica, right side: Secondary | ICD-10-CM | POA: Diagnosis not present

## 2019-02-22 ENCOUNTER — Telehealth: Payer: Self-pay

## 2019-02-22 NOTE — Telephone Encounter (Signed)
Pt returned call and answered NO to all the Covid-19 screening calls

## 2019-02-22 NOTE — Telephone Encounter (Signed)
Covid-19 screening questions   Do you now or have you had a fever in the last 14 days?  Do you have any respiratory symptoms of shortness of breath or cough now or in the last 14 days?  Do you have any family members or close contacts with diagnosed or suspected Covid-19 in the past 14 days?  Have you been tested for Covid-19 and found to be positive?       

## 2019-02-23 ENCOUNTER — Ambulatory Visit (AMBULATORY_SURGERY_CENTER): Payer: Medicare Other | Admitting: Internal Medicine

## 2019-02-23 ENCOUNTER — Encounter: Payer: Self-pay | Admitting: Internal Medicine

## 2019-02-23 ENCOUNTER — Other Ambulatory Visit: Payer: Self-pay | Admitting: Internal Medicine

## 2019-02-23 ENCOUNTER — Other Ambulatory Visit: Payer: Self-pay

## 2019-02-23 ENCOUNTER — Encounter: Payer: Medicare Other | Admitting: Gastroenterology

## 2019-02-23 VITALS — BP 136/91 | HR 72 | Temp 98.5°F | Resp 22 | Ht 62.0 in | Wt 200.0 lb

## 2019-02-23 DIAGNOSIS — D124 Benign neoplasm of descending colon: Secondary | ICD-10-CM

## 2019-02-23 DIAGNOSIS — Z800 Family history of malignant neoplasm of digestive organs: Secondary | ICD-10-CM | POA: Diagnosis not present

## 2019-02-23 DIAGNOSIS — Z86010 Personal history of colonic polyps: Secondary | ICD-10-CM

## 2019-02-23 DIAGNOSIS — K635 Polyp of colon: Secondary | ICD-10-CM

## 2019-02-23 DIAGNOSIS — D125 Benign neoplasm of sigmoid colon: Secondary | ICD-10-CM

## 2019-02-23 DIAGNOSIS — E785 Hyperlipidemia, unspecified: Secondary | ICD-10-CM | POA: Diagnosis not present

## 2019-02-23 DIAGNOSIS — E039 Hypothyroidism, unspecified: Secondary | ICD-10-CM | POA: Diagnosis not present

## 2019-02-23 DIAGNOSIS — D123 Benign neoplasm of transverse colon: Secondary | ICD-10-CM

## 2019-02-23 DIAGNOSIS — Z1211 Encounter for screening for malignant neoplasm of colon: Secondary | ICD-10-CM | POA: Diagnosis not present

## 2019-02-23 MED ORDER — SODIUM CHLORIDE 0.9 % IV SOLN: 500.0000 mL | Freq: Once | INTRAVENOUS | Status: DC

## 2019-02-23 NOTE — Op Note (Signed)
[facility] Patient Name: Lynn [name] Procedure Date: 02/23/2019 9:20 AM MRN: [id number] Endoscopist: Jerene Bears , MD Age: 68 Referring MD:  Date of Birth: 04/14/1951 Gender: Female Account #: [id number] Procedure:                Colonoscopy Indications:              Family history of colon cancer in father, personal                            history of adenoma in 2009, last colonoscopy                            without polyps in 2015 Medicines:                Monitored Anesthesia Care Procedure:                Pre-Anesthesia Assessment:                           - Prior to the procedure, a History and Physical                            was performed, and patient medications and                            allergies were reviewed. The patient's tolerance of                            previous anesthesia was also reviewed. The risks                            and benefits of the procedure and the sedation                            options and risks were discussed with the patient.                            All questions were answered, and informed consent                            was obtained. Prior Anticoagulants: The patient has                            taken no previous anticoagulant or antiplatelet                            agents. ASA Grade Assessment: II - A patient with                            mild systemic disease. After reviewing the risks                            and benefits, the patient was deemed in  satisfactory condition to undergo the procedure.                           After obtaining informed consent, the colonoscope                            was passed under direct vision. Throughout the                            procedure, the patient's blood pressure, pulse, and                            oxygen saturations were monitored continuously. The                            Colonoscope was introduced through the anus  and                            advanced to the cecum, identified by appendiceal                            orifice and ileocecal valve. The colonoscopy was                            performed without difficulty. The patient tolerated                            the procedure well. The quality of the bowel                            preparation was good. The ileocecal valve,                            appendiceal orifice, and rectum were photographed. Scope In: 9:26:58 AM Scope Out: 9:41:23 AM Scope Withdrawal Time: 0 hours 9 minutes 59 seconds  Total Procedure Duration: 0 hours 14 minutes 25 seconds  Findings:                 The digital rectal exam was normal.                           A 5 mm polyp was found in the transverse colon. The                            polyp was sessile. The polyp was removed with a                            cold snare. Resection and retrieval were complete.                           A 4 mm polyp was found in the descending colon. The                            polyp was  sessile. The polyp was removed with a                            cold snare. Resection and retrieval were complete.                           A 5 mm polyp was found in the sigmoid colon. The                            polyp was flat. The polyp was removed with a cold                            snare. Resection and retrieval were complete.                           Multiple small and large-mouthed diverticula were                            found in the sigmoid colon and descending colon.                           Internal hemorrhoids were found during                            retroflexion. The hemorrhoids were small. Complications:            No immediate complications. Estimated Blood Loss:     Estimated blood loss was minimal. Impression:               - One 5 mm polyp in the transverse colon, removed                            with a cold snare. Resected and retrieved.                            - One 4 mm polyp in the descending colon, removed                            with a cold snare. Resected and retrieved.                           - One 5 mm polyp in the sigmoid colon, removed with                            a cold snare. Resected and retrieved.                           - Severe diverticulosis in the sigmoid colon and in                            the descending colon.                           - Small internal hemorrhoids.  Recommendation:           - Patient has a contact number available for                            emergencies. The signs and symptoms of potential                            delayed complications were discussed with the                            patient. Return to normal activities tomorrow.                            Written discharge instructions were provided to the                            patient.                           - Resume previous diet.                           - Continue present medications.                           - Await pathology results.                           - Repeat colonoscopy is recommended for                            surveillance. The colonoscopy date will be                            determined after pathology results from today's                            exam become available for review. Jerene Bears, MD 02/23/2019 9:44:58 AM This report has been signed electronically.

## 2019-02-23 NOTE — Progress Notes (Signed)
Tem check by JB/vital check by CW.  Lynn Monday, CRNA made aware of elevated BP.  Medical history updated.

## 2019-02-23 NOTE — Patient Instructions (Signed)
Discharge instructions given. Handouts on polyps,diverticulosis and hemorrhoids. Resume previous medications. YOU HAD AN ENDOSCOPIC PROCEDURE TODAY AT THE [facility]:   Refer to the procedure report that was given to you for any specific questions about what was found during the examination.  If the procedure report does not answer your questions, please call your gastroenterologist to clarify.  If you requested that your care partner not be given the details of your procedure findings, then the procedure report has been included in a sealed envelope for you to review at your convenience later.  YOU SHOULD EXPECT: Some feelings of bloating in the abdomen. Passage of more gas than usual.  Walking can help get rid of the air that was put into your GI tract during the procedure and reduce the bloating. If you had a lower endoscopy (such as a colonoscopy or flexible sigmoidoscopy) you may notice spotting of blood in your stool or on the toilet paper. If you underwent a bowel prep for your procedure, you may not have a normal bowel movement for a few days.  Please Note:  You might notice some irritation and congestion in your nose or some drainage.  This is from the oxygen used during your procedure.  There is no need for concern and it should clear up in a day or so.  SYMPTOMS TO REPORT IMMEDIATELY:   Following lower endoscopy (colonoscopy or flexible sigmoidoscopy):  Excessive amounts of blood in the stool  Significant tenderness or worsening of abdominal pains  Swelling of the abdomen that is new, acute  Fever of 100F or higher  For urgent or emergent issues, a gastroenterologist can be reached at any hour by calling [phone].   DIET:  We do recommend a small meal at first, but then you may proceed to your regular diet.  Drink plenty of fluids but you should avoid alcoholic beverages for 24 hours.  ACTIVITY:  You should plan to take it easy for the rest of today and you  should NOT DRIVE or use heavy machinery until tomorrow (because of the sedation medicines used during the test).    FOLLOW UP: Our staff will call the number listed on your records 48-72 hours following your procedure to check on you and address any questions or concerns that you may have regarding the information given to you following your procedure. If we do not reach you, we will leave a message.  We will attempt to reach you two times.  During this call, we will ask if you have developed any symptoms of COVID 19. If you develop any symptoms (ie: fever, flu-like symptoms, shortness of breath, cough etc.) before then, please call [phone].  If you test positive for Covid 19 in the 2 weeks post procedure, please call and report this information to us.    If any biopsies were taken you will be contacted by phone or by letter within the next 1-3 weeks.  Please call us at [phone] if you have not heard about the biopsies in 3 weeks.    SIGNATURES/CONFIDENTIALITY: You and/or your care partner have signed paperwork which will be entered into your electronic medical record.  These signatures attest to the fact that that the information above on your After Visit Summary has been reviewed and is understood.  Full responsibility of the confidentiality of this discharge information lies with you and/or your care-partner. 

## 2019-02-23 NOTE — Progress Notes (Signed)
Called to room to assist during endoscopic procedure.  Patient ID and intended procedure confirmed with present staff. Received instructions for my participation in the procedure from the performing physician.  

## 2019-02-23 NOTE — Progress Notes (Signed)
Report to PACU, RN, vss, BBS= Clear.  

## 2019-02-24 NOTE — Telephone Encounter (Signed)
fyi

## 2019-02-24 NOTE — Telephone Encounter (Signed)
Picked up from the back, faxed to the # provided on the form and sent to scan  

## 2019-02-24 NOTE — Telephone Encounter (Signed)
Form completed and placed in basket  

## 2019-02-27 ENCOUNTER — Telehealth: Payer: Self-pay

## 2019-02-27 NOTE — Telephone Encounter (Signed)
  Follow up Call-  Call back number 02/23/2019  Post procedure Call Back phone  # [phone]  Permission to leave phone message Yes  Some recent data might be hidden     Patient questions:  Do you have a fever, pain , or abdominal swelling? No. Pain Score  0 *  Have you tolerated food without any problems? Yes.    Have you been able to return to your normal activities? Yes.    Do you have any questions about your discharge instructions: Diet   No. Medications  No. Follow up visit  No.  Do you have questions or concerns about your Care? No.  Actions: * If pain score is 4 or above: No action needed, pain <4. 1. Have you developed a fever since your procedure? no  2.   Have you had an respiratory symptoms (SOB or cough) since your procedure? no  3.   Have you tested positive for COVID 19 since your procedure no  4.   Have you had any family members/close contacts diagnosed with the COVID 19 since your procedure?  no   If yes to any of these questions please route to Joylene John, RN and Alphonsa Gin, Therapist, sports.

## 2019-03-01 ENCOUNTER — Encounter: Payer: Self-pay | Admitting: Internal Medicine

## 2019-03-06 DIAGNOSIS — M79604 Pain in right leg: Secondary | ICD-10-CM | POA: Diagnosis not present

## 2019-03-06 DIAGNOSIS — M5431 Sciatica, right side: Secondary | ICD-10-CM | POA: Diagnosis not present

## 2019-03-06 DIAGNOSIS — M25551 Pain in right hip: Secondary | ICD-10-CM | POA: Diagnosis not present

## 2019-03-06 DIAGNOSIS — M545 Low back pain: Secondary | ICD-10-CM | POA: Diagnosis not present

## 2019-03-13 ENCOUNTER — Other Ambulatory Visit: Payer: Self-pay | Admitting: Family Medicine

## 2019-03-15 DIAGNOSIS — M79604 Pain in right leg: Secondary | ICD-10-CM | POA: Diagnosis not present

## 2019-03-15 DIAGNOSIS — M25551 Pain in right hip: Secondary | ICD-10-CM | POA: Diagnosis not present

## 2019-03-15 DIAGNOSIS — M545 Low back pain: Secondary | ICD-10-CM | POA: Diagnosis not present

## 2019-03-15 DIAGNOSIS — M5431 Sciatica, right side: Secondary | ICD-10-CM | POA: Diagnosis not present

## 2019-03-21 ENCOUNTER — Telehealth: Payer: Self-pay

## 2019-03-21 ENCOUNTER — Telehealth: Payer: Self-pay | Admitting: *Deleted

## 2019-03-21 DIAGNOSIS — M545 Low back pain: Secondary | ICD-10-CM | POA: Diagnosis not present

## 2019-03-21 DIAGNOSIS — M79604 Pain in right leg: Secondary | ICD-10-CM | POA: Diagnosis not present

## 2019-03-21 DIAGNOSIS — M5431 Sciatica, right side: Secondary | ICD-10-CM | POA: Diagnosis not present

## 2019-03-21 DIAGNOSIS — M25551 Pain in right hip: Secondary | ICD-10-CM | POA: Diagnosis not present

## 2019-03-21 NOTE — Telephone Encounter (Signed)
Patient called in stating that she is going to PT twice weekly and has been called for jury duty. Asking for Dr. Birdie Riddle to write a letter excusing her. Jury duty starting on Nov. 9th.

## 2019-03-21 NOTE — Telephone Encounter (Signed)
Pt left msg stating that she has been going to PT twice weekly and it has been helping. She would like for Dr. Tamala Julian to write her a letter to get out of jury duty so she can continue going to PT.

## 2019-03-22 ENCOUNTER — Other Ambulatory Visit: Payer: Self-pay | Admitting: Family Medicine

## 2019-03-22 NOTE — Telephone Encounter (Signed)
Patient was not referred to PT by Dr. Tamala Julian. Informed her that she needed to go to the referring provider to get a doctor's note.

## 2019-03-26 ENCOUNTER — Other Ambulatory Visit: Payer: Self-pay | Admitting: Family Medicine

## 2019-03-27 ENCOUNTER — Encounter: Payer: Self-pay | Admitting: Family Medicine

## 2019-03-27 NOTE — Telephone Encounter (Signed)
Letter printed and placed in basket

## 2019-03-27 NOTE — Telephone Encounter (Signed)
fyi

## 2019-03-27 NOTE — Telephone Encounter (Signed)
Picked up from the back and made pt aware  

## 2019-03-28 DIAGNOSIS — M25551 Pain in right hip: Secondary | ICD-10-CM | POA: Diagnosis not present

## 2019-03-28 DIAGNOSIS — M79604 Pain in right leg: Secondary | ICD-10-CM | POA: Diagnosis not present

## 2019-03-28 DIAGNOSIS — M5431 Sciatica, right side: Secondary | ICD-10-CM | POA: Diagnosis not present

## 2019-03-28 DIAGNOSIS — M545 Low back pain: Secondary | ICD-10-CM | POA: Diagnosis not present

## 2019-04-03 ENCOUNTER — Ambulatory Visit: Payer: Medicare Other | Admitting: Family Medicine

## 2019-04-03 ENCOUNTER — Telehealth: Payer: Self-pay | Admitting: Family Medicine

## 2019-04-03 NOTE — Telephone Encounter (Signed)
I have placed a plan of care in the bin up front from with a charge sheet.

## 2019-04-03 NOTE — Telephone Encounter (Signed)
Paperwork given to PCP.  

## 2019-04-06 NOTE — Telephone Encounter (Signed)
fyi

## 2019-04-06 NOTE — Telephone Encounter (Signed)
Picked up from the back and faxed and sent to scan

## 2019-04-06 NOTE — Telephone Encounter (Signed)
Form completed and placed in basket  

## 2019-04-08 ENCOUNTER — Other Ambulatory Visit: Payer: Self-pay | Admitting: Family Medicine

## 2019-04-11 DIAGNOSIS — M25551 Pain in right hip: Secondary | ICD-10-CM | POA: Diagnosis not present

## 2019-04-11 DIAGNOSIS — M5431 Sciatica, right side: Secondary | ICD-10-CM | POA: Diagnosis not present

## 2019-04-11 DIAGNOSIS — M545 Low back pain: Secondary | ICD-10-CM | POA: Diagnosis not present

## 2019-04-11 DIAGNOSIS — M79604 Pain in right leg: Secondary | ICD-10-CM | POA: Diagnosis not present

## 2019-04-24 ENCOUNTER — Ambulatory Visit: Payer: Medicare Other | Admitting: Family Medicine

## 2019-04-25 DIAGNOSIS — M79604 Pain in right leg: Secondary | ICD-10-CM | POA: Diagnosis not present

## 2019-04-25 DIAGNOSIS — M25551 Pain in right hip: Secondary | ICD-10-CM | POA: Diagnosis not present

## 2019-04-25 DIAGNOSIS — M545 Low back pain: Secondary | ICD-10-CM | POA: Diagnosis not present

## 2019-04-25 DIAGNOSIS — M5431 Sciatica, right side: Secondary | ICD-10-CM | POA: Diagnosis not present

## 2019-05-01 ENCOUNTER — Ambulatory Visit: Payer: Medicare Other | Admitting: Family Medicine

## 2019-05-03 DIAGNOSIS — L570 Actinic keratosis: Secondary | ICD-10-CM | POA: Diagnosis not present

## 2019-05-09 ENCOUNTER — Ambulatory Visit (INDEPENDENT_AMBULATORY_CARE_PROVIDER_SITE_OTHER): Payer: Medicare Other | Admitting: Family Medicine

## 2019-05-09 ENCOUNTER — Encounter: Payer: Self-pay | Admitting: Family Medicine

## 2019-05-09 ENCOUNTER — Other Ambulatory Visit: Payer: Self-pay

## 2019-05-09 VITALS — Ht 62.0 in | Wt 195.0 lb

## 2019-05-09 DIAGNOSIS — E785 Hyperlipidemia, unspecified: Secondary | ICD-10-CM | POA: Diagnosis not present

## 2019-05-09 DIAGNOSIS — E669 Obesity, unspecified: Secondary | ICD-10-CM | POA: Diagnosis not present

## 2019-05-09 DIAGNOSIS — E038 Other specified hypothyroidism: Secondary | ICD-10-CM

## 2019-05-09 NOTE — Progress Notes (Signed)
I have discussed the procedure for the virtual visit with the patient who has given consent to proceed with assessment and treatment.   Pt unable to obtain vitals.   Simone Rodenbeck L Yarethzy Croak, CMA     

## 2019-05-09 NOTE — Progress Notes (Signed)
Virtual Visit via Video   I connected with patient on 05/09/19 at  8:30 AM EST by a video enabled telemedicine application and verified that I am speaking with the correct person using two identifiers.  Location patient: Home Location provider: [facility], Office Persons participating in the virtual visit: Patient, Provider, Lolo (Jess B)  I discussed the limitations of evaluation and management by telemedicine and the availability of in person appointments. The patient expressed understanding and agreed to proceed.  Subjective:   HPI:   Hyperlipidemia- chronic problem, on Lipitor 40mg  daily.  No CP, SOB, HAs, abd pain, N/V  Hypothyroid- chronic problem, on Levothyroxine 120mcg daily.  Pt reports energy level fluctuates.  No changes to skin/hair/nails.  Obesity- pt is down 3 lbs since last visit. Pt reports decreasing her intake.  Limited exercise due to leg pain and gyms closed.  ROS:   See pertinent positives and negatives per HPI.  Patient Active Problem List   Diagnosis Date Noted  . Pain of right hip joint 05/21/2018  . Primary osteoarthritis of left knee 10/18/2017  . Physical exam 04/22/2016  . Hypothyroidism 10/24/2015  . Hyperlipidemia 10/24/2015  . Obesity (BMI 35.0-39.9 without comorbidity) 10/24/2015    Social History   Tobacco Use  . Smoking status: Never Smoker  . Smokeless tobacco: Never Used  Substance Use Topics  . Alcohol use: No    Current Outpatient Medications:  .  atorvastatin (LIPITOR) 40 MG tablet, TAKE 1 TABLET BY MOUTH EVERY DAY, Disp: 90 tablet, Rfl: 1 .  diclofenac sodium (VOLTAREN) 1 % GEL, Apply topically to affected area qid, Disp: 112 g, Rfl: 2 .  fluticasone (FLONASE) 50 MCG/ACT nasal spray, INSTILL 2 SPRAYS IN EACH NOSTRIL ONCE A DAY, Disp: 48 mL, Rfl: 1 .  furosemide (LASIX) 40 MG tablet, TAKE 1 TABLET BY MOUTH EVERY DAY, Disp: 90 tablet, Rfl: 1 .  gabapentin (NEURONTIN) 300 MG capsule, PLEASE SEE ATTACHED FOR DETAILED  DIRECTIONS, Disp: 270 capsule, Rfl: 1 .  levothyroxine (SYNTHROID) 100 MCG tablet, TAKE 1 TABLET BY MOUTH EVERY DAY, Disp: 90 tablet, Rfl: 1 .  MELATONIN PO, Take 1 tablet by mouth at bedtime as needed., Disp: , Rfl:  .  meloxicam (MOBIC) 15 MG tablet, TAKE 1 TABLET BY MOUTH EVERY DAY, Disp: 30 tablet, Rfl: 0 .  Multiple Vitamin (MULTIVITAMIN) tablet, Take 1 tablet by mouth daily., Disp: , Rfl:  .  Probiotic Product (PROBIOTIC-10) CHEW, Chew by mouth., Disp: , Rfl:  .  promethazine (PHENERGAN) 25 MG tablet, Take 1 tablet (25 mg total) by mouth every 6 (six) hours as needed for nausea or vomiting., Disp: 30 tablet, Rfl: 2  Allergies  Allergen Reactions  . Sulfa Antibiotics Nausea Only  . Brimonidine Tartrate     Objective:   Ht 5\' 2"  (1.575 m)   Wt 195 lb (88.5 kg)   BMI 35.67 kg/m   AAOx3, NAD NCAT, EOMI No obvious CN deficits Coloring WNL Pt is able to speak clearly, coherently without shortness of breath or increased work of breathing.  Thought process is linear.  Mood is appropriate.   Assessment and Plan:   Hyperlipidemia- chronic problem.  Tolerating statin w/o difficulty.  Check labs.  Adjust meds prn   Hypothyroid- chronic problem, currently asymptomatic.  Check labs.  Adjust meds prn   Obesity- pt is down 3 lbs since last visit.  Applauded her efforts at healthy diet and encouraged regular exercise.  Will follow.  Annye Asa, MD 05/09/2019

## 2019-05-12 ENCOUNTER — Ambulatory Visit: Payer: Medicare Other | Admitting: Family Medicine

## 2019-05-12 ENCOUNTER — Other Ambulatory Visit: Payer: Self-pay

## 2019-05-12 ENCOUNTER — Ambulatory Visit (INDEPENDENT_AMBULATORY_CARE_PROVIDER_SITE_OTHER): Payer: Medicare Other

## 2019-05-12 DIAGNOSIS — E669 Obesity, unspecified: Secondary | ICD-10-CM

## 2019-05-12 DIAGNOSIS — E785 Hyperlipidemia, unspecified: Secondary | ICD-10-CM

## 2019-05-12 DIAGNOSIS — E038 Other specified hypothyroidism: Secondary | ICD-10-CM

## 2019-05-12 LAB — LIPID PANEL
Cholesterol: 183 mg/dL (ref 0–200)
HDL: 44.4 mg/dL (ref >39.00)
LDL Cholesterol: 110 mg/dL — ABNORMAL HIGH (ref 0–99)
NonHDL: 138.76
Total CHOL/HDL Ratio: 4
Triglycerides: 142 mg/dL (ref 0.0–149.0)
VLDL: 28.4 mg/dL (ref 0.0–40.0)

## 2019-05-12 LAB — HEPATIC FUNCTION PANEL
ALT: 15 U/L (ref 0–35)
AST: 15 U/L (ref 0–37)
Albumin: 4.1 g/dL (ref 3.5–5.2)
Alkaline Phosphatase: 82 U/L (ref 39–117)
Bilirubin, Direct: 0.2 mg/dL (ref 0.0–0.3)
Total Bilirubin: 1 mg/dL (ref 0.2–1.2)
Total Protein: 6.1 g/dL (ref 6.0–8.3)

## 2019-05-12 LAB — CBC WITH DIFFERENTIAL/PLATELET
Basophils Absolute: 0.2 10*3/uL — ABNORMAL HIGH (ref 0.0–0.1)
Basophils Relative: 3.7 % — ABNORMAL HIGH (ref 0.0–3.0)
Eosinophils Absolute: 0.1 10*3/uL (ref 0.0–0.7)
Eosinophils Relative: 2.1 % (ref 0.0–5.0)
HCT: 43.9 % (ref 36.0–46.0)
Hemoglobin: 14.8 g/dL (ref 12.0–15.0)
Lymphocytes Relative: 34.6 % (ref 12.0–46.0)
Lymphs Abs: 1.9 10*3/uL (ref 0.7–4.0)
MCHC: 33.7 g/dL (ref 30.0–36.0)
MCV: 87.5 fl (ref 78.0–100.0)
Monocytes Absolute: 0.5 10*3/uL (ref 0.1–1.0)
Monocytes Relative: 8.2 % (ref 3.0–12.0)
Neutro Abs: 2.9 10*3/uL (ref 1.4–7.7)
Neutrophils Relative %: 51.4 % (ref 43.0–77.0)
Platelets: 197 10*3/uL (ref 150.0–400.0)
RBC: 5.01 Mil/uL (ref 3.87–5.11)
RDW: 12.7 % (ref 11.5–15.5)
WBC: 5.6 10*3/uL (ref 4.0–10.5)

## 2019-05-12 LAB — BASIC METABOLIC PANEL
BUN: 10 mg/dL (ref 6–23)
CO2: 30 mEq/L (ref 19–32)
Calcium: 9.3 mg/dL (ref 8.4–10.5)
Chloride: 104 mEq/L (ref 96–112)
Creatinine, Ser: 1 mg/dL (ref 0.40–1.20)
GFR: 55 mL/min — ABNORMAL LOW (ref >60.00)
Glucose, Bld: 93 mg/dL (ref 70–99)
Potassium: 4 mEq/L (ref 3.5–5.1)
Sodium: 144 mEq/L (ref 135–145)

## 2019-05-12 LAB — TSH: TSH: 3.54 u[IU]/mL (ref 0.35–4.50)

## 2019-05-16 ENCOUNTER — Encounter: Payer: Self-pay | Admitting: General Practice

## 2019-05-17 DIAGNOSIS — M5431 Sciatica, right side: Secondary | ICD-10-CM | POA: Diagnosis not present

## 2019-05-17 DIAGNOSIS — M545 Low back pain: Secondary | ICD-10-CM | POA: Diagnosis not present

## 2019-05-17 DIAGNOSIS — M25551 Pain in right hip: Secondary | ICD-10-CM | POA: Diagnosis not present

## 2019-05-17 DIAGNOSIS — M79604 Pain in right leg: Secondary | ICD-10-CM | POA: Diagnosis not present

## 2019-05-18 ENCOUNTER — Encounter: Payer: Self-pay | Admitting: Family Medicine

## 2019-05-18 ENCOUNTER — Ambulatory Visit (INDEPENDENT_AMBULATORY_CARE_PROVIDER_SITE_OTHER): Payer: Medicare Other | Admitting: Family Medicine

## 2019-05-18 ENCOUNTER — Other Ambulatory Visit: Payer: Self-pay

## 2019-05-18 DIAGNOSIS — M1712 Unilateral primary osteoarthritis, left knee: Secondary | ICD-10-CM

## 2019-05-18 DIAGNOSIS — M1711 Unilateral primary osteoarthritis, right knee: Secondary | ICD-10-CM | POA: Diagnosis not present

## 2019-05-18 NOTE — Assessment & Plan Note (Signed)
Patient given viscosupplementation.  Hopefully this will be beneficial.  This is the second 1 is here hoping that it will hold out longer.  Discussed which activities to do which was to avoid.  Patient is to increase activity slowly over the course of next several weeks.  Follow-up again in 4 to 8 weeks.

## 2019-05-18 NOTE — Progress Notes (Signed)
[name] [place], [place] [address] Phone: [phone] Subjective:   I Kandace [name] am serving as a Education administrator for Dr. Hulan Saas.  I'm seeing this patient by the request  of:    This visit occurred during the SARS-CoV-2 public health emergency.  Safety protocols were in place, including screening questions prior to the visit, additional usage of staff PPE, and extensive cleaning of exam room while observing appropriate contact time as indicated for disinfecting solutions.     CC: Left knee pain follow-up  [id number]   02/14/2019 Patient given another injection today and tolerated the procedure well.  Discussed icing regimen and home exercises.  Discussed which activities to doing which wants to avoid.  Due to the instability we did discuss the possibility of custom bracing which patient declined.  Encourage weight loss.  Patient will be due for Visco supplementation again in December.  Follow-up with me again in 4 to 8 weeks  05/18/2019 Lynn [name] is a 68 y.o. female coming in with complaint of left knee pain. Went to PT yesterday. Patient is doing well knee is a little sore from PT. Visco injection.  Patient had responded somewhat to it previously.  Looking to have some more relief again.  Patient states that the steroid is not helping as much.  Denies any numbness or tingling.    Past Medical History:  Diagnosis Date  . Allergy   . Arthritis    left knee  . Basal cell carcinoma of skin 10/14/1994   Right breast  . Basal cell carcinoma of skin 12/07/2011   Left inner elbow - TX p BX  . Hyperlipidemia   . Hypothyroidism   . Squamous cell carcinoma of skin 12/07/2011   Left lower leg - TX p BX  . Squamous cell carcinoma of skin 02/02/2017   Left anterior thigh - CX3 + 5FU   Past Surgical History:  Procedure Laterality Date  . c-section 1983    . COLONOSCOPY    . KNEE ARTHROSCOPY    . PARTIAL HYSTERECTOMY    . POLYPECTOMY     . TONSILLECTOMY    . TUBAL LIGATION     Social History   Socioeconomic History  . Marital status: Widowed    Spouse name: Not on file  . Number of children: Not on file  . Years of education: Not on file  . Highest education level: Not on file  Occupational History  . Not on file  Tobacco Use  . Smoking status: Never Smoker  . Smokeless tobacco: Never Used  Substance and Sexual Activity  . Alcohol use: No  . Drug use: No  . Sexual activity: Not on file  Other Topics Concern  . Not on file  Social History Narrative  . Not on file   Social Determinants of Health   Financial Resource Strain:   . Difficulty of Paying Living Expenses: Not on file  Food Insecurity:   . Worried About Charity fundraiser in the Last Year: Not on file  . Ran Out of Food in the Last Year: Not on file  Transportation Needs:   . Lack of Transportation (Medical): Not on file  . Lack of Transportation (Non-Medical): Not on file  Physical Activity:   . Days of Exercise per Week: Not on file  . Minutes of Exercise per Session: Not on file  Stress:   . Feeling of Stress : Not on file  Social  Connections:   . Frequency of Communication with Friends and Family: Not on file  . Frequency of Social Gatherings with Friends and Family: Not on file  . Attends Religious Services: Not on file  . Active Member of Clubs or Organizations: Not on file  . Attends Archivist Meetings: Not on file  . Marital Status: Not on file   Allergies  Allergen Reactions  . Sulfa Antibiotics Nausea Only  . Brimonidine Tartrate    Family History  Problem Relation Age of Onset  . Colon cancer Father   . Cancer Father        skin  . Cancer Paternal Grandfather        skin  . Colon polyps Sister   . Colon polyps Sister   . Colon polyps Sister   . Colon polyps Sister   . Pancreatic cancer Neg Hx   . Stomach cancer Neg Hx   . Esophageal cancer Neg Hx   . Rectal cancer Neg Hx     Current Outpatient  Medications (Endocrine & Metabolic):  .  levothyroxine (SYNTHROID) 100 MCG tablet, TAKE 1 TABLET BY MOUTH EVERY DAY  Current Outpatient Medications (Cardiovascular):  .  atorvastatin (LIPITOR) 40 MG tablet, TAKE 1 TABLET BY MOUTH EVERY DAY .  furosemide (LASIX) 40 MG tablet, TAKE 1 TABLET BY MOUTH EVERY DAY  Current Outpatient Medications (Respiratory):  .  fluticasone (FLONASE) 50 MCG/ACT nasal spray, INSTILL 2 SPRAYS IN EACH NOSTRIL ONCE A DAY .  promethazine (PHENERGAN) 25 MG tablet, Take 1 tablet (25 mg total) by mouth every 6 (six) hours as needed for nausea or vomiting.  Current Outpatient Medications (Analgesics):  .  meloxicam (MOBIC) 15 MG tablet, TAKE 1 TABLET BY MOUTH EVERY DAY   Current Outpatient Medications (Other):  .  diclofenac sodium (VOLTAREN) 1 % GEL, Apply topically to affected area qid .  gabapentin (NEURONTIN) 300 MG capsule, PLEASE SEE ATTACHED FOR DETAILED DIRECTIONS .  MELATONIN PO, Take 1 tablet by mouth at bedtime as needed. .  Multiple Vitamin (MULTIVITAMIN) tablet, Take 1 tablet by mouth daily. .  Probiotic Product (PROBIOTIC-10) CHEW, Chew by mouth.    Past medical history, social, surgical and family history all reviewed in electronic medical record.  No pertanent information unless stated regarding to the chief complaint.   Review of Systems:  No headache, visual changes, nausea, vomiting, diarrhea, constipation, dizziness, abdominal pain, skin rash, fevers, chills, night sweats, weight loss, swollen lymph nodes, body aches, joint swelling, muscle aches, chest pain, shortness of breath, mood changes.   Objective  Blood pressure (!) 144/100, pulse 89, height 5\' 2"  (1.575 m), weight 198 lb (89.8 kg), SpO2 96 %. Systems examined below as of    General: No apparent distress alert and oriented x3 mood and affect normal, dressed appropriately.  HEENT: Pupils equal, extraocular movements intact  Respiratory: Patient's speak in full sentences and does not  appear short of breath  Cardiovascular: No lower extremity edema, non tender, no erythema  Skin: Warm dry intact with no signs of infection or rash on extremities or on axial skeleton.  Abdomen: Soft nontender  Neuro: Cranial nerves II through XII are intact, neurovascularly intact in all extremities with 2+ DTRs and 2+ pulses.  Lymph: No lymphadenopathy of posterior or anterior cervical chain or axillae bilaterally.  Gait antalgic MSK:  tender with full range of motion and good stability and symmetric strength and tone of shoulders, elbows, wrist, hip, and ankles bilaterally.  Knee: Left valgus  deformity noted.  Abnormal thigh to calf ratio.  Tender to palpation over medial and PF joint line.  ROM full in flexion and extension and lower leg rotation. instability with valgus force.  painful patellar compression. Patellar glide with moderate crepitus. Patellar and quadriceps tendons unremarkable. Hamstring and quadriceps strength is normal. Contralateral knee shows mild arthritis   After informed written and verbal consent, patient was seated on exam table. Right knee was prepped with alcohol swab and utilizing anterolateral approach, patient's right knee space was injected with 22 mg/mL (sodium hyaluronate) in a prefilled syringe was injected easily into the knee through a 22-gauge needle..Patient tolerated the procedure well without immediate complications.   Impression and Recommendations:     This case required medical decision making of moderate complexity. The above documentation has been reviewed and is accurate and complete Lyndal Pulley, DO       Note: This dictation was prepared with Dragon dictation along with smaller phrase technology. Any transcriptional errors that result from this process are unintentional.

## 2019-05-18 NOTE — Patient Instructions (Signed)
  [address]st floor [place], [place] [address] Phone [phone]  [place] today Happy Holidays! See me again in 4-6 weeks

## 2019-05-26 ENCOUNTER — Other Ambulatory Visit: Payer: Self-pay | Admitting: Family Medicine

## 2019-06-08 DIAGNOSIS — M545 Low back pain: Secondary | ICD-10-CM | POA: Diagnosis not present

## 2019-06-08 DIAGNOSIS — M25551 Pain in right hip: Secondary | ICD-10-CM | POA: Diagnosis not present

## 2019-06-08 DIAGNOSIS — M79604 Pain in right leg: Secondary | ICD-10-CM | POA: Diagnosis not present

## 2019-06-08 DIAGNOSIS — M5431 Sciatica, right side: Secondary | ICD-10-CM | POA: Diagnosis not present

## 2019-06-19 DIAGNOSIS — M79604 Pain in right leg: Secondary | ICD-10-CM | POA: Diagnosis not present

## 2019-06-19 DIAGNOSIS — M5431 Sciatica, right side: Secondary | ICD-10-CM | POA: Diagnosis not present

## 2019-06-19 DIAGNOSIS — M25551 Pain in right hip: Secondary | ICD-10-CM | POA: Diagnosis not present

## 2019-06-19 DIAGNOSIS — M545 Low back pain: Secondary | ICD-10-CM | POA: Diagnosis not present

## 2019-06-22 ENCOUNTER — Other Ambulatory Visit: Payer: Self-pay | Admitting: Family Medicine

## 2019-06-22 ENCOUNTER — Ambulatory Visit: Payer: Medicare Other | Admitting: Family Medicine

## 2019-06-22 NOTE — Progress Notes (Deleted)
[place] Phone: [phone] Subjective:    I'm seeing this patient by the request  of:    CC:   [id number]  Lynn [name] is a 69 y.o. female coming in with complaint of ***  Onset-  Location Duration-  Character- Aggravating factors- Reliving factors-  Therapies tried-  Severity-     Past Medical History:  Diagnosis Date  . Allergy   . Arthritis    left knee  . Basal cell carcinoma of skin 10/14/1994   Right breast  . Basal cell carcinoma of skin 12/07/2011   Left inner elbow - TX p BX  . Hyperlipidemia   . Hypothyroidism   . Squamous cell carcinoma of skin 12/07/2011   Left lower leg - TX p BX  . Squamous cell carcinoma of skin 02/02/2017   Left anterior thigh - CX3 + 5FU   Past Surgical History:  Procedure Laterality Date  . c-section 1983    . COLONOSCOPY    . KNEE ARTHROSCOPY    . PARTIAL HYSTERECTOMY    . POLYPECTOMY    . TONSILLECTOMY    . TUBAL LIGATION     Social History   Socioeconomic History  . Marital status: Widowed    Spouse name: Not on file  . Number of children: Not on file  . Years of education: Not on file  . Highest education level: Not on file  Occupational History  . Not on file  Tobacco Use  . Smoking status: Never Smoker  . Smokeless tobacco: Never Used  Substance and Sexual Activity  . Alcohol use: No  . Drug use: No  . Sexual activity: Not on file  Other Topics Concern  . Not on file  Social History Narrative  . Not on file   Social Determinants of Health   Financial Resource Strain:   . Difficulty of Paying Living Expenses: Not on file  Food Insecurity:   . Worried About Charity fundraiser in the Last Year: Not on file  . Ran Out of Food in the Last Year: Not on file  Transportation Needs:   . Lack of Transportation (Medical): Not on file  . Lack of Transportation (Non-Medical): Not on file  Physical Activity:   . Days of Exercise  per Week: Not on file  . Minutes of Exercise per Session: Not on file  Stress:   . Feeling of Stress : Not on file  Social Connections:   . Frequency of Communication with Friends and Family: Not on file  . Frequency of Social Gatherings with Friends and Family: Not on file  . Attends Religious Services: Not on file  . Active Member of Clubs or Organizations: Not on file  . Attends Archivist Meetings: Not on file  . Marital Status: Not on file   Allergies  Allergen Reactions  . Sulfa Antibiotics Nausea Only  . Brimonidine Tartrate    Family History  Problem Relation Age of Onset  . Colon cancer Father   . Cancer Father        skin  . Cancer Paternal Grandfather        skin  . Colon polyps Sister   . Colon polyps Sister   . Colon polyps Sister   . Colon polyps Sister   . Pancreatic cancer Neg Hx   . Stomach cancer Neg Hx   . Esophageal cancer Neg Hx   . Rectal cancer  Neg Hx     Current Outpatient Medications (Endocrine & Metabolic):  .  levothyroxine (SYNTHROID) 100 MCG tablet, TAKE 1 TABLET BY MOUTH EVERY DAY  Current Outpatient Medications (Cardiovascular):  .  atorvastatin (LIPITOR) 40 MG tablet, TAKE 1 TABLET BY MOUTH EVERY DAY .  furosemide (LASIX) 40 MG tablet, TAKE 1 TABLET BY MOUTH EVERY DAY  Current Outpatient Medications (Respiratory):  .  fluticasone (FLONASE) 50 MCG/ACT nasal spray, INSTILL 2 SPRAYS IN EACH NOSTRIL ONCE A DAY .  promethazine (PHENERGAN) 25 MG tablet, Take 1 tablet (25 mg total) by mouth every 6 (six) hours as needed for nausea or vomiting.  Current Outpatient Medications (Analgesics):  .  meloxicam (MOBIC) 15 MG tablet, TAKE 1 TABLET BY MOUTH EVERY DAY   Current Outpatient Medications (Other):  .  diclofenac sodium (VOLTAREN) 1 % GEL, Apply topically to affected area qid .  gabapentin (NEURONTIN) 300 MG capsule, PLEASE SEE ATTACHED FOR DETAILED DIRECTIONS .  MELATONIN PO, Take 1 tablet by mouth at bedtime as needed. .   Multiple Vitamin (MULTIVITAMIN) tablet, Take 1 tablet by mouth daily. .  Probiotic Product (PROBIOTIC-10) CHEW, Chew by mouth.    Past medical history, social, surgical and family history all reviewed in electronic medical record.  No pertanent information unless stated regarding to the chief complaint.   Review of Systems:  No headache, visual changes, nausea, vomiting, diarrhea, constipation, dizziness, abdominal pain, skin rash, fevers, chills, night sweats, weight loss, swollen lymph nodes, body aches, joint swelling, muscle aches, chest pain, shortness of breath, mood changes.   Objective  There were no vitals taken for this visit. Systems examined below as of    General: No apparent distress alert and oriented x3 mood and affect normal, dressed appropriately.  HEENT: Pupils equal, extraocular movements intact  Respiratory: Patient's speak in full sentences and does not appear short of breath  Cardiovascular: No lower extremity edema, non tender, no erythema  Skin: Warm dry intact with no signs of infection or rash on extremities or on axial skeleton.  Abdomen: Soft nontender  Neuro: Cranial nerves II through XII are intact, neurovascularly intact in all extremities with 2+ DTRs and 2+ pulses.  Lymph: No lymphadenopathy of posterior or anterior cervical chain or axillae bilaterally.  Gait normal with good balance and coordination.  MSK:  Non tender with full range of motion and good stability and symmetric strength and tone of shoulders, elbows, wrist, hip, knee and ankles bilaterally.     Impression and Recommendations:     This case required medical decision making of moderate complexity. The above documentation has been reviewed and is accurate and complete Lyndal Pulley, DO       Note: This dictation was prepared with Dragon dictation along with smaller phrase technology. Any transcriptional errors that result from this process are unintentional.

## 2019-06-23 ENCOUNTER — Ambulatory Visit (INDEPENDENT_AMBULATORY_CARE_PROVIDER_SITE_OTHER): Payer: Medicare Other | Admitting: Family Medicine

## 2019-06-23 ENCOUNTER — Encounter: Payer: Self-pay | Admitting: Family Medicine

## 2019-06-23 ENCOUNTER — Other Ambulatory Visit: Payer: Self-pay

## 2019-06-23 VITALS — Temp 97.1°F

## 2019-06-23 DIAGNOSIS — B373 Candidiasis of vulva and vagina: Secondary | ICD-10-CM | POA: Diagnosis not present

## 2019-06-23 DIAGNOSIS — B9689 Other specified bacterial agents as the cause of diseases classified elsewhere: Secondary | ICD-10-CM

## 2019-06-23 DIAGNOSIS — J019 Acute sinusitis, unspecified: Secondary | ICD-10-CM

## 2019-06-23 DIAGNOSIS — B3731 Acute candidiasis of vulva and vagina: Secondary | ICD-10-CM

## 2019-06-23 MED ORDER — AMOXICILLIN 875 MG PO TABS: 875.0000 mg | ORAL_TABLET | Freq: Two times a day (BID) | ORAL | 0 refills | Status: DC

## 2019-06-23 MED ORDER — FLUCONAZOLE 150 MG PO TABS: 150.0000 mg | ORAL_TABLET | Freq: Once | ORAL | 0 refills | Status: AC

## 2019-06-23 NOTE — Progress Notes (Signed)
Virtual Visit via Video   I connected with patient on 06/23/19 at  4:00 PM EST by a video enabled telemedicine application and verified that I am speaking with the correct person using two identifiers.  Location patient: Home Location provider: Acupuncturist, Office Persons participating in the virtual visit: Patient, Provider, Sharkey (Jess B)  I discussed the limitations of evaluation and management by telemedicine and the availability of in person appointments. The patient expressed understanding and agreed to proceed.  Subjective:   HPI:   URI- 'i've been waking up every morning waking up w/ a pretty bad sinus headache'.  Improves as day goes on and after taking Mucinex and Tylenol.  Continues to use Flonase.  No fevers.  No changes to taste or smell.  No body aches.  HA will remain gone until next AM.  sxs started this week.  No known sick contacts.  No cough, no SOB.  + facial pain- forehead and around eyes.  No tooth pain.  Some mild ear discomfort.  No dizziness, no N/V.  Feels like previous sinus infections.  ROS:   See pertinent positives and negatives per HPI.  Patient Active Problem List   Diagnosis Date Noted  . Pain of right hip joint 05/21/2018  . Primary osteoarthritis of left knee 10/18/2017  . Physical exam 04/22/2016  . Hypothyroidism 10/24/2015  . Hyperlipidemia 10/24/2015  . Obesity (BMI 35.0-39.9 without comorbidity) 10/24/2015    Social History   Tobacco Use  . Smoking status: Never Smoker  . Smokeless tobacco: Never Used  Substance Use Topics  . Alcohol use: No    Current Outpatient Medications:  .  diclofenac sodium (VOLTAREN) 1 % GEL, Apply topically to affected area qid, Disp: 112 g, Rfl: 2 .  fluticasone (FLONASE) 50 MCG/ACT nasal spray, INSTILL 2 SPRAYS IN EACH NOSTRIL ONCE A DAY, Disp: 48 mL, Rfl: 1 .  furosemide (LASIX) 40 MG tablet, TAKE 1 TABLET BY MOUTH EVERY DAY, Disp: 90 tablet, Rfl: 1 .  gabapentin (NEURONTIN) 300 MG capsule,  PLEASE SEE ATTACHED FOR DETAILED DIRECTIONS, Disp: 270 capsule, Rfl: 1 .  levothyroxine (SYNTHROID) 100 MCG tablet, TAKE 1 TABLET BY MOUTH EVERY DAY, Disp: 90 tablet, Rfl: 1 .  MELATONIN PO, Take 1 tablet by mouth at bedtime as needed., Disp: , Rfl:  .  meloxicam (MOBIC) 15 MG tablet, TAKE 1 TABLET BY MOUTH EVERY DAY, Disp: 30 tablet, Rfl: 0 .  Multiple Vitamin (MULTIVITAMIN) tablet, Take 1 tablet by mouth daily., Disp: , Rfl:  .  Probiotic Product (PROBIOTIC-10) CHEW, Chew by mouth., Disp: , Rfl:  .  promethazine (PHENERGAN) 25 MG tablet, Take 1 tablet (25 mg total) by mouth every 6 (six) hours as needed for nausea or vomiting., Disp: 30 tablet, Rfl: 2 .  atorvastatin (LIPITOR) 40 MG tablet, TAKE 1 TABLET BY MOUTH EVERY DAY (Patient not taking: Reported on 06/23/2019), Disp: 90 tablet, Rfl: 1  Allergies  Allergen Reactions  . Sulfa Antibiotics Nausea Only  . Brimonidine Tartrate     Objective:   Temp (!) 97.1 F (36.2 C) (Oral)  AAOx3, NAD NCAT, EOMI No obvious CN deficits Coloring WNL Pt is able to speak clearly, coherently without shortness of breath or increased work of breathing.  Thought process is linear.  Mood is appropriate.   Assessment and Plan:   Bacterial sinusitis- pt reports this feels similar to previous.  Start abx.  Pt reports she gets yeast w/ abx so will also send Diflucan.  Reviewed supportive  care and red flags that should prompt return.  Pt expressed understanding and is in agreement w/ plan.    Annye Asa, MD 06/23/2019

## 2019-06-23 NOTE — Progress Notes (Signed)
I have discussed the procedure for the virtual visit with the patient who has given consent to proceed with assessment and treatment.   Autry Droege N Deshauna Cayson, CMA      

## 2019-06-27 ENCOUNTER — Ambulatory Visit (INDEPENDENT_AMBULATORY_CARE_PROVIDER_SITE_OTHER): Payer: Medicare Other | Admitting: Family Medicine

## 2019-06-27 ENCOUNTER — Encounter: Payer: Self-pay | Admitting: Family Medicine

## 2019-06-27 ENCOUNTER — Other Ambulatory Visit: Payer: Self-pay

## 2019-06-27 DIAGNOSIS — M1712 Unilateral primary osteoarthritis, left knee: Secondary | ICD-10-CM

## 2019-06-27 NOTE — Assessment & Plan Note (Signed)
Improvement noted at this time.  Viscosupplementation given again today.  Patient has responded well to it and last one was December 10.  Can repeat steroid injection if necessary in the next 2 to 3 months otherwise follow-up as needed

## 2019-06-27 NOTE — Patient Instructions (Signed)
Ok to increase activity as tolerated See me again in 3 months

## 2019-06-27 NOTE — Progress Notes (Signed)
[facility] [address] [place] Phone: [phone] Subjective:   I [name] am serving as a Education administrator for Dr. Hulan Saas.  This visit occurred during the SARS-CoV-2 public health emergency.  Safety protocols were in place, including screening questions prior to the visit, additional usage of staff PPE, and extensive cleaning of exam room while observing appropriate contact time as indicated for disinfecting solutions.   I'm seeing this patient by the request  of:  [name], MD  CC: Knee pain follow-up  [id number]   05/18/2019 Patient given viscosupplementation.  Hopefully this will be beneficial.  This is the second 1 is here hoping that it will hold out longer.  Discussed which activities to do which was to avoid.  Patient is to increase activity slowly over the course of next several weeks.  Follow-up again in 4 to 8 weeks.  Update 06/27/2019 RAYNI HINDSMAN is a 69 y.o. female coming in with complaint of left knee pain. Patient states she is doing a lot better.  Patient was states 75 to 80% better at this time.  And also noticing maybe some mild decrease in instability.  Patient states able to do more activity with less swelling and less pain in the knee.     Past Medical History:  Diagnosis Date  . Allergy   . Arthritis    left knee  . Basal cell carcinoma of skin 10/14/1994   Right breast  . Basal cell carcinoma of skin 12/07/2011   Left inner elbow - TX p BX  . Hyperlipidemia   . Hypothyroidism   . Squamous cell carcinoma of skin 12/07/2011   Left lower leg - TX p BX  . Squamous cell carcinoma of skin 02/02/2017   Left anterior thigh - CX3 + 5FU   Past Surgical History:  Procedure Laterality Date  . c-section 1983    . COLONOSCOPY    . KNEE ARTHROSCOPY    . PARTIAL HYSTERECTOMY    . POLYPECTOMY    . TONSILLECTOMY    . TUBAL LIGATION     Social History   Socioeconomic History  . Marital status:  Widowed    Spouse name: Not on file  . Number of children: Not on file  . Years of education: Not on file  . Highest education level: Not on file  Occupational History  . Not on file  Tobacco Use  . Smoking status: Never Smoker  . Smokeless tobacco: Never Used  Substance and Sexual Activity  . Alcohol use: No  . Drug use: No  . Sexual activity: Not on file  Other Topics Concern  . Not on file  Social History Narrative  . Not on file   Social Determinants of Health   Financial Resource Strain:   . Difficulty of Paying Living Expenses: Not on file  Food Insecurity:   . Worried About Charity fundraiser in the Last Year: Not on file  . Ran Out of Food in the Last Year: Not on file  Transportation Needs:   . Lack of Transportation (Medical): Not on file  . Lack of Transportation (Non-Medical): Not on file  Physical Activity:   . Days of Exercise per Week: Not on file  . Minutes of Exercise per Session: Not on file  Stress:   . Feeling of Stress : Not on file  Social Connections:   . Frequency of Communication with Friends and Family: Not on file  .  Frequency of Social Gatherings with Friends and Family: Not on file  . Attends Religious Services: Not on file  . Active Member of Clubs or Organizations: Not on file  . Attends Archivist Meetings: Not on file  . Marital Status: Not on file   Allergies  Allergen Reactions  . Sulfa Antibiotics Nausea Only  . Brimonidine Tartrate    Family History  Problem Relation Age of Onset  . Colon cancer Father   . Cancer Father        skin  . Cancer Paternal Grandfather        skin  . Colon polyps Sister   . Colon polyps Sister   . Colon polyps Sister   . Colon polyps Sister   . Pancreatic cancer Neg Hx   . Stomach cancer Neg Hx   . Esophageal cancer Neg Hx   . Rectal cancer Neg Hx     Current Outpatient Medications (Endocrine & Metabolic):  .  levothyroxine (SYNTHROID) 100 MCG tablet, TAKE 1 TABLET BY MOUTH  EVERY DAY  Current Outpatient Medications (Cardiovascular):  .  atorvastatin (LIPITOR) 40 MG tablet, TAKE 1 TABLET BY MOUTH EVERY DAY .  furosemide (LASIX) 40 MG tablet, TAKE 1 TABLET BY MOUTH EVERY DAY  Current Outpatient Medications (Respiratory):  .  fluticasone (FLONASE) 50 MCG/ACT nasal spray, INSTILL 2 SPRAYS IN EACH NOSTRIL ONCE A DAY .  promethazine (PHENERGAN) 25 MG tablet, Take 1 tablet (25 mg total) by mouth every 6 (six) hours as needed for nausea or vomiting.  Current Outpatient Medications (Analgesics):  .  meloxicam (MOBIC) 15 MG tablet, TAKE 1 TABLET BY MOUTH EVERY DAY   Current Outpatient Medications (Other):  .  amoxicillin (AMOXIL) 875 MG tablet, Take 1 tablet (875 mg total) by mouth 2 (two) times daily. .  diclofenac sodium (VOLTAREN) 1 % GEL, Apply topically to affected area qid .  gabapentin (NEURONTIN) 300 MG capsule, PLEASE SEE ATTACHED FOR DETAILED DIRECTIONS .  MELATONIN PO, Take 1 tablet by mouth at bedtime as needed. .  Multiple Vitamin (MULTIVITAMIN) tablet, Take 1 tablet by mouth daily. .  Probiotic Product (PROBIOTIC-10) CHEW, Chew by mouth.    Past medical history, social, surgical and family history all reviewed in electronic medical record.  No pertanent information unless stated regarding to the chief complaint.   Review of Systems:  No headache, visual changes, nausea, vomiting, diarrhea, constipation, dizziness, abdominal pain, skin rash, fevers, chills, night sweats, weight loss, swollen lymph nodes, body aches, joint swelling, chest pain, shortness of breath, mood changes. POSITIVE muscle aches  Objective  Blood pressure (!) 146/90, pulse 92, height 5\' 2"  (1.575 m), weight 199 lb (90.3 kg), SpO2 97 %.   General: No apparent distress alert and oriented x3 mood and affect normal, dressed appropriately.  HEENT: Pupils equal, extraocular movements intact  Respiratory: Patient's speak in full sentences and does not appear short of breath    Cardiovascular: No lower extremity edema, non tender, no erythema  Skin: Warm dry intact with no signs of infection or rash on extremities or on axial skeleton.  Abdomen: Soft nontender  Neuro: Cranial nerves II through XII are intact, neurovascularly intact in all extremities with 2+ DTRs and 2+ pulses.  Lymph: No lymphadenopathy of posterior or anterior cervical chain or axillae bilaterally.  Gait antalgic MSK: Left knee does have arthritic changes.  Still some instability with valgus and varus force.  Abnormal thigh to calf ratio noted.  Lacks the last 2 degrees of  extension in the last 5 degrees of flexion neurovascular intact distally.    Impression and Recommendations:      The above documentation has been reviewed and is accurate and complete Lyndal Pulley, DO       Note: This dictation was prepared with Dragon dictation along with smaller phrase technology. Any transcriptional errors that result from this process are unintentional.

## 2019-06-29 ENCOUNTER — Other Ambulatory Visit: Payer: Self-pay | Admitting: Family Medicine

## 2019-07-14 DIAGNOSIS — Z23 Encounter for immunization: Secondary | ICD-10-CM | POA: Diagnosis not present

## 2019-07-17 ENCOUNTER — Other Ambulatory Visit: Payer: Self-pay | Admitting: Family Medicine

## 2019-07-17 NOTE — Telephone Encounter (Signed)
SHould pt be on this long term?

## 2019-07-22 ENCOUNTER — Other Ambulatory Visit: Payer: Self-pay | Admitting: Family Medicine

## 2019-08-11 ENCOUNTER — Other Ambulatory Visit: Payer: Self-pay | Admitting: Family Medicine

## 2019-08-12 DIAGNOSIS — Z23 Encounter for immunization: Secondary | ICD-10-CM | POA: Diagnosis not present

## 2019-08-15 ENCOUNTER — Ambulatory Visit (INDEPENDENT_AMBULATORY_CARE_PROVIDER_SITE_OTHER): Payer: Medicare Other

## 2019-08-15 ENCOUNTER — Encounter: Payer: Self-pay | Admitting: Family Medicine

## 2019-08-15 ENCOUNTER — Other Ambulatory Visit: Payer: Self-pay

## 2019-08-15 ENCOUNTER — Ambulatory Visit (INDEPENDENT_AMBULATORY_CARE_PROVIDER_SITE_OTHER): Payer: Medicare Other | Admitting: Family Medicine

## 2019-08-15 VITALS — BP 150/90 | HR 78 | Ht 62.0 in | Wt 200.0 lb

## 2019-08-15 DIAGNOSIS — M1712 Unilateral primary osteoarthritis, left knee: Secondary | ICD-10-CM

## 2019-08-15 DIAGNOSIS — M25561 Pain in right knee: Secondary | ICD-10-CM | POA: Diagnosis not present

## 2019-08-15 DIAGNOSIS — S83011A Lateral subluxation of right patella, initial encounter: Secondary | ICD-10-CM | POA: Diagnosis not present

## 2019-08-15 DIAGNOSIS — G8929 Other chronic pain: Secondary | ICD-10-CM

## 2019-08-15 NOTE — Assessment & Plan Note (Signed)
I believe the patient did have more of a subluxation noted.  Patient did have some swelling noted.  We discussed icing regimen and home exercise, discussed which activities to do which was to avoid heavy patient given a hinged brace to help with stability because patient's thigh to calf ratio would not allow for a Tru pull lite.  Injection to help with the swelling at the moment and likely the underlying arthritis.  X-rays pending.  Follow-up again in 4 to 8 weeks.

## 2019-08-15 NOTE — Assessment & Plan Note (Signed)
Stable at the moment.  No need for any other further injection at this time.  We will continue to monitor and reevaluate again in 4 weeks as well.

## 2019-08-15 NOTE — Patient Instructions (Addendum)
Xray today Injection today Hinge brace See me again in 4 weeks

## 2019-08-15 NOTE — Progress Notes (Signed)
[facility] [address] [place] Phone: [phone] Subjective:   I Lynn [name] am serving as a Education administrator for Dr. Hulan Saas.  This visit occurred during the SARS-CoV-2 public health emergency.  Safety protocols were in place, including screening questions prior to the visit, additional usage of staff PPE, and extensive cleaning of exam room while observing appropriate contact time as indicated for disinfecting solutions.   I'm seeing this patient by the request  of:  [name], MD  CC: Right knee pain  [id number]   06/27/2019 Improvement noted at this time.  Viscosupplementation given again today.  Patient has responded well to it and last one was December 10.  Can repeat steroid injection if necessary in the next 2 to 3 months otherwise follow-up as needed  Update 08/15/2019 Lynn [name] is a 69 y.o. female coming in with complaint of right knee pain. Patient states it popped this past Saturday. Lateral knee pain. States her muscles are tight. Pain radiates to her back. Some weakness and loss of ROM. Has been using ice, heat and voltaren gel. Sitting for long periods of time makes it worse.  Patient states that it slowly seems to be improving at the time.  Patient is concerned though that she continues to favor this knee that her other knee with the arthritic changes would likely start her become worse.    Past Medical History:  Diagnosis Date  . Allergy   . Arthritis    left knee  . Basal cell carcinoma of skin 10/14/1994   Right breast  . Basal cell carcinoma of skin 12/07/2011   Left inner elbow - TX p BX  . Hyperlipidemia   . Hypothyroidism   . Squamous cell carcinoma of skin 12/07/2011   Left lower leg - TX p BX  . Squamous cell carcinoma of skin 02/02/2017   Left anterior thigh - CX3 + 5FU   Past Surgical History:  Procedure Laterality Date  . c-section 1983    . COLONOSCOPY    . KNEE ARTHROSCOPY    .  PARTIAL HYSTERECTOMY    . POLYPECTOMY    . TONSILLECTOMY    . TUBAL LIGATION     Social History   Socioeconomic History  . Marital status: Widowed    Spouse name: Not on file  . Number of children: Not on file  . Years of education: Not on file  . Highest education level: Not on file  Occupational History  . Not on file  Tobacco Use  . Smoking status: Never Smoker  . Smokeless tobacco: Never Used  Substance and Sexual Activity  . Alcohol use: No  . Drug use: No  . Sexual activity: Not on file  Other Topics Concern  . Not on file  Social History Narrative  . Not on file   Social Determinants of Health   Financial Resource Strain:   . Difficulty of Paying Living Expenses: Not on file  Food Insecurity:   . Worried About Charity fundraiser in the Last Year: Not on file  . Ran Out of Food in the Last Year: Not on file  Transportation Needs:   . Lack of Transportation (Medical): Not on file  . Lack of Transportation (Non-Medical): Not on file  Physical Activity:   . Days of Exercise per Week: Not on file  . Minutes of Exercise per Session: Not on file  Stress:   . Feeling of Stress :  Not on file  Social Connections:   . Frequency of Communication with Friends and Family: Not on file  . Frequency of Social Gatherings with Friends and Family: Not on file  . Attends Religious Services: Not on file  . Active Member of Clubs or Organizations: Not on file  . Attends Archivist Meetings: Not on file  . Marital Status: Not on file   Allergies  Allergen Reactions  . Sulfa Antibiotics Nausea Only  . Brimonidine Tartrate    Family History  Problem Relation Age of Onset  . Colon cancer Father   . Cancer Father        skin  . Cancer Paternal Grandfather        skin  . Colon polyps Sister   . Colon polyps Sister   . Colon polyps Sister   . Colon polyps Sister   . Pancreatic cancer Neg Hx   . Stomach cancer Neg Hx   . Esophageal cancer Neg Hx   . Rectal  cancer Neg Hx     Current Outpatient Medications (Endocrine & Metabolic):  .  levothyroxine (SYNTHROID) 100 MCG tablet, TAKE 1 TABLET BY MOUTH EVERY DAY  Current Outpatient Medications (Cardiovascular):  .  atorvastatin (LIPITOR) 40 MG tablet, TAKE 1 TABLET BY MOUTH EVERY DAY .  furosemide (LASIX) 40 MG tablet, TAKE 1 TABLET BY MOUTH EVERY DAY  Current Outpatient Medications (Respiratory):  .  fluticasone (FLONASE) 50 MCG/ACT nasal spray, INSTILL 2 SPRAYS IN EACH NOSTRIL ONCE A DAY .  promethazine (PHENERGAN) 25 MG tablet, Take 1 tablet (25 mg total) by mouth every 6 (six) hours as needed for nausea or vomiting.  Current Outpatient Medications (Analgesics):  .  meloxicam (MOBIC) 15 MG tablet, TAKE 1 TABLET BY MOUTH EVERY DAY   Current Outpatient Medications (Other):  .  amoxicillin (AMOXIL) 875 MG tablet, Take 1 tablet (875 mg total) by mouth 2 (two) times daily. .  diclofenac sodium (VOLTAREN) 1 % GEL, Apply topically to affected area qid .  gabapentin (NEURONTIN) 300 MG capsule, TAKE 1 CAPSULE AT BEDTIME FOR 1 WEEK, THEN INCREASE TO 1 CAP TWICE A DAY FOR 1 WEEK, THEN 1 CAP 3 TIMES A DAY AS NEEDED .  MELATONIN PO, Take 1 tablet by mouth at bedtime as needed. .  Multiple Vitamin (MULTIVITAMIN) tablet, Take 1 tablet by mouth daily. .  Probiotic Product (PROBIOTIC-10) CHEW, Chew by mouth.   Reviewed prior external information including notes and imaging from  primary care provider As well as notes that were available from care everywhere and other healthcare systems.  Past medical history, social, surgical and family history all reviewed in electronic medical record.  No pertanent information unless stated regarding to the chief complaint.   Review of Systems:  No headache, visual changes, nausea, vomiting, diarrhea, constipation, dizziness, abdominal pain, skin rash, fevers, chills, night sweats, weight loss, swollen lymph nodes, body aches,  chest pain, shortness of breath, mood  changes. POSITIVE muscle aches, joint swelling  Objective  Blood pressure (!) 150/90, pulse 78, height 5\' 2"  (1.575 m), weight 200 lb (90.7 kg), SpO2 98 %.   General: No apparent distress alert and oriented x3 mood and affect normal, dressed appropriately.  Overweight HEENT: Pupils equal, extraocular movements intact  Respiratory: Patient's speak in full sentences and does not appear short of breath  Cardiovascular: Trace lower extremity edema, non tender, no erythema  Skin: Warm dry intact with no signs of infection or rash on extremities or on axial  skeleton.  Abdomen: Soft nontender  Neuro: Cranial nerves II through XII are intact, neurovascularly intact in all extremities with 2+ DTRs and 2+ pulses.  Lymph: No lymphadenopathy of posterior or anterior cervical chain or axillae bilaterally.  Gait antalgic noted Patient's right knee does have some mild swelling of the patellofemoral joints laterally.  Seems to be somewhat hard like a small hematoma.  Patient does have decent range of motion but lacks the last 10 degrees of flexion last 5 degrees of extension.  Patient's patella has severe apprehension significant crepitus and positive patellar grind.  Contralateral knee is not quite as tender as patient's baseline but still has instability noted when testing  After informed written and verbal consent, patient was seated on exam table. Right knee was prepped with alcohol swab and utilizing anterolateral approach, patient's right knee space was injected with 4:1  marcaine 0.5%: Kenalog 40mg /dL. Patient tolerated the procedure well without immediate complications.   Impression and Recommendations:     This case required medical decision making of moderate complexity. The above documentation has been reviewed and is accurate and complete Lyndal Pulley, DO       Note: This dictation was prepared with Dragon dictation along with smaller phrase technology. Any transcriptional errors that  result from this process are unintentional.

## 2019-08-16 ENCOUNTER — Encounter: Payer: Self-pay | Admitting: Family Medicine

## 2019-09-04 ENCOUNTER — Telehealth: Payer: Self-pay | Admitting: Family Medicine

## 2019-09-04 NOTE — Progress Notes (Signed)
  Chronic Care Management   Note  09/04/2019 Name: Lynn [name] MRN: [id number] DOB: 1950/08/13  Lynn [name] is a 69 y.o. year old female who is a primary care patient of Birdie Riddle, Aundra Millet, MD. I reached out to [name] by phone today in response to a referral sent by [name]'s PCP, [name], MD.   [name] was given information about Chronic Care Management services today including:  1. CCM service includes personalized support from designated clinical staff supervised by her physician, including individualized plan of care and coordination with other care providers 2. 24/7 contact phone numbers for assistance for urgent and routine care needs. 3. Service will only be billed when office clinical staff spend 20 minutes or more in a month to coordinate care. 4. Only one practitioner may furnish and bill the service in a calendar month. 5. The patient may stop CCM services at any time (effective at the end of the month) by phone call to the office staff.   Patient agreed to services and verbal consent obtained.   Follow up plan:   Earney Hamburg Upstream Scheduler

## 2019-09-12 ENCOUNTER — Other Ambulatory Visit: Payer: Self-pay

## 2019-09-12 ENCOUNTER — Encounter: Payer: Self-pay | Admitting: Family Medicine

## 2019-09-12 ENCOUNTER — Ambulatory Visit (INDEPENDENT_AMBULATORY_CARE_PROVIDER_SITE_OTHER): Payer: Medicare Other | Admitting: Family Medicine

## 2019-09-12 ENCOUNTER — Other Ambulatory Visit: Payer: Self-pay | Admitting: General Practice

## 2019-09-12 DIAGNOSIS — M170 Bilateral primary osteoarthritis of knee: Secondary | ICD-10-CM

## 2019-09-12 DIAGNOSIS — E785 Hyperlipidemia, unspecified: Secondary | ICD-10-CM

## 2019-09-12 DIAGNOSIS — E038 Other specified hypothyroidism: Secondary | ICD-10-CM

## 2019-09-12 NOTE — Progress Notes (Signed)
[facility] [place] Phone: [phone] Subjective:   Lynn [name], am serving as a scribe for Dr. Hulan Saas. This visit occurred during the SARS-CoV-2 public health emergency.  Safety protocols were in place, including screening questions prior to the visit, additional usage of staff PPE, and extensive cleaning of exam room while observing appropriate contact time as indicated for disinfecting solutions.   I'm seeing this patient by the request  of:  [name], MD  CC: Bilateral knee pain right greater than left  [id number]   08/15/2019 I believe the patient did have more of a subluxation noted.  Patient did have some swelling noted.  We discussed icing regimen and home exercise, discussed which activities to do which was to avoid heavy patient given a hinged brace to help with stability because patient's thigh to calf ratio would not allow for a Tru pull lite.  Injection to help with the swelling at the moment and likely the underlying arthritis.  X-rays pending.  Follow-up again in 4 to 8 weeks.  Stable at the moment.  [name] need for any other further injection at this time.  We will continue to monitor and reevaluate again in 4 weeks as well.  Update 09/12/2019 Lynn [name] is a 69 y.o. female coming in with complaint of bilateral knee pain. Patient states that her right leg is improving but has achy pain throughout the day in both knees.  Patient had x-rays showing the patient did have severe arthritic changes bilaterally.  Patient states that she is doing relatively well.  Has been able to do some walking.  Patient is looking forward to the season where the pool will be open.     Past Medical History:  Diagnosis Date  . Allergy   . Arthritis    left knee  . Basal cell carcinoma of skin 10/14/1994   Right breast  . Basal cell carcinoma of skin 12/07/2011   Left inner elbow - TX p BX  . Hyperlipidemia     . Hypothyroidism   . Squamous cell carcinoma of skin 12/07/2011   Left lower leg - TX p BX  . Squamous cell carcinoma of skin 02/02/2017   Left anterior thigh - CX3 + 5FU   Past Surgical History:  Procedure Laterality Date  . c-section 1983    . COLONOSCOPY    . KNEE ARTHROSCOPY    . PARTIAL HYSTERECTOMY    . POLYPECTOMY    . TONSILLECTOMY    . TUBAL LIGATION     Social History   Socioeconomic History  . Marital status: Widowed    Spouse name: Not on file  . Number of children: Not on file  . Years of education: Not on file  . Highest education level: Not on file  Occupational History  . Not on file  Tobacco Use  . Smoking status: Never Smoker  . Smokeless tobacco: Never Used  Substance and Sexual Activity  . Alcohol use: [name]  . Drug use: [name]  . Sexual activity: Not on file  Other Topics Concern  . Not on file  Social History Narrative  . Not on file   Social Determinants of Health   Financial Resource Strain:   . Difficulty of Paying Living Expenses:   Food Insecurity:   . Worried About Charity fundraiser in the Last Year:   . Arboriculturist in the Last Year:   [employer]  Needs:   . Lack of Transportation (Medical):   Marland Kitchen Lack of Transportation (Non-Medical):   Physical Activity:   . Days of Exercise per Week:   . Minutes of Exercise per Session:   Stress:   . Feeling of Stress :   Social Connections:   . Frequency of Communication with Friends and Family:   . Frequency of Social Gatherings with Friends and Family:   . Attends Religious Services:   . Active Member of Clubs or Organizations:   . Attends Archivist Meetings:   Marland Kitchen Marital Status:    Allergies  Allergen Reactions  . Sulfa Antibiotics Nausea Only  . Brimonidine Tartrate    Family History  Problem Relation Age of Onset  . Colon cancer Father   . Cancer Father        skin  . Cancer Paternal Grandfather        skin  . Colon polyps Sister   . Colon polyps Sister   .  Colon polyps Sister   . Colon polyps Sister   . Pancreatic cancer Neg Hx   . Stomach cancer Neg Hx   . Esophageal cancer Neg Hx   . Rectal cancer Neg Hx     Current Outpatient Medications (Endocrine & Metabolic):  .  levothyroxine (SYNTHROID) 100 MCG tablet, TAKE 1 TABLET BY MOUTH EVERY DAY  Current Outpatient Medications (Cardiovascular):  .  atorvastatin (LIPITOR) 40 MG tablet, TAKE 1 TABLET BY MOUTH EVERY DAY .  furosemide (LASIX) 40 MG tablet, TAKE 1 TABLET BY MOUTH EVERY DAY  Current Outpatient Medications (Respiratory):  .  fluticasone (FLONASE) 50 MCG/ACT nasal spray, INSTILL 2 SPRAYS IN EACH NOSTRIL ONCE A DAY .  promethazine (PHENERGAN) 25 MG tablet, Take 1 tablet (25 mg total) by mouth every 6 (six) hours as needed for nausea or vomiting.  Current Outpatient Medications (Analgesics):  .  meloxicam (MOBIC) 15 MG tablet, TAKE 1 TABLET BY MOUTH EVERY DAY   Current Outpatient Medications (Other):  .  amoxicillin (AMOXIL) 875 MG tablet, Take 1 tablet (875 mg total) by mouth 2 (two) times daily. .  diclofenac sodium (VOLTAREN) 1 % GEL, Apply topically to affected area qid .  gabapentin (NEURONTIN) 300 MG capsule, TAKE 1 CAPSULE AT BEDTIME FOR 1 WEEK, THEN INCREASE TO 1 CAP TWICE A DAY FOR 1 WEEK, THEN 1 CAP 3 TIMES A DAY AS NEEDED .  MELATONIN PO, Take 1 tablet by mouth at bedtime as needed. .  Multiple Vitamin (MULTIVITAMIN) tablet, Take 1 tablet by mouth daily. .  Probiotic Product (PROBIOTIC-10) CHEW, Chew by mouth.   Reviewed prior external information including notes and imaging from  primary care provider As well as notes that were available from care everywhere and other healthcare systems.  Past medical history, social, surgical and family history all reviewed in electronic medical record.  [name] pertanent information unless stated regarding to the chief complaint.   Review of Systems:  [name] headache, visual changes, nausea, vomiting, diarrhea, constipation, dizziness,  abdominal pain, skin rash, fevers, chills, night sweats, weight loss, swollen lymph nodes, body aches, joint swelling, chest pain, shortness of breath, mood changes. POSITIVE muscle aches  Objective  Blood pressure 112/62, pulse 65, height 5\' 2"  (1.575 m), weight 198 lb (89.8 kg), SpO2 97 %.   General: [name] apparent distress alert and oriented x3 mood and affect normal, dressed appropriately.  Overweight HEENT: Pupils equal, extraocular movements intact  Respiratory: Patient's speak in full sentences and does not appear short  of breath  Cardiovascular: [name] lower extremity edema, non tender, [name] erythema  Neuro: Cranial nerves II through XII are intact, neurovascularly intact in all extremities with 2+ DTRs and 2+ pulses.  Gait antalgic MSK:  tender with full range of motion and good stability and symmetric strength and tone of shoulders, elbows, wrist, hip and ankles bilaterally.  Knee: Bilateral valgus deformity noted.  Abnormal thigh to calf ratio.  Tender to palpation over medial and PF joint line.  Mild lateral translation of the patella on the right ROM full in flexion and extension and lower leg rotation. instability with valgus force.  painful patellar compression. Patellar glide with moderate crepitus. Patellar and quadriceps tendons unremarkable. Hamstring and quadriceps strength is normal.    Impression and Recommendations:    . The above documentation has been reviewed and is accurate and complete Lyndal Pulley, DO       Note: This dictation was prepared with Dragon dictation along with smaller phrase technology. Any transcriptional errors that result from this process are unintentional.

## 2019-09-12 NOTE — Patient Instructions (Signed)
Stay active  Have fun on your trip See me again in 6-8 weeks if you need me

## 2019-09-12 NOTE — Assessment & Plan Note (Signed)
Patient is doing relatively well overall.  Multiple different injections.  Last injection was greater than 6 months ago.  Patient wants to hold at this time.  Will be traveling.  Follow-up with me more on an as-needed basis.

## 2019-09-18 ENCOUNTER — Telehealth: Payer: Medicare Other

## 2019-09-19 ENCOUNTER — Telehealth: Payer: Self-pay | Admitting: Family Medicine

## 2019-09-19 NOTE — Telephone Encounter (Signed)
Pt states that she was expecting an RX for Pennsaid to be called in but has not heard anything.

## 2019-09-19 NOTE — Telephone Encounter (Signed)
Left message for patient that insurance will not cover medication but that she could look for Voltaren Gel OTC. Office number provided to patient for any questions.

## 2019-09-19 NOTE — Progress Notes (Signed)
  Chronic Care Management   Outreach Note  09/19/2019 Name: Lynn [name] MRN: [id number] DOB: 1950-06-12  Referred by: [name], MD Reason for referral : No chief complaint on file.   An unsuccessful telephone outreach was attempted today. The patient was referred to the pharmacist for assistance with care management and care coordination.   Follow Up Plan:   Earney Hamburg Upstream Scheduler

## 2019-09-19 NOTE — Telephone Encounter (Signed)
Pt called back. Has used Voltaren in the past and was much happier with PennSaid. Do you have any idea the out of pocket costs as she might be willing to pay for it without her insurance.

## 2019-09-20 NOTE — Telephone Encounter (Signed)
Left message for patient in regards to medication costing between $2000-3000 and that we could send in rx if she wants but do recommend Voltaren as the alternative.

## 2019-09-25 ENCOUNTER — Other Ambulatory Visit: Payer: Self-pay | Admitting: Family Medicine

## 2019-09-26 ENCOUNTER — Telehealth: Payer: Self-pay | Admitting: Family Medicine

## 2019-09-26 ENCOUNTER — Ambulatory Visit: Payer: Medicare Other | Admitting: Family Medicine

## 2019-09-26 NOTE — Progress Notes (Signed)
  Chronic Care Management   Note  09/26/2019 Name: [name] MRN: [id number] DOB: 04/12/1951  Lynn [name] is a 69 y.o. year old female who is a primary care patient of Birdie Riddle, Aundra Millet, MD. I reached out to [name] by phone today in response to a referral sent by Ms. Tamsen Roers Henzler's PCP, [name], MD.   [name] was given information about Chronic Care Management services today including:  1. CCM service includes personalized support from designated clinical staff supervised by her physician, including individualized plan of care and coordination with other care providers 2. 24/7 contact phone numbers for assistance for urgent and routine care needs. 3. Service will only be billed when office clinical staff spend 20 minutes or more in a month to coordinate care. 4. Only one practitioner may furnish and bill the service in a calendar month. 5. The patient may stop CCM services at any time (effective at the end of the month) by phone call to the office staff.   Patient agreed to services and verbal consent obtained.   Follow up plan:   Earney Hamburg Upstream Scheduler

## 2019-09-26 NOTE — Telephone Encounter (Signed)
Left message for patient to schedule Annual Wellness Visit.  Please schedule with Nurse Health Advisor Victoria Britt, RN at  [facility]  

## 2019-10-18 ENCOUNTER — Other Ambulatory Visit: Payer: Self-pay | Admitting: General Practice

## 2019-10-18 DIAGNOSIS — E785 Hyperlipidemia, unspecified: Secondary | ICD-10-CM

## 2019-10-18 DIAGNOSIS — E669 Obesity, unspecified: Secondary | ICD-10-CM

## 2019-10-18 DIAGNOSIS — E038 Other specified hypothyroidism: Secondary | ICD-10-CM

## 2019-10-31 ENCOUNTER — Other Ambulatory Visit: Payer: Self-pay

## 2019-10-31 ENCOUNTER — Encounter: Payer: Self-pay | Admitting: Family Medicine

## 2019-10-31 ENCOUNTER — Ambulatory Visit (INDEPENDENT_AMBULATORY_CARE_PROVIDER_SITE_OTHER): Payer: Medicare Other | Admitting: Family Medicine

## 2019-10-31 DIAGNOSIS — M170 Bilateral primary osteoarthritis of knee: Secondary | ICD-10-CM | POA: Diagnosis not present

## 2019-10-31 NOTE — Progress Notes (Signed)
[name] Phone: [phone] Subjective:   [name], am serving as a scribe for Dr. Hulan Saas. This visit occurred during the SARS-CoV-2 public health emergency.  Safety protocols were in place, including screening questions prior to the visit, additional usage of staff PPE, and extensive cleaning of exam room while observing appropriate contact time as indicated for disinfecting solutions.   I'm seeing this patient by the request  of:  [name], MD  CC: Bilateral knee pain  [id number]   09/12/2019 Patient is doing relatively well overall.  Multiple different injections.  Last injection was greater than 6 months ago.  Patient wants to hold at this time.  Will be traveling.  Follow-up with me more on an as-needed basis.  Update 10/31/2019 Lynn [name] is a 69 y.o. female coming in with complaint of bilateral knee pain. Patient states that her pain has begun to increase. Left>Right.  Patient has had knee pain bilaterally for some time now.  Patient has known arthritic changes.  Has responded fairly well to steroid injections previously.  Starting to affect daily activities.  Knows that her aquatic swimming seems to be beneficial but has not been able to do it on a regular basis.     Past Medical History:  Diagnosis Date  . Allergy   . Arthritis    left knee  . Basal cell carcinoma of skin 10/14/1994   Right breast  . Basal cell carcinoma of skin 12/07/2011   Left inner elbow - TX p BX  . Hyperlipidemia   . Hypothyroidism   . Squamous cell carcinoma of skin 12/07/2011   Left lower leg - TX p BX  . Squamous cell carcinoma of skin 02/02/2017   Left anterior thigh - CX3 + 5FU   Past Surgical History:  Procedure Laterality Date  . c-section 1983    . COLONOSCOPY    . KNEE ARTHROSCOPY    . PARTIAL HYSTERECTOMY    . POLYPECTOMY    . TONSILLECTOMY    . TUBAL LIGATION     Social  History   Socioeconomic History  . Marital status: Widowed    Spouse name: Not on file  . Number of children: Not on file  . Years of education: Not on file  . Highest education level: Not on file  Occupational History  . Not on file  Tobacco Use  . Smoking status: Never Smoker  . Smokeless tobacco: Never Used  Substance and Sexual Activity  . Alcohol use: No  . Drug use: No  . Sexual activity: Not on file  Other Topics Concern  . Not on file  Social History Narrative  . Not on file   Social Determinants of Health   Financial Resource Strain:   . Difficulty of Paying Living Expenses:   Food Insecurity:   . Worried About Charity fundraiser in the Last Year:   . Arboriculturist in the Last Year:   Transportation Needs:   . Film/video editor (Medical):   Marland Kitchen Lack of Transportation (Non-Medical):   Physical Activity:   . Days of Exercise per Week:   . Minutes of Exercise per Session:   Stress:   . Feeling of Stress :   Social Connections:   . Frequency of Communication with Friends and Family:   . Frequency of Social Gatherings with Friends and Family:   . Attends Religious Services:   .  Active Member of Clubs or Organizations:   . Attends Archivist Meetings:   Marland Kitchen Marital Status:    Allergies  Allergen Reactions  . Sulfa Antibiotics Nausea Only  . Brimonidine Tartrate    Family History  Problem Relation Age of Onset  . Colon cancer Father   . Cancer Father        skin  . Cancer Paternal Grandfather        skin  . Colon polyps Sister   . Colon polyps Sister   . Colon polyps Sister   . Colon polyps Sister   . Pancreatic cancer Neg Hx   . Stomach cancer Neg Hx   . Esophageal cancer Neg Hx   . Rectal cancer Neg Hx     Current Outpatient Medications (Endocrine & Metabolic):  .  levothyroxine (SYNTHROID) 100 MCG tablet, TAKE 1 TABLET BY MOUTH EVERY DAY  Current Outpatient Medications (Cardiovascular):  .  atorvastatin (LIPITOR) 40 MG tablet,  TAKE 1 TABLET BY MOUTH EVERY DAY .  furosemide (LASIX) 40 MG tablet, TAKE 1 TABLET BY MOUTH EVERY DAY  Current Outpatient Medications (Respiratory):  .  fluticasone (FLONASE) 50 MCG/ACT nasal spray, INSTILL 2 SPRAYS IN EACH NOSTRIL ONCE A DAY .  promethazine (PHENERGAN) 25 MG tablet, Take 1 tablet (25 mg total) by mouth every 6 (six) hours as needed for nausea or vomiting.  Current Outpatient Medications (Analgesics):  .  meloxicam (MOBIC) 15 MG tablet, TAKE 1 TABLET BY MOUTH EVERY DAY   Current Outpatient Medications (Other):  .  amoxicillin (AMOXIL) 875 MG tablet, Take 1 tablet (875 mg total) by mouth 2 (two) times daily. .  diclofenac sodium (VOLTAREN) 1 % GEL, Apply topically to affected area qid .  gabapentin (NEURONTIN) 300 MG capsule, TAKE 1 CAPSULE AT BEDTIME FOR 1 WEEK, THEN INCREASE TO 1 CAP TWICE A DAY FOR 1 WEEK, THEN 1 CAP 3 TIMES A DAY AS NEEDED .  MELATONIN PO, Take 1 tablet by mouth at bedtime as needed. .  Multiple Vitamin (MULTIVITAMIN) tablet, Take 1 tablet by mouth daily. .  Probiotic Product (PROBIOTIC-10) CHEW, Chew by mouth.   Reviewed prior external information including notes and imaging from  primary care provider As well as notes that were available from care everywhere and other healthcare systems.  Past medical history, social, surgical and family history all reviewed in electronic medical record.  No pertanent information unless stated regarding to the chief complaint.   Review of Systems:  No headache, visual changes, nausea, vomiting, diarrhea, constipation, dizziness, abdominal pain, skin rash, fevers, chills, night sweats, weight loss, swollen lymph nodes, , chest pain, shortness of breath, mood changes. POSITIVE muscle aches, body aches, joint swelling  Objective  Blood pressure 108/78, pulse 86, height 5\' 2"  (1.575 m), weight 197 lb (89.4 kg), SpO2 97 %.   General: No apparent distress alert and oriented x3 mood and affect normal, dressed  appropriately.  Overweight HEENT: Pupils equal, extraocular movements intact  Respiratory: Patient's speak in full sentences and does not appear short of breath  Cardiovascular: Trace lower extremity edema, non tender, no erythema  Neuro: Cranial nerves II through XII are intact, neurovascularly intact in all extremities with 2+ DTRs and 2+ pulses.  Gait normal with good balance and coordination.  MSK:   Knee: Bilateral valgus deformity noted.  Abnormal thigh to calf ratio.  Tender to palpation over medial and PF joint line.  ROM full in flexion and extension and lower leg rotation. instability with  valgus force.  painful patellar compression. Patellar glide with moderate crepitus. Patellar and quadriceps tendons unremarkable. Hamstring and quadriceps strength is normal.  After informed written and verbal consent, patient was seated on exam table. Right knee was prepped with alcohol swab and utilizing anterolateral approach, patient's right knee space was injected with 4:1  marcaine 0.5%: Kenalog 40mg /dL. Patient tolerated the procedure well without immediate complications.  After informed written and verbal consent, patient was seated on exam table. Left knee was prepped with alcohol swab and utilizing anterolateral approach, patient's left knee space was injected with 4:1  marcaine 0.5%: Kenalog 40mg /dL. Patient tolerated the procedure well without immediate complications.   Impression and Recommendations:     This case required medical decision making of moderate complexity. The above documentation has been reviewed and is accurate and complete Lyndal Pulley, DO       Note: This dictation was prepared with Dragon dictation along with smaller phrase technology. Any transcriptional errors that result from this process are unintentional.

## 2019-10-31 NOTE — Patient Instructions (Addendum)
Injected both knees today °See me again in 3 months °

## 2019-10-31 NOTE — Assessment & Plan Note (Signed)
Chronic problem with worsening pain.  Injections given again today.  Discussed medication management including the gabapentin and the meloxicam.  Discussed icing regimen and home exercises, follow-up again in 8 to 10 weeks.  Patient feels the steroid injection seems to be the most beneficial at this time

## 2019-11-07 ENCOUNTER — Other Ambulatory Visit: Payer: Self-pay | Admitting: Family Medicine

## 2019-11-07 NOTE — Progress Notes (Signed)
Chronic Care Management Pharmacy  Name: Lynn [name]  MRN: [id number] DOB: September 30, 1950  Chief Complaint/ HPI  Lynn [name],  69 y.o. , female presents for their Initial CCM visit with the clinical pharmacist via telephone due to COVID-19 Pandemic.  PCP : [name], MD  Their chronic conditions include:  Encounter Diagnoses  Name Primary?  . Other specified hypothyroidism   . Hyperlipidemia, unspecified hyperlipidemia type   . Primary osteoarthritis of left knee     Office Visits:  06/23/2019 (PCP): Acute bacterial sinusitis, yeast vaginitis. Amox, fluconazole.   Consult Visit: 10/31/2019 (Dr. Tamala Julian, Sports Medicine): steroid injection both knees. Pain management with meloxicam and gabapentin.  Patient Active Problem List   Diagnosis Date Noted  . Lateral subluxation of right patella 08/15/2019  . Pain of right hip joint 05/21/2018  . Degenerative arthritis of knee, bilateral 10/18/2017  . Physical exam 04/22/2016  . Hypothyroidism 10/24/2015  . Hyperlipidemia 10/24/2015  . Obesity (BMI 35.0-39.9 without comorbidity) 10/24/2015   Social History   Socioeconomic History  . Marital status: Widowed    Spouse name: Not on file  . Number of children: Not on file  . Years of education: Not on file  . Highest education level: Not on file  Occupational History  . Not on file  Tobacco Use  . Smoking status: Never Smoker  . Smokeless tobacco: Never Used  Substance and Sexual Activity  . Alcohol use: No  . Drug use: No  . Sexual activity: Not on file  Other Topics Concern  . Not on file  Social History Narrative  . Not on file   Social Determinants of Health   Financial Resource Strain:   . Difficulty of Paying Living Expenses:   Food Insecurity: No Food Insecurity  . Worried About Charity fundraiser in the Last Year: Never true  . Ran Out of Food in the Last Year: Never true  Transportation Needs: No Transportation Needs  . Lack of Transportation  (Medical): No  . Lack of Transportation (Non-Medical): No  Physical Activity:   . Days of Exercise per Week:   . Minutes of Exercise per Session:   Stress:   . Feeling of Stress :   Social Connections:   . Frequency of Communication with Friends and Family:   . Frequency of Social Gatherings with Friends and Family:   . Attends Religious Services:   . Active Member of Clubs or Organizations:   . Attends Archivist Meetings:   Marland Kitchen Marital Status:    Family History  Problem Relation Age of Onset  . Colon cancer Father   . Cancer Father        skin  . Cancer Paternal Grandfather        skin  . Colon polyps Sister   . Colon polyps Sister   . Colon polyps Sister   . Colon polyps Sister   . Pancreatic cancer Neg Hx   . Stomach cancer Neg Hx   . Esophageal cancer Neg Hx   . Rectal cancer Neg Hx    Allergies  Allergen Reactions  . Sulfa Antibiotics Nausea Only  . Brimonidine Tartrate    Outpatient Encounter Medications as of 11/08/2019  Medication Sig  . atorvastatin (LIPITOR) 40 MG tablet TAKE 1 TABLET BY MOUTH EVERY DAY  . diclofenac sodium (VOLTAREN) 1 % GEL Apply topically to affected area qid  . fluticasone (FLONASE) 50 MCG/ACT nasal spray INSTILL 2 SPRAYS IN EACH NOSTRIL ONCE  A DAY  . furosemide (LASIX) 40 MG tablet TAKE 1 TABLET BY MOUTH EVERY DAY  . gabapentin (NEURONTIN) 300 MG capsule TAKE 1 CAPSULE AT BEDTIME FOR 1 WEEK, THEN INCREASE TO 1 CAP TWICE A DAY FOR 1 WEEK, THEN 1 CAP 3 TIMES A DAY AS NEEDED  . levothyroxine (SYNTHROID) 100 MCG tablet TAKE 1 TABLET BY MOUTH EVERY DAY  . MELATONIN PO Take 1 tablet by mouth at bedtime as needed.  . meloxicam (MOBIC) 15 MG tablet TAKE 1 TABLET BY MOUTH EVERY DAY  . Multiple Vitamin (MULTIVITAMIN) tablet Take 1 tablet by mouth daily.  . Probiotic Product (PROBIOTIC-10) CHEW Chew by mouth.  . promethazine (PHENERGAN) 25 MG tablet Take 1 tablet (25 mg total) by mouth every 6 (six) hours as needed for nausea or vomiting.    Marland Kitchen amoxicillin (AMOXIL) 875 MG tablet Take 1 tablet (875 mg total) by mouth 2 (two) times daily. (Patient not taking: Reported on 11/08/2019)   No facility-administered encounter medications on file as of 11/08/2019.   Current Diagnosis/Assessment: Goals Addressed            This Visit's Progress   . Patient Stated       CARE PLAN ENTRY  Current Barriers:  . Chronic Disease Management support, education, and care coordination needs related to Hyperlipidemia, Hypothyroidism, and Osteoarthritis   Hyperlipidemia . Pharmacist Clinical Goal(s): o Over the next 90 days, patient will work with PharmD and providers to achieve LDL goal < 100 . Current regimen:  o Atorvastatin 40 mg daily  . Interventions: o Continue current management  . Patient self care activities - Over the next 90 days, patient will: o Continue current management   Osteoarthritis  . Pharmacist Clinical Goal(s) o Over the next 90 days, patient will work with PharmD and providers to minimize arthritis related pain in knees . Current regimen:  . Gabapentin 300 mg three times daily as needed  . Meloxicam 15 mg daily  . Voltaren 1% gel four times daily as needed  . Interventions: o Continue current management . Patient self care activities - Over the next 90 days, patient will: o Continue current management  Hypothyroidism . Pharmacist Clinical Goal(s) o Over the next 90 days, patient will work with PharmD and providers to maintain goal TSH levels. . Current regimen:  o Levothyroxine 100 mcg daily . Interventions: o Take first thing in the morning, 30 minutes before food and other medications.  . Patient self care activities - Over the next 90 days, patient will: o Continue current management  Medication management . Pharmacist Clinical Goal(s): o Over the next 90 days, patient will work with PharmD and providers to maintain optimal medication adherence . Current pharmacy: [facility] . Interventions o Comprehensive medication review performed. o Continue current medication management strategy . Patient self care activities - Over the next 90 days, patient will: o Focus on medication adherence: Continue current management o Take medications as prescribed o Report any questions or concerns to PharmD and/or provider(s)  Initial goal documentation.       Hyperlipidemia   Lipid Panel     Component Value Date/Time   CHOL 183 05/12/2019 0823   TRIG 142.0 05/12/2019 0823   HDL 44.40 05/12/2019 0823   LDLCALC 110 (H) 05/12/2019 0823   LDLDIRECT 105.0 10/06/2017 1000    The 10-year ASCVD risk score Mikey Bussing DC Jr., et al., 2013) is: 6.3%   Values used to calculate the score:     Age:  46 years     Sex: Female     Is Non-Hispanic African American: No     Diabetic: No     Tobacco smoker: No     Systolic Blood Pressure: 123XX123 mmHg     Is BP treated: No     HDL Cholesterol: 44.4 mg/dL     Total Cholesterol: 183 mg/dL   LDL consistently near 100, last value 110 on 05/2019. Discussed diet and exercise at length. Example of recent diet: Breakfast - coffee and nutrigrain bar; lunch - slimfast shake, dinner - sandwich. Typically will snack at night - chocolate is go-to snack. Exercises on stationary bike several times per week. Occasionally will swim in daughter's pool. Walking is difficult due to ongoing issue of knee pain.  Patient has failed these meds in the past: Zocor 20-40 mg. Reports intermittent redness in face and is suspecting this is from atorvastatin. Mentions similar redness after steroid use in the past. Received steroid injection 8 days ago. Redness resolved 3-4 days ago. Pt encouraged to report any ongoing redness or symptoms of allergic reaction.   Denies any muscle or abdominal pain or n/v. Patient is currently controlled on the following medications:   Atorvastatin 40 mg daily  Plan  Continue current medications and control with diet and  exercise.   Hypothyroidism   Lab Results  Component Value Date/Time   TSH 3.54 05/12/2019 08:23 AM   TSH 1.67 10/28/2018 01:25 PM   TSH 3.03 04/27/2018 09:46 AM   Counseled on proper administration of levothyroxine and discussed relevant labs.  Patient is currently controlled on the following medications:  . Levothyroxine 100 mcg daily   Plan  Continue current medications  Osteopenia / Osteoporosis   Last DEXA Scan: 11/2017. Osteopenia.   10-year probability of major osteoporotic fracture: 11.8%  10-year probability of hip fracture: 2.2%  No results found for: VD25OH   Patient is not a candidate for pharmacologic treatment.   We discussed:  Recommend [phone] units of vitamin D daily. Recommend 1200 mg of calcium daily from dietary and supplemental sources.  Plan Recommend [phone] units of vitamin D daily.  Recommend 1200 mg of calcium daily from dietary and supplemental sources. Recommend follow-up vitamin d lab.   Bilateral knee pain / osteoarthritis   Steroid injection in both knees 10/31/2019. Reports significant relief from this. Patient is sensitive to somnolence from gabapentin - limits to 300 mg every night. Otherwise no side effects reported.  Patient is currently controlled on the following medications:  . Gabapentin 300 mg three times daily as needed  . Meloxicam 15 mg daily  . Voltaren 1% gel four times daily as needed   Plan   Continue current medications.   Vaccines   Reviewed and discussed patient's vaccination history.  Up to date on routine vaccines.   Immunization History  Administered Date(s) Administered  . Fluad Quad(high Dose 65+) 02/17/2019  . Influenza, High Dose Seasonal PF 03/07/2018  . Influenza, Seasonal, Injecte, Preservative Fre 03/08/2012, 03/13/2013, 03/15/2014, 03/19/2015  . Influenza,inj,Quad PF,6+ Mos 04/02/2016, 02/02/2017  . Pneumococcal Conjugate-13 10/24/2015  . Pneumococcal Polysaccharide-23 06/09/2017  . Td 07/25/2018   . Tdap 10/24/2011  . Zoster 10/24/2014  . Zoster Recombinat (Shingrix) 02/03/2018, 06/22/2018   Plan  No recommendation.  Medication Management   Receives prescription medications from:  [id number] - [place], [place] - [address] [place] [address] Phone: [phone] Fax: [phone]  Denies any issues with current medication management.   Plan  Continue current medication management strategy.  Follow up: 6 month phone visit. ______________ Visit Information SDOH (Social Determinants of Health) assessments performed: Yes.  Ms. Reigner was given information about Chronic Care Management services today including:  1. CCM service includes personalized support from designated clinical staff supervised by her physician, including individualized plan of care and coordination with other care providers 2. 24/7 contact phone numbers for assistance for urgent and routine care needs. 3. Standard insurance, coinsurance, copays and deductibles apply for chronic care management only during months in which we provide at least 20 minutes of these services. Most insurances cover these services at 100%, however patients may be responsible for any copay, coinsurance and/or deductible if applicable. This service may help you avoid the need for more expensive face-to-face services. 4. Only one practitioner may furnish and bill the service in a calendar month. 5. The patient may stop CCM services at any time (effective at the end of the month) by phone call to the office staff.  Patient agreed to services and verbal consent obtained.   [name], Pharm.D., BCGP Clinical Pharmacist [facility] [phone]

## 2019-11-08 ENCOUNTER — Telehealth: Payer: Self-pay

## 2019-11-08 ENCOUNTER — Ambulatory Visit: Payer: Medicare Other

## 2019-11-08 ENCOUNTER — Other Ambulatory Visit: Payer: Self-pay

## 2019-11-08 DIAGNOSIS — M1712 Unilateral primary osteoarthritis, left knee: Secondary | ICD-10-CM

## 2019-11-08 DIAGNOSIS — E038 Other specified hypothyroidism: Secondary | ICD-10-CM

## 2019-11-08 DIAGNOSIS — E785 Hyperlipidemia, unspecified: Secondary | ICD-10-CM

## 2019-11-08 NOTE — Patient Instructions (Addendum)
For osteopenia (bone thinning), it is recommended to take [phone] units of vitamin D daily and 1200 mg of calcium daily from dietary and supplemental sources. There are several calcium-vitamin D combination supplements available over the counter at the pharmacy that can be used for this.   Please call me at [phone] (direct line) with any questions - thank you!  - Lynn [name]., Clinical Pharmacist  Goals Addressed            This Visit's Progress   . Patient Stated       CARE PLAN ENTRY  Current Barriers:  . Chronic Disease Management support, education, and care coordination needs related to Hyperlipidemia, Hypothyroidism, and Osteoarthritis   Hyperlipidemia . Pharmacist Clinical Goal(s): o Over the next 90 days, patient will work with PharmD and providers to achieve LDL goal < 100 . Current regimen:  o Atorvastatin 40 mg daily  . Interventions: o Continue current management  . Patient self care activities - Over the next 90 days, patient will: o Continue current management   Osteoarthritis  . Pharmacist Clinical Goal(s) o Over the next 90 days, patient will work with PharmD and providers to minimize arthritis related pain in knees . Current regimen:  . Gabapentin 300 mg three times daily as needed  . Meloxicam 15 mg daily  . Voltaren 1% gel four times daily as needed  . Interventions: o Continue current management . Patient self care activities - Over the next 90 days, patient will: o Continue current management  Hypothyroidism . Pharmacist Clinical Goal(s) o Over the next 90 days, patient will work with PharmD and providers to maintain goal TSH levels. . Current regimen:  o Levothyroxine 100 mcg daily . Interventions: o Take first thing in the morning, 30 minutes before food and other medications.  . Patient self care activities - Over the next 90 days, patient will: o Continue current management  Medication management . Pharmacist Clinical Goal(s): o Over the  next 90 days, patient will work with PharmD and providers to maintain optimal medication adherence . Current pharmacy: [facility] . Interventions o Comprehensive medication review performed. o Continue current medication management strategy . Patient self care activities - Over the next 90 days, patient will: o Focus on medication adherence: Continue current management o Take medications as prescribed o Report any questions or concerns to PharmD and/or provider(s)  Initial goal documentation.      Lynn [name] was given information about Chronic Care Management services today including:  1. CCM service includes personalized support from designated clinical staff supervised by her physician, including individualized plan of care and coordination with other care providers 2. 24/7 contact phone numbers for assistance for urgent and routine care needs. 3. Standard insurance, coinsurance, copays and deductibles apply for chronic care management only during months in which we provide at least 20 minutes of these services. Most insurances cover these services at 100%, however patients may be responsible for any copay, coinsurance and/or deductible if applicable. This service may help you avoid the need for more expensive face-to-face services. 4. Only one practitioner may furnish and bill the service in a calendar month. 5. The patient may stop CCM services at any time (effective at the end of the month) by phone call to the office staff.  Patient agreed to services and verbal consent obtained.   The patient verbalized understanding of instructions provided today and agreed to receive a mailed copy of patient instruction and/or educational materials. Telephone follow up appointment with  pharmacy team member scheduled for: See next appointment with "Care Management Staff" under "What's Next" below.   Thank you!  Lynn [name], Pharm.D., BCGP Clinical Pharmacist [facility] [phone]  High Cholesterol  High cholesterol is a condition in which the blood has high levels of a white, waxy, fat-like substance (cholesterol). The human body needs small amounts of cholesterol. The liver makes all the cholesterol that the body needs. Extra (excess) cholesterol comes from the food that we eat. Cholesterol is carried from the liver by the blood through the blood vessels. If you have high cholesterol, deposits (plaques) may build up on the walls of your blood vessels (arteries). Plaques make the arteries narrower and stiffer. Cholesterol plaques increase your risk for heart attack and stroke. Work with your health care provider to keep your cholesterol levels in a healthy range. What increases the risk? This condition is more likely to develop in people who:  Eat foods that are high in animal fat (saturated fat) or cholesterol.  Are overweight.  Are not getting enough exercise.  Have a family history of high cholesterol. What are the signs or symptoms? There are no symptoms of this condition. How is this diagnosed? This condition may be diagnosed from the results of a blood test.  If you are older than age 39, your health care provider may check your cholesterol every 4-6 years.  You may be checked more often if you already have high cholesterol or other risk factors for heart disease. The blood test for cholesterol measures:  "Bad" cholesterol (LDL cholesterol). This is the main type of cholesterol that causes heart disease. The desired level for LDL is less than 100.  "Good" cholesterol (HDL cholesterol). This type helps to protect against heart disease by cleaning the arteries and carrying the LDL away. The desired level for HDL is 60 or higher.  Triglycerides. These are fats that the body can store or burn for energy. The desired number for triglycerides is lower than 150.  Total cholesterol. This is a measure of the total amount of cholesterol in  your blood, including LDL cholesterol, HDL cholesterol, and triglycerides. A healthy number is less than 200. How is this treated? This condition is treated with diet changes, lifestyle changes, and medicines. Diet changes  This may include eating more whole grains, fruits, vegetables, nuts, and fish.  This may also include cutting back on red meat and foods that have a lot of added sugar. Lifestyle changes  Changes may include getting at least 40 minutes of aerobic exercise 3 times a week. Aerobic exercises include walking, biking, and swimming. Aerobic exercise along with a healthy diet can help you maintain a healthy weight.  Changes may also include quitting smoking. Medicines  Medicines are usually given if diet and lifestyle changes have failed to reduce your cholesterol to healthy levels.  Your health care provider may prescribe a statin medicine. Statin medicines have been shown to reduce cholesterol, which can reduce the risk of heart disease. Follow these instructions at home: Eating and drinking If told by your health care provider:  Eat chicken (without skin), fish, veal, shellfish, ground [place] breast, and round or loin cuts of red meat.  Do not eat fried foods or fatty meats, such as hot dogs and salami.  Eat plenty of fruits, such as apples.  Eat plenty of vegetables, such as broccoli, potatoes, and carrots.  Eat beans, peas, and lentils.  Eat grains such as barley, rice,  couscous, and bulgur wheat.  Eat pasta without cream sauces.  Use skim or nonfat milk, and eat low-fat or nonfat yogurt and cheeses.  Do not eat or drink whole milk, cream, ice cream, egg yolks, or hard cheeses.  Do not eat stick margarine or tub margarines that contain trans fats (also called partially hydrogenated oils).  Do not eat saturated tropical oils, such as coconut oil and palm oil.  Do not eat cakes, cookies, crackers, or other baked goods that contain trans fats.  General  instructions  Exercise as directed by your health care provider. Increase your activity level with activities such as gardening, walking, and taking the stairs.  Take over-the-counter and prescription medicines only as told by your health care provider.  Do not use any products that contain nicotine or tobacco, such as cigarettes and e-cigarettes. If you need help quitting, ask your health care provider.  Keep all follow-up visits as told by your health care provider. This is important. Contact a health care provider if:  You are struggling to maintain a healthy diet or weight.  You need help to start on an exercise program.  You need help to stop smoking. Get help right away if:  You have chest pain.  You have trouble breathing. This information is not intended to replace advice given to you by your health care provider. Make sure you discuss any questions you have with your health care provider. Document Revised: 05/28/2017 Document Reviewed: 11/23/2015 Elsevier Patient Education  [place].

## 2019-11-14 ENCOUNTER — Encounter: Payer: Self-pay | Admitting: Physician Assistant

## 2019-11-14 ENCOUNTER — Other Ambulatory Visit: Payer: Self-pay

## 2019-11-14 ENCOUNTER — Telehealth (INDEPENDENT_AMBULATORY_CARE_PROVIDER_SITE_OTHER): Payer: Medicare Other | Admitting: Physician Assistant

## 2019-11-14 VITALS — Temp 97.5°F

## 2019-11-14 DIAGNOSIS — B9689 Other specified bacterial agents as the cause of diseases classified elsewhere: Secondary | ICD-10-CM

## 2019-11-14 DIAGNOSIS — J019 Acute sinusitis, unspecified: Secondary | ICD-10-CM

## 2019-11-14 MED ORDER — AMOXICILLIN-POT CLAVULANATE 875-125 MG PO TABS: 1.0000 | ORAL_TABLET | Freq: Two times a day (BID) | ORAL | 0 refills | Status: DC

## 2019-11-14 MED ORDER — FLUCONAZOLE 150 MG PO TABS: 150.0000 mg | ORAL_TABLET | Freq: Once | ORAL | 0 refills | Status: AC

## 2019-11-14 NOTE — Progress Notes (Signed)
Virtual Visit via Video   I connected with patient on 11/14/19 at 11:00 AM EDT by a video enabled telemedicine application and verified that I am speaking with the correct person using two identifiers.  Location patient: Home Location provider: [facility], Office Persons participating in the virtual visit: Patient, Provider, [place] (Patina Moore)  I discussed the limitations of evaluation and management by telemedicine and the availability of in person appointments. The patient expressed understanding and agreed to proceed.  Subjective:   HPI:   Patient presents via [facility] today c/o several days of sinus pressure, now with sinus pain, facial pain and some upper tooth pain bilaterally. Notes significant nasal congestion and PND. Denies fever, chills. Denies recent travel or sick contact. Has been taking Mucinex-DM and Tylenol to help with symptoms.   ROS:   See pertinent positives and negatives per HPI.  Patient Active Problem List   Diagnosis Date Noted  . Lateral subluxation of right patella 08/15/2019  . Pain of right hip joint 05/21/2018  . Degenerative arthritis of knee, bilateral 10/18/2017  . Physical exam 04/22/2016  . Hypothyroidism 10/24/2015  . Hyperlipidemia 10/24/2015  . Obesity (BMI 35.0-39.9 without comorbidity) 10/24/2015    Social History   Tobacco Use  . Smoking status: Never Smoker  . Smokeless tobacco: Never Used  Substance Use Topics  . Alcohol use: No    Current Outpatient Medications:  .  atorvastatin (LIPITOR) 40 MG tablet, TAKE 1 TABLET BY MOUTH EVERY DAY, Disp: 90 tablet, Rfl: 1 .  diclofenac sodium (VOLTAREN) 1 % GEL, Apply topically to affected area qid, Disp: 112 g, Rfl: 2 .  fluticasone (FLONASE) 50 MCG/ACT nasal spray, INSTILL 2 SPRAYS IN EACH NOSTRIL ONCE A DAY, Disp: 48 mL, Rfl: 1 .  furosemide (LASIX) 40 MG tablet, TAKE 1 TABLET BY MOUTH EVERY DAY, Disp: 90 tablet, Rfl: 1 .  gabapentin (NEURONTIN) 300 MG capsule, TAKE 1  CAPSULE AT BEDTIME FOR 1 WEEK, THEN INCREASE TO 1 CAP TWICE A DAY FOR 1 WEEK, THEN 1 CAP 3 TIMES A DAY AS NEEDED, Disp: 270 capsule, Rfl: 1 .  levothyroxine (SYNTHROID) 100 MCG tablet, TAKE 1 TABLET BY MOUTH EVERY DAY, Disp: 90 tablet, Rfl: 1 .  MELATONIN PO, Take 1 tablet by mouth at bedtime as needed., Disp: , Rfl:  .  meloxicam (MOBIC) 15 MG tablet, TAKE 1 TABLET BY MOUTH EVERY DAY, Disp: 30 tablet, Rfl: 3 .  Multiple Vitamin (MULTIVITAMIN) tablet, Take 1 tablet by mouth daily., Disp: , Rfl:  .  Probiotic Product (PROBIOTIC-10) CHEW, Chew by mouth., Disp: , Rfl:  .  promethazine (PHENERGAN) 25 MG tablet, Take 1 tablet (25 mg total) by mouth every 6 (six) hours as needed for nausea or vomiting., Disp: 30 tablet, Rfl: 2  Allergies  Allergen Reactions  . Sulfa Antibiotics Nausea Only  . Brimonidine Tartrate     Objective:   Temp (!) 97.5 F (36.4 C) (Oral)   Patient is well-developed, well-nourished in no acute distress.  Resting comfortably at home.  Head is normocephalic, atraumatic.  No labored breathing.  Speech is clear and coherent with logical content.  Patient is alert and oriented at baseline.  + TTP of sinuses bilaterally.   Assessment and Plan:   1. Acute bacterial sinusitis Rx Augmentin.  Increase fluids.  Rest.  Saline nasal spray.  Probiotic.  Mucinex as directed.  Humidifier in bedroom. Restart Claritin. Continue Flonase.  Call or return to clinic if symptoms are not improving.  -  amoxicillin-clavulanate (AUGMENTIN) 875-125 MG tablet; Take 1 tablet by mouth 2 (two) times daily.  Dispense: 14 tablet; Refill: 0 - fluconazole (DIFLUCAN) 150 MG tablet; Take 1 tablet (150 mg total) by mouth once for 1 dose.  Dispense: 1 tablet; Refill: 0    Leeanne Rio, [place] 11/14/2019

## 2019-11-14 NOTE — Patient Instructions (Signed)
Instructions sent to MyChart

## 2019-11-15 ENCOUNTER — Ambulatory Visit: Payer: Medicare Other

## 2019-11-15 ENCOUNTER — Ambulatory Visit: Payer: Medicare Other | Admitting: Family Medicine

## 2019-12-04 ENCOUNTER — Other Ambulatory Visit: Payer: Self-pay | Admitting: Family Medicine

## 2019-12-12 ENCOUNTER — Other Ambulatory Visit: Payer: Self-pay | Admitting: Family Medicine

## 2019-12-12 DIAGNOSIS — Z1231 Encounter for screening mammogram for malignant neoplasm of breast: Secondary | ICD-10-CM

## 2019-12-14 ENCOUNTER — Ambulatory Visit: Payer: Medicare Other

## 2019-12-18 ENCOUNTER — Other Ambulatory Visit: Payer: Self-pay

## 2019-12-18 ENCOUNTER — Ambulatory Visit
Admission: RE | Admit: 2019-12-18 | Discharge: 2019-12-18 | Disposition: A | Discharge status: Home / Self Care | Payer: Medicare Other | Source: Ambulatory Visit | Attending: Family Medicine | Admitting: Family Medicine

## 2019-12-18 DIAGNOSIS — Z1231 Encounter for screening mammogram for malignant neoplasm of breast: Secondary | ICD-10-CM

## 2019-12-20 ENCOUNTER — Ambulatory Visit: Payer: Medicare Other | Admitting: Family Medicine

## 2019-12-22 ENCOUNTER — Telehealth: Payer: Self-pay | Admitting: Family Medicine

## 2019-12-22 NOTE — Telephone Encounter (Signed)
Left message for patient to schedule Annual Wellness Visit.  Please schedule with Nurse Health Advisor Martha Stanley, RN at [place]  

## 2019-12-25 NOTE — Progress Notes (Signed)
Subjective:   Lynn [name] is a 69 y.o. female who presents for Medicare Annual (Subsequent) preventive examination.  I connected with Lynn [name] today by telephone and verified that I am speaking with the correct person using two identifiers. Location patient: home Location provider: work Persons participating in the virtual visit: patient, Marine scientist.    I discussed the limitations, risks, security and privacy concerns of performing an evaluation and management service by telephone and the availability of in person appointments. I also discussed with the patient that there may be a patient responsible charge related to this service. The patient expressed understanding and verbally consented to this telephonic visit.    Interactive audio and video telecommunications were attempted between this provider and patient, however failed, due to patient having technical difficulties OR patient did not have access to video capability.  We continued and completed visit with audio only.  Some vital signs may be absent or patient reported.   Time Spent with patient on telephone encounter: 20 minutes  Review of Systems     Cardiac Risk Factors include: advanced age (>23men, >98 women);dyslipidemia;obesity (BMI >30kg/m2)     Objective:    Today's Vitals   12/26/19 1331  Weight: 195 lb (88.5 kg)  Height: 5\' 2"  (1.575 m)  PainSc: 5    Body mass index is 35.67 kg/m.  Advanced Directives 12/26/2019 10/22/2017 06/09/2017  Does Patient Have a Medical Advance Directive? Yes No Yes  Type of Paramedic of [place];Living will - [place];Living will  Copy of [place] in Chart? Yes - validated most recent copy scanned in chart (See row information) - Yes  Would patient like information on creating a medical advance directive? - No - Patient declined -    Current Medications (verified) Outpatient Encounter Medications as of 12/26/2019   Medication Sig  . atorvastatin (LIPITOR) 40 MG tablet TAKE 1 TABLET BY MOUTH EVERY DAY  . diclofenac sodium (VOLTAREN) 1 % GEL Apply topically to affected area qid  . fluticasone (FLONASE) 50 MCG/ACT nasal spray INSTILL 2 SPRAYS IN EACH NOSTRIL ONCE A DAY  . furosemide (LASIX) 40 MG tablet TAKE 1 TABLET BY MOUTH EVERY DAY  . gabapentin (NEURONTIN) 300 MG capsule TAKE 1 CAPSULE AT BEDTIME FOR 1 WEEK, THEN INCREASE TO 1 CAP TWICE A DAY FOR 1 WEEK, THEN 1 CAP 3 TIMES A DAY AS NEEDED  . levothyroxine (SYNTHROID) 100 MCG tablet TAKE 1 TABLET BY MOUTH EVERY DAY  . MELATONIN PO Take 1 tablet by mouth at bedtime as needed.  . meloxicam (MOBIC) 15 MG tablet TAKE 1 TABLET BY MOUTH EVERY DAY  . Multiple Vitamin (MULTIVITAMIN) tablet Take 1 tablet by mouth daily.  . Probiotic Product (PROBIOTIC-10) CHEW Chew by mouth.  . promethazine (PHENERGAN) 25 MG tablet Take 1 tablet (25 mg total) by mouth every 6 (six) hours as needed for nausea or vomiting.  . [DISCONTINUED] amoxicillin-clavulanate (AUGMENTIN) 875-125 MG tablet Take 1 tablet by mouth 2 (two) times daily.   No facility-administered encounter medications on file as of 12/26/2019.    Allergies (verified) Sulfa antibiotics and Brimonidine tartrate   History: Past Medical History:  Diagnosis Date  . Allergy   . Arthritis    left knee  . Basal cell carcinoma of skin 10/14/1994   Right breast  . Basal cell carcinoma of skin 12/07/2011   Left inner elbow - TX p BX  . Hyperlipidemia   . Hypothyroidism   . Squamous  cell carcinoma of skin 12/07/2011   Left lower leg - TX p BX  . Squamous cell carcinoma of skin 02/02/2017   Left anterior thigh - CX3 + 5FU   Past Surgical History:  Procedure Laterality Date  . c-section 1983    . COLONOSCOPY    . KNEE ARTHROSCOPY    . PARTIAL HYSTERECTOMY    . POLYPECTOMY    . TONSILLECTOMY    . TUBAL LIGATION     Family History  Problem Relation Age of Onset  . Colon cancer Father   . Cancer Father         skin  . Cancer Paternal Grandfather        skin  . Colon polyps Sister   . Colon polyps Sister   . Colon polyps Sister   . Colon polyps Sister   . Pancreatic cancer Neg Hx   . Stomach cancer Neg Hx   . Esophageal cancer Neg Hx   . Rectal cancer Neg Hx    Social History   Socioeconomic History  . Marital status: Widowed    Spouse name: Not on file  . Number of children: Not on file  . Years of education: Not on file  . Highest education level: Not on file  Occupational History  . Occupation: retired  Tobacco Use  . Smoking status: Never Smoker  . Smokeless tobacco: Never Used  Vaping Use  . Vaping Use: Never used  Substance and Sexual Activity  . Alcohol use: No  . Drug use: No  . Sexual activity: Not on file  Other Topics Concern  . Not on file  Social History Narrative  . Not on file   Social Determinants of Health   Financial Resource Strain: Low Risk   . Difficulty of Paying Living Expenses: Not hard at all  Food Insecurity: No Food Insecurity  . Worried About Charity fundraiser in the Last Year: Never true  . Ran Out of Food in the Last Year: Never true  Transportation Needs: No Transportation Needs  . Lack of Transportation (Medical): No  . Lack of Transportation (Non-Medical): No  Physical Activity: Insufficiently Active  . Days of Exercise per Week: 3 days  . Minutes of Exercise per Session: 30 min  Stress: No Stress Concern Present  . Feeling of Stress : Not at all  Social Connections: Moderately Integrated  . Frequency of Communication with Friends and Family: Three times a week  . Frequency of Social Gatherings with Friends and Family: Three times a week  . Attends Religious Services: More than 4 times per year  . Active Member of Clubs or Organizations: Yes  . Attends Archivist Meetings: More than 4 times per year  . Marital Status: Widowed    Tobacco Counseling Counseling given: Not Answered   Clinical Intake:  Pre-visit  preparation completed: Yes  Pain : 0-10 Pain Score: 5  Pain Type: Chronic pain Pain Location: Knee Pain Onset: More than a month ago Pain Frequency: Intermittent Pain Relieving Factors: voltaren gel  Pain Relieving Factors: voltaren gel  Nutritional Status: BMI > 30  Obese Nutritional Risks: None Diabetes: No  How often do you need to have someone help you when you read instructions, pamphlets, or other written materials from your doctor or pharmacy?: 1 - Never What is the last grade level you completed in school?: Some college  Diabetic?No  Interpreter Needed?: No  Information entered by :: Caroleen Hamman LPN   Activities of  Daily Living In your present state of health, do you have any difficulty performing the following activities: 12/26/2019 05/09/2019  Hearing? N N  Vision? N N  Difficulty concentrating or making decisions? N N  Walking or climbing stairs? N N  Dressing or bathing? N N  Doing errands, shopping? N N  Preparing Food and eating ? N -  Using the Toilet? N -  In the past six months, have you accidently leaked urine? N -  Do you have problems with loss of bowel control? N -  Managing your Medications? N -  Managing your Finances? N -  Housekeeping or managing your Housekeeping? N -  Some recent data might be hidden    Patient Care Team: [name], MD as PCP - General (Family Medicine) Lavonna Monarch, MD as Consulting Physician (Dermatology) Inda Castle, MD (Inactive) as Consulting Physician (Gastroenterology) [name], [facility] as Pharmacist (Pharmacist)  Indicate any recent Medical Services you may have received from other than [facility] providers in the past year (date may be approximate).     Assessment:   This is a routine wellness examination for [place].  Hearing/Vision screen  Hearing Screening   125Hz  250Hz  500Hz  1000Hz  2000Hz  3000Hz  4000Hz  6000Hz  8000Hz   Right ear:           Left ear:           Comments: No issues  Vision  Screening Comments: Last eye exam-2020-[facility]  Dietary issues and exercise activities discussed: Current Exercise Habits: Home exercise routine, Type of exercise: walking, Time (Minutes): 30, Frequency (Times/Week): 3, Weekly Exercise (Minutes/Week): 90, Intensity: Mild  Goals    . Patient Stated     CARE PLAN ENTRY  Current Barriers:  . Chronic Disease Management support, education, and care coordination needs related to Hyperlipidemia, Hypothyroidism, and Osteoarthritis   Hyperlipidemia . Pharmacist Clinical Goal(s): o Over the next 90 days, patient will work with PharmD and providers to achieve LDL goal < 100 . Current regimen:  o Atorvastatin 40 mg daily  . Interventions: o Continue current management  . Patient self care activities - Over the next 90 days, patient will: o Continue current management   Osteoarthritis  . Pharmacist Clinical Goal(s) o Over the next 90 days, patient will work with PharmD and providers to minimize arthritis related pain in knees . Current regimen:  . Gabapentin 300 mg three times daily as needed  . Meloxicam 15 mg daily  . Voltaren 1% gel four times daily as needed  . Interventions: o Continue current management . Patient self care activities - Over the next 90 days, patient will: o Continue current management  Hypothyroidism . Pharmacist Clinical Goal(s) o Over the next 90 days, patient will work with PharmD and providers to maintain goal TSH levels. . Current regimen:  o Levothyroxine 100 mcg daily . Interventions: o Take first thing in the morning, 30 minutes before food and other medications.  . Patient self care activities - Over the next 90 days, patient will: o Continue current management  Medication management . Pharmacist Clinical Goal(s): o Over the next 90 days, patient will work with PharmD and providers to maintain optimal medication adherence . Current pharmacy: [facility] . Interventions o Comprehensive medication  review performed. o Continue current medication management strategy . Patient self care activities - Over the next 90 days, patient will: o Focus on medication adherence: Continue current management o Take medications as prescribed o Report any questions or concerns to  PharmD and/or provider(s)  Initial goal documentation.    . Weight (lb) < 178 lb (80.7 kg)     Lose weight by decreasing caloric intake and increase activity.       Depression Screen PHQ 2/9 Scores 12/26/2019 05/09/2019 10/24/2018 04/27/2018 06/09/2017 04/15/2017 02/02/2017  PHQ - 2 Score 0 0 0 0 0 0 0  PHQ- 9 Score - 0 - 0 - 0 0    Fall Risk Fall Risk  12/26/2019 05/09/2019 10/24/2018 04/27/2018 06/09/2017  Falls in the past year? 0 0 0 0 Yes  Number falls in past yr: 0 0 0 - 1  Injury with Fall? 0 0 0 - No  Follow up Falls prevention discussed Falls evaluation completed - - Falls prevention discussed    Any stairs in or around the home? Yes  If so, are there any without handrails? No  Home free of loose throw rugs in walkways, pet beds, electrical cords, etc? Yes  Adequate lighting in your home to reduce risk of falls? Yes   ASSISTIVE DEVICES UTILIZED TO PREVENT FALLS:  Life alert? No  Use of a cane, walker or w/c? No  Grab bars in the bathroom? Yes  Shower chair or bench in shower? No  Elevated toilet seat or a handicapped toilet? No   TIMED UP AND GO:  Was the test performed? No .     Cognitive Function:     6CIT Screen 12/26/2019  What Year? 0 points  What month? 0 points  What time? 0 points  Count back from 20 0 points  Months in reverse 0 points  Repeat phrase 0 points  Total Score 0    Immunizations Immunization History  Administered Date(s) Administered  . Fluad Quad(high Dose 65+) 02/17/2019  . Influenza, High Dose Seasonal PF 03/07/2018  . Influenza, Seasonal, Injecte, Preservative Fre 03/08/2012, 03/13/2013, 03/15/2014, 03/19/2015  . Influenza,inj,Quad PF,6+ Mos 04/02/2016, 02/02/2017   . Moderna SARS-COVID-2 Vaccination 07/26/2019, 08/17/2019  . Pneumococcal Conjugate-13 10/24/2015  . Pneumococcal Polysaccharide-23 06/09/2017  . Td 07/25/2018  . Tdap 10/24/2011  . Zoster 10/24/2014  . Zoster Recombinat (Shingrix) 02/03/2018, 06/22/2018    TDAP status: Up to date   Flu Vaccine status: Up to date   Pneumococcal vaccine status: Up to date   Covid-19 vaccine status: Completed vaccines  Qualifies for Shingles Vaccine? No   Zostavax completed Yes   Shingrix Completed?: Yes  Screening Tests Health Maintenance  Topic Date Due  . Hepatitis C Screening  05/08/2020 (Originally 1950-12-16)  . INFLUENZA VACCINE  01/07/2020  . MAMMOGRAM  12/17/2020  . COLONOSCOPY  02/23/2024  . TETANUS/TDAP  07/25/2028  . DEXA SCAN  Completed  . COVID-19 Vaccine  Completed  . PNA vac Low Risk Adult  Completed    Health Maintenance  There are no preventive care reminders to display for this patient.  Colorectal cancer screening: Completed 02/23/2019. Repeat every 5 years   Mammogram status: Completed 12/18/2019. Repeat every year   Bone Density status: Completed 11/12/2017. Results reflect: Bone density results: OSTEOPENIA. Repeat every 2 years.  Lung Cancer Screening: (Low Dose CT Chest recommended if Age 77-80 years, 30 pack-year currently smoking OR have quit w/in 15years.) does not qualify.    Additional Screening:  Hepatitis C Screening: does qualify; Discuss with PCP  Vision Screening: Recommended annual ophthalmology exams for early detection of glaucoma and other disorders of the eye. Is the patient up to date with their annual eye exam?  Yes  Who is the provider  or what is the name of the office in which the patient attends annual eye exams? [facility]   Dental Screening: Recommended annual dental exams for proper oral hygiene  Community Resource Referral / Chronic Care Management: CRR required this visit?  No   CCM required this visit?  No      Plan:     I  have personally reviewed and noted the following in the patient's chart:   . Medical and social history . Use of alcohol, tobacco or illicit drugs  . Current medications and supplements . Functional ability and status . Nutritional status . Physical activity . Advanced directives . List of other physicians . Hospitalizations, surgeries, and ER visits in previous 12 months . Vitals . Screenings to include cognitive, depression, and falls . Referrals and appointments  In addition, I have reviewed and discussed with patient certain preventive protocols, quality metrics, and best practice recommendations. A written personalized care plan for preventive services as well as general preventive health recommendations were provided to patient.     Due to this being a telephonic visit, the after visit summary with patients personalized plan was offered to patient via mail or my-chart.  Patient would like to access on my-chart.   Marta Antu, LPN   07/10/3341  Nurse Health Advisor  Nurse Notes: None

## 2019-12-26 ENCOUNTER — Ambulatory Visit (INDEPENDENT_AMBULATORY_CARE_PROVIDER_SITE_OTHER): Payer: Medicare Other

## 2019-12-26 VITALS — Ht 62.0 in | Wt 195.0 lb

## 2019-12-26 DIAGNOSIS — Z0000 Encounter for general adult medical examination without abnormal findings: Secondary | ICD-10-CM

## 2019-12-26 NOTE — Patient Instructions (Signed)
Lynn [name] , Thank you for taking time to come for your Medicare Wellness Visit. I appreciate your ongoing commitment to your health goals. Please review the following plan we discussed and let me know if I can assist you in the future.   Screening recommendations/referrals: Colonoscopy: Completed 02/23/2019-Due 02/23/2024 Mammogram: Completed 12/18/2019- Due 12/17/2020 Bone Density: Completed 11/12/2017-Due 6/7//2021-Call the office if you would like to schedule Recommended yearly ophthalmology/optometry visit for glaucoma screening and checkup Recommended yearly dental visit for hygiene and checkup  Vaccinations: Influenza vaccine: Due 02/2020 Pneumococcal vaccine: Completed vaccines Tdap vaccine: Up to date-Due 07/25/2028 Shingles vaccine: Completed vaccines   Covid-19:Completed vaccines  Advanced directives: Copy on file   Conditions/risks identified: See problem list  Next appointment: Follow up in one year for your annual wellness visit    Preventive Care 42 Years and Older, Female Preventive care refers to lifestyle choices and visits with your health care provider that can promote health and wellness. What does preventive care include?  A yearly physical exam. This is also called an annual well check.  Dental exams once or twice a year.  Routine eye exams. Ask your health care provider how often you should have your eyes checked.  Personal lifestyle choices, including:  Daily care of your teeth and gums.  Regular physical activity.  Eating a healthy diet.  Avoiding tobacco and drug use.  Limiting alcohol use.  Practicing safe sex.  Taking low-dose aspirin every day.  Taking vitamin and mineral supplements as recommended by your health care provider. What happens during an annual well check? The services and screenings done by your health care provider during your annual well check will depend on your age, overall health, lifestyle risk factors, and family history of  disease. Counseling  Your health care provider may ask you questions about your:  Alcohol use.  Tobacco use.  Drug use.  Emotional well-being.  Home and relationship well-being.  Sexual activity.  Eating habits.  History of falls.  Memory and ability to understand (cognition).  Work and work Statistician.  Reproductive health. Screening  You may have the following tests or measurements:  Height, weight, and BMI.  Blood pressure.  Lipid and cholesterol levels. These may be checked every 5 years, or more frequently if you are over 1 years old.  Skin check.  Lung cancer screening. You may have this screening every year starting at age 48 if you have a 30-pack-year history of smoking and currently smoke or have quit within the past 15 years.  Fecal occult blood test (FOBT) of the stool. You may have this test every year starting at age 58.  Flexible sigmoidoscopy or colonoscopy. You may have a sigmoidoscopy every 5 years or a colonoscopy every 10 years starting at age 53.  Hepatitis C blood test.  Hepatitis B blood test.  Sexually transmitted disease (STD) testing.  Diabetes screening. This is done by checking your blood sugar (glucose) after you have not eaten for a while (fasting). You may have this done every 1-3 years.  Bone density scan. This is done to screen for osteoporosis. You may have this done starting at age 2.  Mammogram. This may be done every 1-2 years. Talk to your health care provider about how often you should have regular mammograms. Talk with your health care provider about your test results, treatment options, and if necessary, the need for more tests. Vaccines  Your health care provider may recommend certain vaccines, such as:  Influenza vaccine. This is  recommended every year.  Tetanus, diphtheria, and acellular pertussis (Tdap, Td) vaccine. You may need a Td booster every 10 years.  Zoster vaccine. You may need this after age  76.  Pneumococcal 13-valent conjugate (PCV13) vaccine. One dose is recommended after age 69.  Pneumococcal polysaccharide (PPSV23) vaccine. One dose is recommended after age 69. Talk to your health care provider about which screenings and vaccines you need and how often you need them. This information is not intended to replace advice given to you by your health care provider. Make sure you discuss any questions you have with your health care provider. Document Released: 06/21/2015 Document Revised: 02/12/2016 Document Reviewed: 03/26/2015 Elsevier Interactive Patient Education  2017 [place] Prevention in the Home Falls can cause injuries. They can happen to people of all ages. There are many things you can do to make your home safe and to help prevent falls. What can I do on the outside of my home?  Regularly fix the edges of walkways and driveways and fix any cracks.  Remove anything that might make you trip as you walk through a door, such as a raised step or threshold.  Trim any bushes or trees on the path to your home.  Use bright outdoor lighting.  Clear any walking paths of anything that might make someone trip, such as rocks or tools.  Regularly check to see if handrails are loose or broken. Make sure that both sides of any steps have handrails.  Any raised decks and porches should have guardrails on the edges.  Have any leaves, snow, or ice cleared regularly.  Use sand or salt on walking paths during winter.  Clean up any spills in your garage right away. This includes oil or grease spills. What can I do in the bathroom?  Use night lights.  Install grab bars by the toilet and in the tub and shower. Do not use towel bars as grab bars.  Use non-skid mats or decals in the tub or shower.  If you need to sit down in the shower, use a plastic, non-slip stool.  Keep the floor dry. Clean up any water that spills on the floor as soon as it happens.  Remove  soap buildup in the tub or shower regularly.  Attach bath mats securely with double-sided non-slip rug tape.  Do not have throw rugs and other things on the floor that can make you trip. What can I do in the bedroom?  Use night lights.  Make sure that you have a light by your bed that is easy to reach.  Do not use any sheets or blankets that are too big for your bed. They should not hang down onto the floor.  Have a firm chair that has side arms. You can use this for support while you get dressed.  Do not have throw rugs and other things on the floor that can make you trip. What can I do in the kitchen?  Clean up any spills right away.  Avoid walking on wet floors.  Keep items that you use a lot in easy-to-reach places.  If you need to reach something above you, use a strong step stool that has a grab bar.  Keep electrical cords out of the way.  Do not use floor polish or wax that makes floors slippery. If you must use wax, use non-skid floor wax.  Do not have throw rugs and other things on the floor that can make you trip.  What can I do with my stairs?  Do not leave any items on the stairs.  Make sure that there are handrails on both sides of the stairs and use them. Fix handrails that are broken or loose. Make sure that handrails are as long as the stairways.  Check any carpeting to make sure that it is firmly attached to the stairs. Fix any carpet that is loose or worn.  Avoid having throw rugs at the top or bottom of the stairs. If you do have throw rugs, attach them to the floor with carpet tape.  Make sure that you have a light switch at the top of the stairs and the bottom of the stairs. If you do not have them, ask someone to add them for you. What else can I do to help prevent falls?  Wear shoes that:  Do not have high heels.  Have rubber bottoms.  Are comfortable and fit you well.  Are closed at the toe. Do not wear sandals.  If you use a  stepladder:  Make sure that it is fully opened. Do not climb a closed stepladder.  Make sure that both sides of the stepladder are locked into place.  Ask someone to hold it for you, if possible.  Clearly mark and make sure that you can see:  Any grab bars or handrails.  First and last steps.  Where the edge of each step is.  Use tools that help you move around (mobility aids) if they are needed. These include:  Canes.  Walkers.  Scooters.  Crutches.  Turn on the lights when you go into a dark area. Replace any light bulbs as soon as they burn out.  Set up your furniture so you have a clear path. Avoid moving your furniture around.  If any of your floors are uneven, fix them.  If there are any pets around you, be aware of where they are.  Review your medicines with your doctor. Some medicines can make you feel dizzy. This can increase your chance of falling. Ask your doctor what other things that you can do to help prevent falls. This information is not intended to replace advice given to you by your health care provider. Make sure you discuss any questions you have with your health care provider. Document Released: 03/21/2009 Document Revised: 10/31/2015 Document Reviewed: 06/29/2014 Elsevier Interactive Patient Education  2017 [employer].

## 2019-12-28 ENCOUNTER — Ambulatory Visit: Payer: Medicare Other | Admitting: Family Medicine

## 2020-01-30 ENCOUNTER — Encounter: Payer: Self-pay | Admitting: Family Medicine

## 2020-01-30 ENCOUNTER — Ambulatory Visit (INDEPENDENT_AMBULATORY_CARE_PROVIDER_SITE_OTHER): Payer: Medicare Other | Admitting: Family Medicine

## 2020-01-30 ENCOUNTER — Other Ambulatory Visit: Payer: Self-pay

## 2020-01-30 DIAGNOSIS — M170 Bilateral primary osteoarthritis of knee: Secondary | ICD-10-CM | POA: Diagnosis not present

## 2020-01-30 NOTE — Patient Instructions (Addendum)
Good to see you See me again in 3 months 

## 2020-01-30 NOTE — Progress Notes (Signed)
[facility] [address] [place] [address] Phone: [phone] Subjective:   I Lynn [name] am serving as a Education administrator for Dr. Hulan Saas.  This visit occurred during the SARS-CoV-2 public health emergency.  Safety protocols were in place, including screening questions prior to the visit, additional usage of staff PPE, and extensive cleaning of exam room while observing appropriate contact time as indicated for disinfecting solutions.   I'm seeing this patient by the request  of:  [name], MD  CC: Bilateral knee pain  TDV:[id number]   10/31/2019 Chronic problem with worsening pain.  Injections given again today.  Discussed medication management including the gabapentin and the meloxicam.  Discussed icing regimen and home exercises, follow-up again in 8 to 10 weeks.  Patient feels the steroid injection seems to be the most beneficial at this time  Update 01/30/2020 Lynn [name] is a 69 y.o. female coming in with complaint of bilateral knee pain. Patient states she has been feeling good after last visit. Went to [employer] and did a lot of walking and her knees are bothering her again.  Patient states since then has started having increasing discomfort and pain again.  Starting to affect daily activities again.     Past Medical History:  Diagnosis Date  . Allergy   . Arthritis    left knee  . Basal cell carcinoma of skin 10/14/1994   Right breast  . Basal cell carcinoma of skin 12/07/2011   Left inner elbow - TX p BX  . Hyperlipidemia   . Hypothyroidism   . Squamous cell carcinoma of skin 12/07/2011   Left lower leg - TX p BX  . Squamous cell carcinoma of skin 02/02/2017   Left anterior thigh - CX3 + 5FU   Past Surgical History:  Procedure Laterality Date  . c-section 1983    . COLONOSCOPY    . KNEE ARTHROSCOPY    . PARTIAL HYSTERECTOMY    . POLYPECTOMY    . TONSILLECTOMY    . TUBAL LIGATION     Social History    Socioeconomic History  . Marital status: Widowed    Spouse name: Not on file  . Number of children: Not on file  . Years of education: Not on file  . Highest education level: Not on file  Occupational History  . Occupation: retired  Tobacco Use  . Smoking status: Never Smoker  . Smokeless tobacco: Never Used  Vaping Use  . Vaping Use: Never used  Substance and Sexual Activity  . Alcohol use: No  . Drug use: No  . Sexual activity: Not on file  Other Topics Concern  . Not on file  Social History Narrative  . Not on file   Social Determinants of Health   Financial Resource Strain: Low Risk   . Difficulty of Paying Living Expenses: Not hard at all  Food Insecurity: No Food Insecurity  . Worried About Charity fundraiser in the Last Year: Never true  . Ran Out of Food in the Last Year: Never true  Transportation Needs: No Transportation Needs  . Lack of Transportation (Medical): No  . Lack of Transportation (Non-Medical): No  Physical Activity: Insufficiently Active  . Days of Exercise per Week: 3 days  . Minutes of Exercise per Session: 30 min  Stress: No Stress Concern Present  . Feeling of Stress : Not at all  Social Connections: Moderately Integrated  . Frequency of Communication with Friends  and Family: Three times a week  . Frequency of Social Gatherings with Friends and Family: Three times a week  . Attends Religious Services: More than 4 times per year  . Active Member of Clubs or Organizations: Yes  . Attends Archivist Meetings: More than 4 times per year  . Marital Status: Widowed   Allergies  Allergen Reactions  . Sulfa Antibiotics Nausea Only  . Brimonidine Tartrate    Family History  Problem Relation Age of Onset  . Colon cancer Father   . Cancer Father        skin  . Cancer Paternal Grandfather        skin  . Colon polyps Sister   . Colon polyps Sister   . Colon polyps Sister   . Colon polyps Sister   . Pancreatic cancer Neg Hx   .  Stomach cancer Neg Hx   . Esophageal cancer Neg Hx   . Rectal cancer Neg Hx     Current Outpatient Medications (Endocrine & Metabolic):  .  levothyroxine (SYNTHROID) 100 MCG tablet, TAKE 1 TABLET BY MOUTH EVERY DAY  Current Outpatient Medications (Cardiovascular):  .  atorvastatin (LIPITOR) 40 MG tablet, TAKE 1 TABLET BY MOUTH EVERY DAY .  furosemide (LASIX) 40 MG tablet, TAKE 1 TABLET BY MOUTH EVERY DAY  Current Outpatient Medications (Respiratory):  .  fluticasone (FLONASE) 50 MCG/ACT nasal spray, INSTILL 2 SPRAYS IN EACH NOSTRIL ONCE A DAY .  promethazine (PHENERGAN) 25 MG tablet, Take 1 tablet (25 mg total) by mouth every 6 (six) hours as needed for nausea or vomiting.  Current Outpatient Medications (Analgesics):  .  meloxicam (MOBIC) 15 MG tablet, TAKE 1 TABLET BY MOUTH EVERY DAY   Current Outpatient Medications (Other):  .  diclofenac sodium (VOLTAREN) 1 % GEL, Apply topically to affected area qid .  gabapentin (NEURONTIN) 300 MG capsule, TAKE 1 CAPSULE AT BEDTIME FOR 1 WEEK, THEN INCREASE TO 1 CAP TWICE A DAY FOR 1 WEEK, THEN 1 CAP 3 TIMES A DAY AS NEEDED .  MELATONIN PO, Take 1 tablet by mouth at bedtime as needed. .  Multiple Vitamin (MULTIVITAMIN) tablet, Take 1 tablet by mouth daily. .  Probiotic Product (PROBIOTIC-10) CHEW, Chew by mouth.   Reviewed prior external information including notes and imaging from  primary care provider As well as notes that were available from care everywhere and other healthcare systems.  Past medical history, social, surgical and family history all reviewed in electronic medical record.  No pertanent information unless stated regarding to the chief complaint.   Review of Systems:  No headache, visual changes, nausea, vomiting, diarrhea, constipation, dizziness, abdominal pain, skin rash, fevers, chills, night sweats, weight loss, swollen lymph nodes, body aches, joint swelling, chest pain, shortness of breath, mood changes. POSITIVE  muscle aches  Objective  Blood pressure (!) 154/90, pulse 84, height 5\' 2"  (1.575 m), weight 196 lb (88.9 kg), SpO2 94 %.   General: No apparent distress alert and oriented x3 mood and affect normal, dressed appropriately.  HEENT: Pupils equal, extraocular movements intact  Respiratory: Patient's speak in full sentences and does not appear short of breath  Cardiovascular: 1+ lower extremity edema, non tender, no erythema  Gait antalgic Knee: Bilateral valgus deformity noted. Large thigh to calf ratio.  Tender to palpation over medial and PF joint line.  ROM mildly limited in flexion extension instability with valgus force.  painful patellar compression. Patellar glide with moderate crepitus. Patellar and quadriceps tendons unremarkable.  Hamstring and quadriceps strength is normal.  After informed written and verbal consent, patient was seated on exam table. Right knee was prepped with alcohol swab and utilizing anterolateral approach, patient's right knee space was injected with 4:1  marcaine 0.5%: Kenalog 40mg /dL. Patient tolerated the procedure well without immediate complications.  After informed written and verbal consent, patient was seated on exam table. Left knee was prepped with alcohol swab and utilizing anterolateral approach, patient's left knee space was injected with 4:1  marcaine 0.5%: Kenalog 40mg /dL. Patient tolerated the procedure well without immediate complications.   Impression and Recommendations:     The above documentation has been reviewed and is accurate and complete Lyndal Pulley, DO       Note: This dictation was prepared with Dragon dictation along with smaller phrase technology. Any transcriptional errors that result from this process are unintentional.

## 2020-01-30 NOTE — Assessment & Plan Note (Signed)
Bilateral lateral knee injection was given today.  Tolerated the procedure well.  Patient feels that the steroid injections are doing well and gives her somewhere between 2 to 3 months of near complete relief.  Patient given injections again today for this chronic problem with mild exacerbation due to other comorbidities will hold on any too much of medications and patient does not want any surgical intervention.  Follow-up again in 3 months

## 2020-02-01 ENCOUNTER — Ambulatory Visit: Payer: Medicare Other | Admitting: Family Medicine

## 2020-02-09 ENCOUNTER — Ambulatory Visit: Payer: Medicare Other | Admitting: Family Medicine

## 2020-02-28 ENCOUNTER — Other Ambulatory Visit: Payer: Self-pay | Admitting: Family Medicine

## 2020-02-29 ENCOUNTER — Ambulatory Visit: Payer: Medicare Other | Admitting: Family Medicine

## 2020-03-11 ENCOUNTER — Ambulatory Visit (INDEPENDENT_AMBULATORY_CARE_PROVIDER_SITE_OTHER): Payer: Medicare Other

## 2020-03-11 ENCOUNTER — Other Ambulatory Visit: Payer: Self-pay

## 2020-03-11 DIAGNOSIS — Z23 Encounter for immunization: Secondary | ICD-10-CM | POA: Diagnosis not present

## 2020-03-13 ENCOUNTER — Telehealth: Payer: Self-pay

## 2020-03-13 NOTE — Progress Notes (Signed)
Chronic Care Management Pharmacy Assistant   Name: Lynn [name]  MRN: [id number] DOB: [birth year]  Reason for Encounter: Disease State   PCP : [name], MD  Allergies:   Allergies  Allergen Reactions   Sulfa Antibiotics Nausea Only   Brimonidine Tartrate     Medications: Outpatient Encounter Medications as of 03/13/2020  Medication Sig   atorvastatin (LIPITOR) 40 MG tablet TAKE 1 TABLET BY MOUTH EVERY DAY   diclofenac sodium (VOLTAREN) 1 % GEL Apply topically to affected area qid   fluticasone (FLONASE) 50 MCG/ACT nasal spray INSTILL 2 SPRAYS IN EACH NOSTRIL ONCE A DAY   furosemide (LASIX) 40 MG tablet TAKE 1 TABLET BY MOUTH EVERY DAY   gabapentin (NEURONTIN) 300 MG capsule TAKE 1 CAPSULE AT BEDTIME FOR 1 WEEK, THEN INCREASE TO 1 CAP TWICE A DAY FOR 1 WEEK, THEN 1 CAP 3 TIMES A DAY AS NEEDED   levothyroxine (SYNTHROID) 100 MCG tablet TAKE 1 TABLET BY MOUTH EVERY DAY   MELATONIN PO Take 1 tablet by mouth at bedtime as needed.   meloxicam (MOBIC) 15 MG tablet TAKE 1 TABLET BY MOUTH EVERY DAY   Multiple Vitamin (MULTIVITAMIN) tablet Take 1 tablet by mouth daily.   Probiotic Product (PROBIOTIC-10) CHEW Chew by mouth.   promethazine (PHENERGAN) 25 MG tablet Take 1 tablet (25 mg total) by mouth every 6 (six) hours as needed for nausea or vomiting.   No facility-administered encounter medications on file as of 03/13/2020.    Current Diagnosis: Patient Active Problem List   Diagnosis Date Noted   Lateral subluxation of right patella 08/15/2019   Pain of right hip joint 05/21/2018   Degenerative arthritis of knee, bilateral 10/18/2017   Physical exam 04/22/2016   Hypothyroidism 10/24/2015   Hyperlipidemia 10/24/2015   Obesity (BMI 35.0-39.9 without comorbidity) 10/24/2015     Reviewed chart prior to disease state call. Spoke with patient regarding BP  Recent Office Vitals: BP Readings from Last 3 Encounters:  01/30/20 (!) 154/90    10/31/19 108/78  09/12/19 112/62   Pulse Readings from Last 3 Encounters:  01/30/20 84  10/31/19 86  09/12/19 65    Wt Readings from Last 3 Encounters:  01/30/20 196 lb (88.9 kg)  12/26/19 195 lb (88.5 kg)  10/31/19 197 lb (89.4 kg)     Kidney Function Lab Results  Component Value Date/Time   CREATININE 1.00 05/12/2019 08:23 AM   CREATININE 1.04 10/28/2018 01:25 PM   GFR 55.00 (L) 05/12/2019 08:23 AM    BMP Latest Ref Rng & Units 05/12/2019 10/28/2018 04/27/2018  Glucose 70 - 99 mg/dL 93 83 89  BUN 6 - 23 mg/dL 10 13 17   Creatinine 0.40 - 1.20 mg/dL 1.00 1.04 1.03  Sodium 135 - 145 mEq/L 144 143 142  Potassium 3.5 - 5.1 mEq/L 4.0 4.5 4.2  Chloride 96 - 112 mEq/L 104 103 104  CO2 19 - 32 mEq/L 30 30 31   Calcium 8.4 - 10.5 mg/dL 9.3 9.3 9.4     Current antihypertensive regimen:   None Noted  How often are you checking your Blood Pressure? Patient states she does not check blood pressures at home  Current home BP readings: N/a  What recent interventions/DTPs have been made by any provider to improve Blood Pressure control since last CPP Visit: None Noted  Any recent hospitalizations or ED visits since last visit with CPP? No  What diet changes have been made to improve Blood Pressure Control?  o Patient  states she does not follow a diet  What exercise is being done to improve your Blood Pressure Control?  o Patient states she does not do any exercise  Adherence Review: Is the patient currently on ACE/ARB medication? No Does the patient have >5 day gap between last estimated fill dates? No   [name]    Follow-Up:  Pharmacist Review

## 2020-03-18 ENCOUNTER — Ambulatory Visit: Payer: Medicare Other | Admitting: Family Medicine

## 2020-03-24 ENCOUNTER — Other Ambulatory Visit: Payer: Self-pay | Admitting: Family Medicine

## 2020-03-28 ENCOUNTER — Other Ambulatory Visit: Payer: Self-pay | Admitting: Family Medicine

## 2020-04-16 NOTE — Progress Notes (Signed)
[facility] [address] [place] Phone: [phone] Subjective:   I Lynn [name] am serving as a Education administrator for Dr. Hulan Saas.  This visit occurred during the SARS-CoV-2 public health emergency.  Safety protocols were in place, including screening questions prior to the visit, additional usage of staff PPE, and extensive cleaning of exam room while observing appropriate contact time as indicated for disinfecting solutions.   I'm seeing this patient by the request  of:  [name], MD  CC:  Severe knee pain   [id number]   01/30/2020 Bilateral lateral knee injection was given today.  Tolerated the procedure well.  Patient feels that the steroid injections are doing well and gives her somewhere between 2 to 3 months of near complete relief.  Patient given injections again today for this chronic problem with mild exacerbation due to other comorbidities will hold on any too much of medications and patient does not want any surgical intervention.  Follow-up again in 3 months   Update 04/16/2020 Lynn [name] is a 69 y.o. female coming in with complaint of bilateral knee pain. Patient states the knees have been painful. Going up stairs is fine going up is difficult. Would like injections and pennsaid.    xrays of knee 08/15/19 showed severe end stage OA of the knees   Past Medical History:  Diagnosis Date  . Allergy   . Arthritis    left knee  . Basal cell carcinoma of skin 10/14/1994   Right breast  . Basal cell carcinoma of skin 12/07/2011   Left inner elbow - TX p BX  . Hyperlipidemia   . Hypothyroidism   . Squamous cell carcinoma of skin 12/07/2011   Left lower leg - TX p BX  . Squamous cell carcinoma of skin 02/02/2017   Left anterior thigh - CX3 + 5FU   Past Surgical History:  Procedure Laterality Date  . c-section 1983    . COLONOSCOPY    . KNEE ARTHROSCOPY    . PARTIAL HYSTERECTOMY    . POLYPECTOMY    .  TONSILLECTOMY    . TUBAL LIGATION     Social History   Socioeconomic History  . Marital status: Widowed    Spouse name: Not on file  . Number of children: Not on file  . Years of education: Not on file  . Highest education level: Not on file  Occupational History  . Occupation: retired  Tobacco Use  . Smoking status: Never Smoker  . Smokeless tobacco: Never Used  Vaping Use  . Vaping Use: Never used  Substance and Sexual Activity  . Alcohol use: No  . Drug use: No  . Sexual activity: Not on file  Other Topics Concern  . Not on file  Social History Narrative  . Not on file   Social Determinants of Health   Financial Resource Strain: Low Risk   . Difficulty of Paying Living Expenses: Not hard at all  Food Insecurity: No Food Insecurity  . Worried About Charity fundraiser in the Last Year: Never true  . Ran Out of Food in the Last Year: Never true  Transportation Needs: No Transportation Needs  . Lack of Transportation (Medical): No  . Lack of Transportation (Non-Medical): No  Physical Activity: Insufficiently Active  . Days of Exercise per Week: 3 days  . Minutes of Exercise per Session: 30 min  Stress: No Stress Concern Present  . Feeling of Stress :  Not at all  Social Connections: Moderately Integrated  . Frequency of Communication with Friends and Family: Three times a week  . Frequency of Social Gatherings with Friends and Family: Three times a week  . Attends Religious Services: More than 4 times per year  . Active Member of Clubs or Organizations: Yes  . Attends Archivist Meetings: More than 4 times per year  . Marital Status: Widowed   Allergies  Allergen Reactions  . Sulfa Antibiotics Nausea Only  . Brimonidine Tartrate    Family History  Problem Relation Age of Onset  . Colon cancer Father   . Cancer Father        skin  . Cancer Paternal Grandfather        skin  . Colon polyps Sister   . Colon polyps Sister   . Colon polyps Sister     . Colon polyps Sister   . Pancreatic cancer Neg Hx   . Stomach cancer Neg Hx   . Esophageal cancer Neg Hx   . Rectal cancer Neg Hx     Current Outpatient Medications (Endocrine & Metabolic):  .  levothyroxine (SYNTHROID) 100 MCG tablet, TAKE 1 TABLET BY MOUTH EVERY DAY  Current Outpatient Medications (Cardiovascular):  .  atorvastatin (LIPITOR) 40 MG tablet, TAKE 1 TABLET BY MOUTH EVERY DAY .  furosemide (LASIX) 40 MG tablet, TAKE 1 TABLET BY MOUTH EVERY DAY  Current Outpatient Medications (Respiratory):  .  fluticasone (FLONASE) 50 MCG/ACT nasal spray, INSTILL 2 SPRAYS IN EACH NOSTRIL ONCE A DAY .  promethazine (PHENERGAN) 25 MG tablet, Take 1 tablet (25 mg total) by mouth every 6 (six) hours as needed for nausea or vomiting.  Current Outpatient Medications (Analgesics):  .  meloxicam (MOBIC) 15 MG tablet, TAKE 1 TABLET BY MOUTH EVERY DAY   Current Outpatient Medications (Other):  .  diclofenac sodium (VOLTAREN) 1 % GEL, Apply topically to affected area qid .  gabapentin (NEURONTIN) 300 MG capsule, TAKE 1 CAPSULE AT BEDTIME FOR 1 WEEK, THEN INCREASE TO 1 CAP TWICE A DAY FOR 1 WEEK, THEN 1 CAP 3 TIMES A DAY AS NEEDED .  MELATONIN PO, Take 1 tablet by mouth at bedtime as needed. .  Multiple Vitamin (MULTIVITAMIN) tablet, Take 1 tablet by mouth daily. .  Probiotic Product (PROBIOTIC-10) CHEW, Chew by mouth.   Reviewed prior external information including notes and imaging from  primary care provider As well as notes that were available from care everywhere and other healthcare systems.  Past medical history, social, surgical and family history all reviewed in electronic medical record.  No pertanent information unless stated regarding to the chief complaint.   Review of Systems:  No headache, visual changes, nausea, vomiting, diarrhea, constipation, dizziness, abdominal pain, skin rash, fevers, chills, night sweats, weight loss, swollen lymph nodes, body aches, joint swelling,  chest pain, shortness of breath, mood changes. POSITIVE muscle aches  Objective  Blood pressure 120/90, pulse 86, height 5\' 2"  (1.575 m), weight 194 lb (88 kg), SpO2 96 %.   General: No apparent distress alert and oriented x3 mood and affect normal, dressed appropriately.  HEENT: Pupils equal, extraocular movements intact  Respiratory: Patient's speak in full sentences and does not appear short of breath  Cardiovascular: No lower extremity edema, non tender, no erythema  Severely antalgic gait Knee: Bilateral  varus deformity noted. Large thigh to calf ratio.  Tender to palpation over medial and PF joint line.  ROM full limited in flexion and the  last 5 degrees of extension left greater than right instability with valgus force.  painful patellar compression. Patellar glide with moderate crepitus. Patellar and quadriceps tendons unremarkable. Hamstring and quadriceps strength is normal.  After informed written and verbal consent, patient was seated on exam table. Right knee was prepped with alcohol swab and utilizing anterolateral approach, patient's right knee space was injected with 4:1  marcaine 0.5%: Kenalog 40mg /dL. Patient tolerated the procedure well without immediate complications.  After informed written and verbal consent, patient was seated on exam table. Left knee was prepped with alcohol swab and utilizing anterolateral approach, patient's left knee space was injected with 4:1  marcaine 0.5%: Kenalog 40mg /dL. Patient tolerated the procedure well without immediate complications.    Impression and Recommendations:     The above documentation has been reviewed and is accurate and complete Lyndal Pulley, DO

## 2020-04-17 ENCOUNTER — Other Ambulatory Visit: Payer: Self-pay

## 2020-04-17 ENCOUNTER — Encounter: Payer: Self-pay | Admitting: Family Medicine

## 2020-04-17 ENCOUNTER — Ambulatory Visit (INDEPENDENT_AMBULATORY_CARE_PROVIDER_SITE_OTHER): Payer: Medicare Other | Admitting: Family Medicine

## 2020-04-17 DIAGNOSIS — M170 Bilateral primary osteoarthritis of knee: Secondary | ICD-10-CM

## 2020-04-17 NOTE — Patient Instructions (Signed)
Good to see you Happy Holidays!!!! See me again in 3 months

## 2020-04-17 NOTE — Assessment & Plan Note (Signed)
Chronic problem with exacerbation.  Severe osteoarthritic changes.  Patient has been having injections now for 2 years going on 3.  Discussed with patient about the opportunity for potential surgical intervention with patient declined at the moment.  Patient will do the steroid injection and will consider the possibility of viscosupplementation again.  Patient will follow up with me again in 3 months otherwise.

## 2020-04-23 ENCOUNTER — Ambulatory Visit: Payer: Medicare Other | Admitting: Family Medicine

## 2020-05-06 ENCOUNTER — Telehealth: Payer: Medicare Other

## 2020-05-06 ENCOUNTER — Telehealth: Payer: Self-pay

## 2020-05-06 ENCOUNTER — Other Ambulatory Visit: Payer: Self-pay

## 2020-05-06 NOTE — Progress Notes (Signed)
Left message regarding having to reschedule patients appointment that she has scheduled today 05-06-20 at 1pm.   Left message for patient to return my call so I can reschedule her appointment.    12:10pm -       Patient returned my call regarding appointment. Per patient she didn't recall about having an appointment today . Informed patient that I can reschedule for her and per patient she stated she will give me a call back to reschedule.    Georgiana Shore ,[place] Pharmacist Assistant [phone]

## 2020-05-23 DIAGNOSIS — Z23 Encounter for immunization: Secondary | ICD-10-CM | POA: Diagnosis not present

## 2020-06-26 ENCOUNTER — Ambulatory Visit: Payer: Medicare Other | Admitting: Family Medicine

## 2020-06-28 ENCOUNTER — Telehealth: Payer: Self-pay

## 2020-06-28 NOTE — Chronic Care Management (AMB) (Signed)
    Chronic Care Management Pharmacy Assistant   Name: Lynn [name]  MRN: [id number] DOB: Nov 22, 1950  Reason for Encounter: Disease State/ General Adherence Call   PCP : [name], MD  Allergies:   Allergies  Allergen Reactions  . Sulfa Antibiotics Nausea Only  . Brimonidine Tartrate     Medications: Outpatient Encounter Medications as of 06/28/2020  Medication Sig  . atorvastatin (LIPITOR) 40 MG tablet TAKE 1 TABLET BY MOUTH EVERY DAY  . diclofenac sodium (VOLTAREN) 1 % GEL Apply topically to affected area qid  . fluticasone (FLONASE) 50 MCG/ACT nasal spray INSTILL 2 SPRAYS IN EACH NOSTRIL ONCE A DAY  . furosemide (LASIX) 40 MG tablet TAKE 1 TABLET BY MOUTH EVERY DAY  . gabapentin (NEURONTIN) 300 MG capsule TAKE 1 CAPSULE AT BEDTIME FOR 1 WEEK, THEN INCREASE TO 1 CAP TWICE A DAY FOR 1 WEEK, THEN 1 CAP 3 TIMES A DAY AS NEEDED  . levothyroxine (SYNTHROID) 100 MCG tablet TAKE 1 TABLET BY MOUTH EVERY DAY  . MELATONIN PO Take 1 tablet by mouth at bedtime as needed.  . meloxicam (MOBIC) 15 MG tablet TAKE 1 TABLET BY MOUTH EVERY DAY  . Multiple Vitamin (MULTIVITAMIN) tablet Take 1 tablet by mouth daily.  . Probiotic Product (PROBIOTIC-10) CHEW Chew by mouth.  . promethazine (PHENERGAN) 25 MG tablet Take 1 tablet (25 mg total) by mouth every 6 (six) hours as needed for nausea or vomiting.   No facility-administered encounter medications on file as of 06/28/2020.    Current Diagnosis: Patient Active Problem List   Diagnosis Date Noted  . Lateral subluxation of right patella 08/15/2019  . Pain of right hip joint 05/21/2018  . Degenerative arthritis of knee, bilateral 10/18/2017  . Physical exam 04/22/2016  . Hypothyroidism 10/24/2015  . Hyperlipidemia 10/24/2015  . Obesity (BMI 35.0-39.9 without comorbidity) 10/24/2015    Have you seen any other providers since your last visit with [name], Pharm.D., [facility]?  Patient has not seen any other providers since her  last visit with [name], Pharm.D., [facility].  Have you had any problems recently with your health?  Patient states she has not had any problems recently with her health.  Have you had any problems with your pharmacy?  Patient states she has not had any problems with her pharmacy.  What issues or side effects are you having with your medications?  Patient states she is not having any issues or side effects from any of her medications.  What would you like me to pass along to [name], [place].D., [facility] for them to help you with?   Patient states she can't think of anything for me to pass along to [name], [place].D., [facility] at this time.  What can we do to take care of you better?  Patient states she is doing fine at this time.  April D Calhoun, [place] Pharmacist Assistant [phone]   Follow-Up:  Pharmacist Review

## 2020-07-03 NOTE — Progress Notes (Signed)
[facility] [place] Phone: [phone] Subjective:   Lynn [name], am serving as a scribe for Dr. Hulan Saas. This visit occurred during the SARS-CoV-2 public health emergency.  Safety protocols were in place, including screening questions prior to the visit, additional usage of staff PPE, and extensive cleaning of exam room while observing appropriate contact time as indicated for disinfecting solutions.   I'm seeing this patient by the request  of:  [name], MD  CC: Bilateral knee pain follow-up  [id number]   04/17/2020 Chronic problem with exacerbation.  Severe osteoarthritic changes.  Patient has been having injections now for 2 years going on 3.  Discussed with patient about the opportunity for potential surgical intervention with patient declined at the moment.  Patient will do the steroid injection and will consider the possibility of viscosupplementation again.  Patient will follow up with me again in 3 months otherwise.  Update 07/04/2020 Lynn [name] is a 70 y.o. female coming in with complaint of bilateral knee pain. Patient states that her pain has begun to increase in past couple of weeks. Deep knee bending increases her pain. Pain in anterior lateral aspect of knees.    Patient denies x-rays from March 2021 showing severe end-stage osteoarthritic changes of the knees.  Last injections April 17, 2020  Past Medical History:  Diagnosis Date  . Allergy   . Arthritis    left knee  . Basal cell carcinoma of skin 10/14/1994   Right breast  . Basal cell carcinoma of skin 12/07/2011   Left inner elbow - TX p BX  . Hyperlipidemia   . Hypothyroidism   . Squamous cell carcinoma of skin 12/07/2011   Left lower leg - TX p BX  . Squamous cell carcinoma of skin 02/02/2017   Left anterior thigh - CX3 + 5FU   Past Surgical History:  Procedure Laterality Date  . c-section 1983    .  COLONOSCOPY    . KNEE ARTHROSCOPY    . PARTIAL HYSTERECTOMY    . POLYPECTOMY    . TONSILLECTOMY    . TUBAL LIGATION     Social History   Socioeconomic History  . Marital status: Widowed    Spouse name: Not on file  . Number of children: Not on file  . Years of education: Not on file  . Highest education level: Not on file  Occupational History  . Occupation: retired  Tobacco Use  . Smoking status: Never Smoker  . Smokeless tobacco: Never Used  Vaping Use  . Vaping Use: Never used  Substance and Sexual Activity  . Alcohol use: [name]  . Drug use: [name]  . Sexual activity: Not on file  Other Topics Concern  . Not on file  Social History Narrative  . Not on file   Social Determinants of Health   Financial Resource Strain: Low Risk   . Difficulty of Paying Living Expenses: Not hard at all  Food Insecurity: [name] Food Insecurity  . Worried About Charity fundraiser in the Last Year: Never true  . Ran Out of Food in the Last Year: Never true  Transportation Needs: [name] Transportation Needs  . Lack of Transportation (Medical): [name]  . Lack of Transportation (Non-Medical): [name]  Physical Activity: Insufficiently Active  . Days of Exercise per Week: 3 days  . Minutes of Exercise per Session: 30 min  Stress: [name] Stress Concern Present  . Feeling of Stress :  Not at all  Social Connections: Moderately Integrated  . Frequency of Communication with Friends and Family: Three times a week  . Frequency of Social Gatherings with Friends and Family: Three times a week  . Attends Religious Services: More than 4 times per year  . Active Member of Clubs or Organizations: Yes  . Attends Archivist Meetings: More than 4 times per year  . Marital Status: Widowed   Allergies  Allergen Reactions  . Sulfa Antibiotics Nausea Only  . Brimonidine Tartrate    Family History  Problem Relation Age of Onset  . Colon cancer Father   . Cancer Father        skin  . Cancer Paternal Grandfather         skin  . Colon polyps Sister   . Colon polyps Sister   . Colon polyps Sister   . Colon polyps Sister   . Pancreatic cancer Neg Hx   . Stomach cancer Neg Hx   . Esophageal cancer Neg Hx   . Rectal cancer Neg Hx     Current Outpatient Medications (Endocrine & Metabolic):  .  levothyroxine (SYNTHROID) 100 MCG tablet, TAKE 1 TABLET BY MOUTH EVERY DAY  Current Outpatient Medications (Cardiovascular):  .  atorvastatin (LIPITOR) 40 MG tablet, TAKE 1 TABLET BY MOUTH EVERY DAY .  furosemide (LASIX) 40 MG tablet, TAKE 1 TABLET BY MOUTH EVERY DAY  Current Outpatient Medications (Respiratory):  .  fluticasone (FLONASE) 50 MCG/ACT nasal spray, INSTILL 2 SPRAYS IN EACH NOSTRIL ONCE A DAY .  promethazine (PHENERGAN) 25 MG tablet, Take 1 tablet (25 mg total) by mouth every 6 (six) hours as needed for nausea or vomiting.  Current Outpatient Medications (Analgesics):  .  meloxicam (MOBIC) 15 MG tablet, TAKE 1 TABLET BY MOUTH EVERY DAY   Current Outpatient Medications (Other):  .  diclofenac sodium (VOLTAREN) 1 % GEL, Apply topically to affected area qid .  gabapentin (NEURONTIN) 300 MG capsule, TAKE 1 CAPSULE AT BEDTIME FOR 1 WEEK, THEN INCREASE TO 1 CAP TWICE A DAY FOR 1 WEEK, THEN 1 CAP 3 TIMES A DAY AS NEEDED .  MELATONIN PO, Take 1 tablet by mouth at bedtime as needed. .  Multiple Vitamin (MULTIVITAMIN) tablet, Take 1 tablet by mouth daily. .  Probiotic Product (PROBIOTIC-10) CHEW, Chew by mouth.   Reviewed prior external information including notes and imaging from  primary care provider As well as notes that were available from care everywhere and other healthcare systems.  Past medical history, social, surgical and family history all reviewed in electronic medical record.  [name] pertanent information unless stated regarding to the chief complaint.   Review of Systems:  [name] headache, visual changes, nausea, vomiting, diarrhea, constipation, dizziness, abdominal pain, skin rash, fevers,  chills, night sweats, weight loss, swollen lymph nodes, body aches, joint swelling, chest pain, shortness of breath, mood changes. POSITIVE muscle aches, joint swelling  Objective  Blood pressure (!) 122/98, pulse 77, height 5\' 2"  (1.575 m), weight 200 lb (90.7 kg).   General: [name] apparent distress alert and oriented x3 mood and affect normal, dressed appropriately.  Overweight HEENT: Pupils equal, extraocular movements intact  Respiratory: Patient's speak in full sentences and does not appear short of breath  Antalgic gait MSK: Knee: Bilateral valgus deformity noted. Large thigh to calf ratio.  Tender to palpation over medial and PF joint line.  ROM lacks last 5 degrees of extension bilaterally in the last 10 degrees of flexion on the right  instability with valgus force.  painful patellar compression. Patellar glide with moderate crepitus. Patellar and quadriceps tendons unremarkable. Hamstring and quadriceps strength is normal.  After informed written and verbal consent, patient was seated on exam table. Right knee was prepped with alcohol swab and utilizing anterolateral approach, patient's right knee space was injected with 4:1  marcaine 0.5%: Kenalog 40mg /dL. Patient tolerated the procedure well without immediate complications.  After informed written and verbal consent, patient was seated on exam table. Left knee was prepped with alcohol swab and utilizing anterolateral approach, patient's left knee space was injected with 4:1  marcaine 0.5%: Kenalog 40mg /dL. Patient tolerated the procedure well without immediate complications.    Impression and Recommendations:     The above documentation has been reviewed and is accurate and complete Lyndal Pulley, DO

## 2020-07-04 ENCOUNTER — Other Ambulatory Visit: Payer: Self-pay

## 2020-07-04 ENCOUNTER — Ambulatory Visit (INDEPENDENT_AMBULATORY_CARE_PROVIDER_SITE_OTHER): Payer: Medicare Other | Admitting: Family Medicine

## 2020-07-04 ENCOUNTER — Encounter: Payer: Self-pay | Admitting: Family Medicine

## 2020-07-04 DIAGNOSIS — M170 Bilateral primary osteoarthritis of knee: Secondary | ICD-10-CM | POA: Diagnosis not present

## 2020-07-04 NOTE — Patient Instructions (Signed)
Injected both knees today Will get approval for gel See me again in 5-6 weeks

## 2020-07-04 NOTE — Assessment & Plan Note (Signed)
Injection today.  Patient responds better to the viscosupplementation and we will try to get approval.  Patient does have end-stage osteoarthritic changes.  Discussed again that the possibility of surgical intervention will be needed in the near future.  Patient still declines that and wants to continue with the injections.  Follow-up with me again 4 to 6 weeks

## 2020-07-05 ENCOUNTER — Telehealth: Payer: Self-pay | Admitting: Family Medicine

## 2020-07-05 NOTE — Telephone Encounter (Signed)
Patient called stating that she was seen yesterday and had injections in both knees. Before she went to bed last night she had a really bad headache that has continued into today. She said that she has taken Tylenol, Advil, and Percocet but nothing has helped.  Please advise.  I told her that if it got worse, or if she noticed anything other side effects to go to UC or the emergency room.

## 2020-07-08 NOTE — Telephone Encounter (Signed)
Can we call and check in on her again, make sure she is feeling better?

## 2020-07-08 NOTE — Telephone Encounter (Signed)
Called patient. She said that she is feeling much better. Her headache went away and her knees are feeling much better.

## 2020-08-07 NOTE — Progress Notes (Signed)
[place] Phone: [phone] Subjective:   Lynn [name], am serving as a scribe for Dr. Hulan Saas. This visit occurred during the SARS-CoV-2 public health emergency.  Safety protocols were in place, including screening questions prior to the visit, additional usage of staff PPE, and extensive cleaning of exam room while observing appropriate contact time as indicated for disinfecting solutions.   I'm seeing this patient by the request  of:  [name], MD  CC: Bilateral knee pain follow-up  MCN:[id number]   07/04/2020 Injection today.  Patient responds better to the viscosupplementation and we will try to get approval.  Patient does have end-stage osteoarthritic changes.  Discussed again that the possibility of surgical intervention will be needed in the near future.  Patient still declines that and wants to continue with the injections.  Follow-up with me again 4 to 6 weeks  Update 08/08/2020 Lynn [name] is a 70 y.o. female coming in with complaint of bilateral knee pain. Patient states that her knee pain is up and down from day to day. Painful today as she was climbing into her yesterday.  Patient does have some increase in instability from time to time.     Past Medical History:  Diagnosis Date  . Allergy   . Arthritis    left knee  . Basal cell carcinoma of skin 10/14/1994   Right breast  . Basal cell carcinoma of skin 12/07/2011   Left inner elbow - TX p BX  . Hyperlipidemia   . Hypothyroidism   . Squamous cell carcinoma of skin 12/07/2011   Left lower leg - TX p BX  . Squamous cell carcinoma of skin 02/02/2017   Left anterior thigh - CX3 + 5FU   Past Surgical History:  Procedure Laterality Date  . c-section 1983    . COLONOSCOPY    . KNEE ARTHROSCOPY    . PARTIAL HYSTERECTOMY    . POLYPECTOMY    . TONSILLECTOMY    . TUBAL LIGATION     Social History   Socioeconomic  History  . Marital status: Widowed    Spouse name: Not on file  . Number of children: Not on file  . Years of education: Not on file  . Highest education level: Not on file  Occupational History  . Occupation: retired  Tobacco Use  . Smoking status: Never Smoker  . Smokeless tobacco: Never Used  Vaping Use  . Vaping Use: Never used  Substance and Sexual Activity  . Alcohol use: [name]  . Drug use: [name]  . Sexual activity: Not on file  Other Topics Concern  . Not on file  Social History Narrative  . Not on file   Social Determinants of Health   Financial Resource Strain: Low Risk   . Difficulty of Paying Living Expenses: Not hard at all  Food Insecurity: [name] Food Insecurity  . Worried About Charity fundraiser in the Last Year: Never true  . Ran Out of Food in the Last Year: Never true  Transportation Needs: [name] Transportation Needs  . Lack of Transportation (Medical): [name]  . Lack of Transportation (Non-Medical): [name]  Physical Activity: Insufficiently Active  . Days of Exercise per Week: 3 days  . Minutes of Exercise per Session: 30 min  Stress: [name] Stress Concern Present  . Feeling of Stress : Not at all  Social Connections: Moderately Integrated  . Frequency of Communication with Friends and  Family: Three times a week  . Frequency of Social Gatherings with Friends and Family: Three times a week  . Attends Religious Services: More than 4 times per year  . Active Member of Clubs or Organizations: Yes  . Attends Archivist Meetings: More than 4 times per year  . Marital Status: Widowed   Allergies  Allergen Reactions  . Sulfa Antibiotics Nausea Only  . Brimonidine Tartrate    Family History  Problem Relation Age of Onset  . Colon cancer Father   . Cancer Father        skin  . Cancer Paternal Grandfather        skin  . Colon polyps Sister   . Colon polyps Sister   . Colon polyps Sister   . Colon polyps Sister   . Pancreatic cancer Neg Hx   . Stomach cancer  Neg Hx   . Esophageal cancer Neg Hx   . Rectal cancer Neg Hx     Current Outpatient Medications (Endocrine & Metabolic):  .  levothyroxine (SYNTHROID) 100 MCG tablet, TAKE 1 TABLET BY MOUTH EVERY DAY  Current Outpatient Medications (Cardiovascular):  .  atorvastatin (LIPITOR) 40 MG tablet, TAKE 1 TABLET BY MOUTH EVERY DAY .  furosemide (LASIX) 40 MG tablet, TAKE 1 TABLET BY MOUTH EVERY DAY  Current Outpatient Medications (Respiratory):  .  fluticasone (FLONASE) 50 MCG/ACT nasal spray, INSTILL 2 SPRAYS IN EACH NOSTRIL ONCE A DAY .  promethazine (PHENERGAN) 25 MG tablet, Take 1 tablet (25 mg total) by mouth every 6 (six) hours as needed for nausea or vomiting.  Current Outpatient Medications (Analgesics):  .  meloxicam (MOBIC) 15 MG tablet, TAKE 1 TABLET BY MOUTH EVERY DAY   Current Outpatient Medications (Other):  .  diclofenac sodium (VOLTAREN) 1 % GEL, Apply topically to affected area qid .  gabapentin (NEURONTIN) 300 MG capsule, TAKE 1 CAPSULE AT BEDTIME FOR 1 WEEK, THEN INCREASE TO 1 CAP TWICE A DAY FOR 1 WEEK, THEN 1 CAP 3 TIMES A DAY AS NEEDED .  MELATONIN PO, Take 1 tablet by mouth at bedtime as needed. .  Multiple Vitamin (MULTIVITAMIN) tablet, Take 1 tablet by mouth daily. .  Probiotic Product (PROBIOTIC-10) CHEW, Chew by mouth.   Reviewed prior external information including notes and imaging from  primary care provider As well as notes that were available from care everywhere and other healthcare systems.  Past medical history, social, surgical and family history all reviewed in electronic medical record.  [name] pertanent information unless stated regarding to the chief complaint.   Review of Systems:  [name] headache, visual changes, nausea, vomiting, diarrhea, constipation, dizziness, abdominal pain, skin rash, fevers, chills, night sweats, weight loss, swollen lymph nodes, body aches, joint swelling, chest pain, shortness of breath, mood changes. POSITIVE muscle  aches  Objective  Blood pressure 124/84, pulse (!) 102, height 5\' 2"  (1.575 m), SpO2 96 %.   General: [name] apparent distress alert and oriented x3 mood and affect normal, dressed appropriately.  Overweight HEENT: Pupils equal, extraocular movements intact  Respiratory: Patient's speak in full sentences and does not appear short of breath  Cardiovascular: [name] lower extremity edema, non tender, [name] erythema  Gait antalgic gait MSK: Knee: Bilateral valgus deformity noted. Large thigh to calf ratio.  Tender to palpation over medial and PF joint line.  ROM lacks the last 5 degrees of extension in the last 10 degrees of flexion bilaterally instability with valgus force.  painful patellar compression. Patellar glide with  moderate crepitus. Patellar and quadriceps tendons unremarkable. Hamstring and quadriceps strength is normal.  After informed written and verbal consent, patient was seated on exam table. Right knee was prepped with alcohol swab and utilizing anterolateral approach, patient's right knee space was injected with 48 mg per 3 mL of Monovisc (sodium hyaluronate) in a prefilled syringe was injected easily into the knee through a 22-gauge needle..Patient tolerated the procedure well without immediate complications.  After informed written and verbal consent, patient was seated on exam table. Left knee was prepped with alcohol swab and utilizing anterolateral approach, patient's left knee space was injected with 40 mg per 3 mL of Monovisc (sodium hyaluronate) in a prefilled syringe was injected easily into the knee through a 22-gauge needle..Patient tolerated the procedure well without immediate complications.    Impression and Recommendations:    The above documentation has been reviewed and is accurate and complete Lyndal Pulley, DO

## 2020-08-08 ENCOUNTER — Ambulatory Visit (INDEPENDENT_AMBULATORY_CARE_PROVIDER_SITE_OTHER): Payer: Medicare Other | Admitting: Family Medicine

## 2020-08-08 ENCOUNTER — Other Ambulatory Visit: Payer: Self-pay

## 2020-08-08 ENCOUNTER — Encounter: Payer: Self-pay | Admitting: Family Medicine

## 2020-08-08 DIAGNOSIS — M170 Bilateral primary osteoarthritis of knee: Secondary | ICD-10-CM

## 2020-08-08 NOTE — Assessment & Plan Note (Signed)
Chronic problem with exacerbation.  He does have known severe arthritic changes of the knees bilaterally.  Patient wants to avoid surgical intervention.  Discussed which activities to doing which wants to avoid.  Patient has had viscosupplementation previously on the right knee and understands what to look for.  Discussed icing regimen and home exercises.  Discussed the importance of weight loss.  Follow-up with me again in 2 to 3 months.

## 2020-08-08 NOTE — Patient Instructions (Signed)
Gel injection for both knees today Will be stiff for a few days IF anything changes give [place] a call If symptoms worsen, please seek medical care at the emergency room See me in 3 months

## 2020-08-09 ENCOUNTER — Telehealth: Payer: Self-pay

## 2020-08-09 NOTE — Chronic Care Management (AMB) (Signed)
° ° °  Chronic Care Management Pharmacy Assistant   Name: Lynn [name]  MRN: [id number] DOB: 03-25-1951  Reason for Encounter: Schedule Follow-Up Appointment  Unsuccessful attempt to reach the patient to schedule a follow-up telephone appointment with clinical pharmacist. [name], CPP. I left a message for the patient to return my call.  April D Calhoun, [place] Pharmacist Assistant [phone]   Follow-Up:  Pharmacist Review

## 2020-09-01 ENCOUNTER — Other Ambulatory Visit: Payer: Self-pay | Admitting: Family Medicine

## 2020-09-03 ENCOUNTER — Telehealth: Payer: Self-pay

## 2020-09-03 ENCOUNTER — Other Ambulatory Visit: Payer: Self-pay | Admitting: Family Medicine

## 2020-09-03 NOTE — Chronic Care Management (AMB) (Signed)
    Chronic Care Management Pharmacy Assistant   Name: Lynn [name]  MRN: [id number] DOB: 18-Nov-1950  Reason for Encounter: Chart Review  Medications: Outpatient Encounter Medications as of 09/03/2020  Medication Sig  . atorvastatin (LIPITOR) 40 MG tablet TAKE 1 TABLET BY MOUTH EVERY DAY  . diclofenac sodium (VOLTAREN) 1 % GEL Apply topically to affected area qid  . fluticasone (FLONASE) 50 MCG/ACT nasal spray INSTILL 2 SPRAYS IN EACH NOSTRIL ONCE A DAY  . furosemide (LASIX) 40 MG tablet TAKE 1 TABLET BY MOUTH EVERY DAY  . gabapentin (NEURONTIN) 300 MG capsule TAKE 1 CAPSULE AT BEDTIME FOR 1 WEEK, THEN INCREASE TO 1 CAP TWICE A DAY FOR 1 WEEK, THEN 1 CAP 3 TIMES A DAY AS NEEDED  . levothyroxine (SYNTHROID) 100 MCG tablet TAKE 1 TABLET BY MOUTH EVERY DAY  . MELATONIN PO Take 1 tablet by mouth at bedtime as needed.  . meloxicam (MOBIC) 15 MG tablet TAKE 1 TABLET BY MOUTH EVERY DAY  . Multiple Vitamin (MULTIVITAMIN) tablet Take 1 tablet by mouth daily.  . Probiotic Product (PROBIOTIC-10) CHEW Chew by mouth.  . promethazine (PHENERGAN) 25 MG tablet Take 1 tablet (25 mg total) by mouth every 6 (six) hours as needed for nausea or vomiting.   No facility-administered encounter medications on file as of 09/03/2020.    Reviewed chart for medication changes.  07/04/2020 OV (sports medicine) Hulan Saas, MD  08/08/2020 OV (sports medicine) Hulan Saas, MD  No medication changes indicated.  April D Calhoun, [place] Pharmacist Assistant [phone]

## 2020-10-23 ENCOUNTER — Telehealth: Payer: Self-pay

## 2020-10-23 NOTE — Chronic Care Management (AMB) (Signed)
    Chronic Care Management Pharmacy Assistant   Name: Clemma Johnsen  MRN: [id number] DOB: [birth year]  Reason for Encounter: General Adherence/ CPP Visit  Reschedule Call  Recent office visits:  No visits noted  Recent consult visits:  No visits noted  Hospital visits:  None in previous 6 months  Medications: Outpatient Encounter Medications as of 10/23/2020  Medication Sig  . atorvastatin (LIPITOR) 40 MG tablet TAKE 1 TABLET BY MOUTH EVERY DAY  . diclofenac sodium (VOLTAREN) 1 % GEL Apply topically to affected area qid  . fluticasone (FLONASE) 50 MCG/ACT nasal spray INSTILL 2 SPRAYS IN EACH NOSTRIL ONCE A DAY  . furosemide (LASIX) 40 MG tablet TAKE 1 TABLET BY MOUTH EVERY DAY  . gabapentin (NEURONTIN) 300 MG capsule TAKE 1 CAPSULE AT BEDTIME FOR 1 WEEK, THEN INCREASE TO 1 CAP TWICE A DAY FOR 1 WEEK, THEN 1 CAP 3 TIMES A DAY AS NEEDED  . levothyroxine (SYNTHROID) 100 MCG tablet TAKE 1 TABLET BY MOUTH EVERY DAY  . MELATONIN PO Take 1 tablet by mouth at bedtime as needed.  . meloxicam (MOBIC) 15 MG tablet TAKE 1 TABLET BY MOUTH EVERY DAY  . Multiple Vitamin (MULTIVITAMIN) tablet Take 1 tablet by mouth daily.  . Probiotic Product (PROBIOTIC-10) CHEW Chew by mouth.  . promethazine (PHENERGAN) 25 MG tablet Take 1 tablet (25 mg total) by mouth every 6 (six) hours as needed for nausea or vomiting.   No facility-administered encounter medications on file as of 10/23/2020.    Have you had any problems recently with your health?  Have you had any problems with your pharmacy?  What issues or side effects are you having with your medications?  What would you like me to pass along to Edison Nasuti Potts,CPP for them to help you with?   What can we do to take care of you better?   Star Rating Drugs: Atorvastatin 40 mg- 90 DS last filled 10/24/20   Several unsuccessful attempts made to contact patient  Wil[facility] CPA, CMA

## 2020-10-24 ENCOUNTER — Other Ambulatory Visit: Payer: Self-pay

## 2020-10-24 ENCOUNTER — Encounter: Payer: Self-pay | Admitting: Family Medicine

## 2020-10-24 ENCOUNTER — Ambulatory Visit (INDEPENDENT_AMBULATORY_CARE_PROVIDER_SITE_OTHER): Payer: Medicare Other | Admitting: Family Medicine

## 2020-10-24 VITALS — BP 138/99 | HR 74 | Temp 97.8°F | Resp 18 | Ht 62.0 in | Wt 197.8 lb

## 2020-10-24 DIAGNOSIS — E038 Other specified hypothyroidism: Secondary | ICD-10-CM | POA: Diagnosis not present

## 2020-10-24 DIAGNOSIS — Z1159 Encounter for screening for other viral diseases: Secondary | ICD-10-CM | POA: Diagnosis not present

## 2020-10-24 DIAGNOSIS — R030 Elevated blood-pressure reading, without diagnosis of hypertension: Secondary | ICD-10-CM | POA: Diagnosis not present

## 2020-10-24 DIAGNOSIS — E785 Hyperlipidemia, unspecified: Secondary | ICD-10-CM

## 2020-10-24 LAB — CBC WITH DIFFERENTIAL/PLATELET
Basophils Absolute: 0.1 10*3/uL (ref 0.0–0.1)
Basophils Relative: 1.2 % (ref 0.0–3.0)
Eosinophils Absolute: 0.1 10*3/uL (ref 0.0–0.7)
Eosinophils Relative: 1.9 % (ref 0.0–5.0)
HCT: 43.1 % (ref 36.0–46.0)
Hemoglobin: 14.6 g/dL (ref 12.0–15.0)
Lymphocytes Relative: 36.1 % (ref 12.0–46.0)
Lymphs Abs: 1.6 10*3/uL (ref 0.7–4.0)
MCHC: 34 g/dL (ref 30.0–36.0)
MCV: 86.4 fl (ref 78.0–100.0)
Monocytes Absolute: 0.4 10*3/uL (ref 0.1–1.0)
Monocytes Relative: 8.6 % (ref 3.0–12.0)
Neutro Abs: 2.3 10*3/uL (ref 1.4–7.7)
Neutrophils Relative %: 52.2 % (ref 43.0–77.0)
Platelets: 193 10*3/uL (ref 150.0–400.0)
RBC: 4.99 Mil/uL (ref 3.87–5.11)
RDW: 12.7 % (ref 11.5–15.5)
WBC: 4.5 10*3/uL (ref 4.0–10.5)

## 2020-10-24 LAB — LIPID PANEL
Cholesterol: 202 mg/dL — ABNORMAL HIGH (ref 0–200)
HDL: 48.8 mg/dL (ref >39.00)
LDL Cholesterol: 119 mg/dL — ABNORMAL HIGH (ref 0–99)
NonHDL: 153.48
Total CHOL/HDL Ratio: 4
Triglycerides: 170 mg/dL — ABNORMAL HIGH (ref 0.0–149.0)
VLDL: 34 mg/dL (ref 0.0–40.0)

## 2020-10-24 LAB — BASIC METABOLIC PANEL
BUN: 16 mg/dL (ref 6–23)
CO2: 30 mEq/L (ref 19–32)
Calcium: 9.1 mg/dL (ref 8.4–10.5)
Chloride: 103 mEq/L (ref 96–112)
Creatinine, Ser: 0.99 mg/dL (ref 0.40–1.20)
GFR: 57.86 mL/min — ABNORMAL LOW (ref >60.00)
Glucose, Bld: 95 mg/dL (ref 70–99)
Potassium: 3.2 mEq/L — ABNORMAL LOW (ref 3.5–5.1)
Sodium: 142 mEq/L (ref 135–145)

## 2020-10-24 LAB — HEPATIC FUNCTION PANEL
ALT: 11 U/L (ref 0–35)
AST: 15 U/L (ref 0–37)
Albumin: 4.2 g/dL (ref 3.5–5.2)
Alkaline Phosphatase: 65 U/L (ref 39–117)
Bilirubin, Direct: 0.2 mg/dL (ref 0.0–0.3)
Total Bilirubin: 1 mg/dL (ref 0.2–1.2)
Total Protein: 6.3 g/dL (ref 6.0–8.3)

## 2020-10-24 LAB — TSH: TSH: 1.45 u[IU]/mL (ref 0.35–4.50)

## 2020-10-24 MED ORDER — ATORVASTATIN CALCIUM 20 MG PO TABS: 20.0000 mg | ORAL_TABLET | Freq: Every day | ORAL | 3 refills | Status: DC

## 2020-10-24 NOTE — Assessment & Plan Note (Signed)
Chronic problem.  Currently asymptomatic on Levothyroxine 100mcg daily.  Check labs.  Adjust meds prn  

## 2020-10-24 NOTE — Patient Instructions (Addendum)
Follow up in 1 month to recheck BP We'll notify you of your lab results and make any changes if needed Continue to work on healthy diet and regular exercise- you can do it! Decrease the Lipitor to 20mg  daily Call with any questions or concerns Stay Safe!  Stay Healthy! ENJOY [place]!!!!

## 2020-10-24 NOTE — Assessment & Plan Note (Signed)
Chronic problem.  Pt has flushing w/ Lipitor 40mg  but doesn't have that problem w/ 20mg  daily.  Prescription changed.

## 2020-10-24 NOTE — Assessment & Plan Note (Signed)
BP was quite elevated this AM but at her last visit was normal at 124/84.  Will keep a close eye on this and if still elevated at next visit will start medication.  Pt expressed understanding and is in agreement w/ plan.

## 2020-10-24 NOTE — Progress Notes (Signed)
   Subjective:    Patient ID: Lynn [name], female    DOB: April 13, 1951, 70 y.o.   MRN: [id number]  HPI Hyperlipidemia- chronic problem, on Lipitor 40mg  daily.  Pt reports facial flushing w/ 40mg .  Does not have similar flushing at 20mg  daily.  No abd pain, N/V.  Hypothyroid- chronic problem, on Levothyroxine 151mcg daily  Pt reports energy is so-so.  Denies changes to skin/hair/nails.  Elevated BP- BP is 150/110.  Last visit was 124/84.  Pt reports BP has run high previously but feels it's 'too early in the morning'.  No CP, SOB, HAs, visual changes, edema.   Review of Systems For ROS see HPI   This visit occurred during the SARS-CoV-2 public health emergency.  Safety protocols were in place, including screening questions prior to the visit, additional usage of staff PPE, and extensive cleaning of exam room while observing appropriate contact time as indicated for disinfecting solutions.       Objective:   Physical Exam Vitals reviewed.  Constitutional:      General: She is not in acute distress.    Appearance: Normal appearance. She is well-developed. She is not ill-appearing.  HENT:     Head: Normocephalic and atraumatic.  Eyes:     Conjunctiva/sclera: Conjunctivae normal.     Pupils: Pupils are equal, round, and reactive to light.  Neck:     Thyroid: No thyromegaly.  Cardiovascular:     Rate and Rhythm: Normal rate and regular rhythm.     Pulses: Normal pulses.     Heart sounds: Normal heart sounds. No murmur heard.   Pulmonary:     Effort: Pulmonary effort is normal. No respiratory distress.     Breath sounds: Normal breath sounds.  Abdominal:     General: There is no distension.     Palpations: Abdomen is soft.     Tenderness: There is no abdominal tenderness.  Musculoskeletal:     Cervical back: Normal range of motion and neck supple.     Right lower leg: No edema.     Left lower leg: No edema.  Lymphadenopathy:     Cervical: No cervical adenopathy.   Skin:    General: Skin is warm and dry.  Neurological:     General: No focal deficit present.     Mental Status: She is alert and oriented to person, place, and time.  Psychiatric:        Mood and Affect: Mood normal.        Behavior: Behavior normal.        Thought Content: Thought content normal.           Assessment & Plan:

## 2020-10-25 ENCOUNTER — Other Ambulatory Visit: Payer: Self-pay

## 2020-10-25 DIAGNOSIS — E876 Hypokalemia: Secondary | ICD-10-CM

## 2020-10-25 LAB — HEPATITIS C ANTIBODY
Hepatitis C Ab: NONREACTIVE
SIGNAL TO CUT-OFF: 0 (ref <1.00)

## 2020-10-25 MED ORDER — POTASSIUM CHLORIDE CRYS ER 20 MEQ PO TBCR: 20.0000 meq | EXTENDED_RELEASE_TABLET | Freq: Every day | ORAL | 3 refills | Status: DC

## 2020-11-05 NOTE — Progress Notes (Signed)
    Chronic Care Management Pharmacy Assistant   Name: Lynn [name]  MRN: [id number] DOB: 05/11/1951  Reason for Encounter: Chart Review  Medications: Outpatient Encounter Medications as of 10/23/2020  Medication Sig Note  . diclofenac sodium (VOLTAREN) 1 % GEL Apply topically to affected area qid   . fluticasone (FLONASE) 50 MCG/ACT nasal spray INSTILL 2 SPRAYS IN EACH NOSTRIL ONCE A DAY   . furosemide (LASIX) 40 MG tablet TAKE 1 TABLET BY MOUTH EVERY DAY   . gabapentin (NEURONTIN) 300 MG capsule TAKE 1 CAPSULE AT BEDTIME FOR 1 WEEK, THEN INCREASE TO 1 CAP TWICE A DAY FOR 1 WEEK, THEN 1 CAP 3 TIMES A DAY AS NEEDED   . levothyroxine (SYNTHROID) 100 MCG tablet TAKE 1 TABLET BY MOUTH EVERY DAY   . MELATONIN PO Take 1 tablet by mouth at bedtime as needed.   . meloxicam (MOBIC) 15 MG tablet TAKE 1 TABLET BY MOUTH EVERY DAY (Patient not taking: Reported on 10/24/2020)   . Multiple Vitamin (MULTIVITAMIN) tablet Take 1 tablet by mouth daily.   . Probiotic Product (PROBIOTIC-10) CHEW Chew by mouth.   . promethazine (PHENERGAN) 25 MG tablet Take 1 tablet (25 mg total) by mouth every 6 (six) hours as needed for nausea or vomiting.   . [DISCONTINUED] atorvastatin (LIPITOR) 40 MG tablet TAKE 1 TABLET BY MOUTH EVERY DAY 10/24/2020: flushing   No facility-administered encounter medications on file as of 10/23/2020.   Reviewed chart for medication changes and adherence.   Recent OV, Consult or Hospital visit:   10/24/20- Annye Asa, MD- chronic conditions addressed, decreased Lipitor from 40 mg to 20 mg daily, follow up 1 month   Recent medication changes indicated:   10/24/20-Decreased lipitor to 20 mg  10/25/20- Started potassium chloride 20 meq   No gaps in adherence identified. Patient has follow up scheduled with pharmacy team. No further action required.  [facility] CPA, CMA

## 2020-11-06 NOTE — Progress Notes (Signed)
[place] [address] [place] Phone: [phone] Subjective:   I Lynn [name] am serving as a Education administrator for Dr. Hulan Saas.  This visit occurred during the SARS-CoV-2 public health emergency.  Safety protocols were in place, including screening questions prior to the visit, additional usage of staff PPE, and extensive cleaning of exam room while observing appropriate contact time as indicated for disinfecting solutions.   I'm seeing this patient by the request  of:  [name], MD  CC: Bilateral knee pain follow-up  NTZ:[id number]   08/08/2020 Chronic problem with exacerbation.  He does have known severe arthritic changes of the knees bilaterally.  Patient wants to avoid surgical intervention.  Discussed which activities to doing which wants to avoid.  Patient has had viscosupplementation previously on the right knee and understands what to look for.  Discussed icing regimen and home exercises.  Discussed the importance of weight loss.  Follow-up with me again in 2 to 3 months.  Update 11/07/2020 Lynn [name] is a 70 y.o. female coming in with complaint of B knee pain. Monovisc given last visit. Patient states her knees are about the same today.  Patient does state that the viscosupplementation did help initially.  Now having worsening pain again.  Increasing instability.  Reviewed patient's previous x-rays which were taken in March 2021 showing severe bone-on-bone medial compartment osteoarthritic changes bilaterally.    Past Medical History:  Diagnosis Date  . Allergy   . Arthritis    left knee  . Basal cell carcinoma of skin 10/14/1994   Right breast  . Basal cell carcinoma of skin 12/07/2011   Left inner elbow - TX p BX  . Hyperlipidemia   . Hypothyroidism   . Squamous cell carcinoma of skin 12/07/2011   Left lower leg - TX p BX  . Squamous cell carcinoma of skin 02/02/2017   Left anterior thigh - CX3 + 5FU    Past Surgical History:  Procedure Laterality Date  . c-section 1983    . COLONOSCOPY    . KNEE ARTHROSCOPY    . PARTIAL HYSTERECTOMY    . POLYPECTOMY    . TONSILLECTOMY    . TUBAL LIGATION     Social History   Socioeconomic History  . Marital status: Widowed    Spouse name: Not on file  . Number of children: Not on file  . Years of education: Not on file  . Highest education level: Not on file  Occupational History  . Occupation: retired  Tobacco Use  . Smoking status: Never Smoker  . Smokeless tobacco: Never Used  Vaping Use  . Vaping Use: Never used  Substance and Sexual Activity  . Alcohol use: No  . Drug use: No  . Sexual activity: Not on file  Other Topics Concern  . Not on file  Social History Narrative  . Not on file   Social Determinants of Health   Financial Resource Strain: Low Risk   . Difficulty of Paying Living Expenses: Not hard at all  Food Insecurity: No Food Insecurity  . Worried About Charity fundraiser in the Last Year: Never true  . Ran Out of Food in the Last Year: Never true  Transportation Needs: No Transportation Needs  . Lack of Transportation (Medical): No  . Lack of Transportation (Non-Medical): No  Physical Activity: Insufficiently Active  . Days of Exercise per Week: 3 days  . Minutes of Exercise per Session: 30  min  Stress: No Stress Concern Present  . Feeling of Stress : Not at all  Social Connections: Moderately Integrated  . Frequency of Communication with Friends and Family: Three times a week  . Frequency of Social Gatherings with Friends and Family: Three times a week  . Attends Religious Services: More than 4 times per year  . Active Member of Clubs or Organizations: Yes  . Attends Archivist Meetings: More than 4 times per year  . Marital Status: Widowed   Allergies  Allergen Reactions  . Sulfa Antibiotics Nausea Only  . Brimonidine Tartrate    Family History  Problem Relation Age of Onset  . Colon  cancer Father   . Cancer Father        skin  . Cancer Paternal Grandfather        skin  . Colon polyps Sister   . Colon polyps Sister   . Colon polyps Sister   . Colon polyps Sister   . Pancreatic cancer Neg Hx   . Stomach cancer Neg Hx   . Esophageal cancer Neg Hx   . Rectal cancer Neg Hx     Current Outpatient Medications (Endocrine & Metabolic):  .  levothyroxine (SYNTHROID) 100 MCG tablet, TAKE 1 TABLET BY MOUTH EVERY DAY  Current Outpatient Medications (Cardiovascular):  .  atorvastatin (LIPITOR) 20 MG tablet, Take 1 tablet (20 mg total) by mouth daily. .  furosemide (LASIX) 40 MG tablet, TAKE 1 TABLET BY MOUTH EVERY DAY  Current Outpatient Medications (Respiratory):  .  fluticasone (FLONASE) 50 MCG/ACT nasal spray, INSTILL 2 SPRAYS IN EACH NOSTRIL ONCE A DAY .  promethazine (PHENERGAN) 25 MG tablet, Take 1 tablet (25 mg total) by mouth every 6 (six) hours as needed for nausea or vomiting.    Current Outpatient Medications (Other):  .  gabapentin (NEURONTIN) 300 MG capsule, TAKE 1 CAPSULE AT BEDTIME FOR 1 WEEK, THEN INCREASE TO 1 CAP TWICE A DAY FOR 1 WEEK, THEN 1 CAP 3 TIMES A DAY AS NEEDED .  Multiple Vitamin (MULTIVITAMIN) tablet, Take 1 tablet by mouth daily. .  potassium chloride SA (KLOR-CON) 20 MEQ tablet, Take 1 tablet (20 mEq total) by mouth daily. .  Probiotic Product (PROBIOTIC-10) CHEW, Chew by mouth.   Reviewed prior external information including notes and imaging from  primary care provider As well as notes that were available from care everywhere and other healthcare systems.  Past medical history, social, surgical and family history all reviewed in electronic medical record.  No pertanent information unless stated regarding to the chief complaint.   Review of Systems:  No headache, visual changes, nausea, vomiting, diarrhea, constipation, dizziness, abdominal pain, skin rash, fevers, chills, night sweats, weight loss, swollen lymph nodes, body  aches,chest pain, shortness of breath, mood changes. POSITIVE muscle aches, joint swelling  Objective  Blood pressure (!) 150/100, pulse 97, height 5\' 2"  (1.575 m), weight 194 lb (88 kg), SpO2 97 %.   General: No apparent distress alert and oriented x3 mood and affect normal, dressed appropriately.   Antalgic gait noted. Knee:bilaterally  Varus deformity noted. Large thigh to calf ratio.  Tender to palpation over medial and PF joint line.  ROM lacking 5 degrees of extension bilaterally. instability with valgus force.  painful patellar compression. Patellar glide with moderate crepitus. Patellar and quadriceps tendons unremarkable. Hamstring and quadriceps strength is normal.  After informed written and verbal consent, patient was seated on exam table. Right knee was prepped with alcohol swab and  utilizing anterolateral approach, patient's right knee space was injected with 4:1  marcaine 0.5%: Kenalog 40mg /dL. Patient tolerated the procedure well without immediate complications.  After informed written and verbal consent, patient was seated on exam table. Left knee was prepped with alcohol swab and utilizing anterolateral approach, patient's left knee space was injected with 4:1  marcaine 0.5%: Kenalog 40mg /dL. Patient tolerated the procedure well without immediate complications.   Impression and Recommendations:     The above documentation has been reviewed and is accurate and complete Lyndal Pulley, DO

## 2020-11-07 ENCOUNTER — Other Ambulatory Visit: Payer: Self-pay

## 2020-11-07 ENCOUNTER — Ambulatory Visit (INDEPENDENT_AMBULATORY_CARE_PROVIDER_SITE_OTHER): Payer: Medicare Other | Admitting: Family Medicine

## 2020-11-07 ENCOUNTER — Encounter: Payer: Self-pay | Admitting: Family Medicine

## 2020-11-07 DIAGNOSIS — M170 Bilateral primary osteoarthritis of knee: Secondary | ICD-10-CM

## 2020-11-07 NOTE — Assessment & Plan Note (Signed)
Patient has done relatively well at this moment with his chronic problem but is having exacerbation.  Patient wants to avoid surgical intervention still for quite some time.  Discussed with her though that depending on how patient responds to these injections do not know how long this will last.  Patient will follow up again in 3 months to have viscosupplementation again and we will try to get approval.  Patient knows worsening symptoms to call us immediately.  Continue to work on weight, icing regimen, home exercises otherwise.

## 2020-11-07 NOTE — Patient Instructions (Addendum)
Good to see you We will get gel approval Enjoy the beach steroid injections today See me again in 3 months

## 2020-11-08 ENCOUNTER — Other Ambulatory Visit: Payer: Self-pay

## 2020-11-08 ENCOUNTER — Other Ambulatory Visit (INDEPENDENT_AMBULATORY_CARE_PROVIDER_SITE_OTHER): Payer: Medicare Other

## 2020-11-08 DIAGNOSIS — E876 Hypokalemia: Secondary | ICD-10-CM | POA: Diagnosis not present

## 2020-11-11 LAB — BASIC METABOLIC PANEL
BUN: 21 mg/dL (ref 6–23)
CO2: 21 mEq/L (ref 19–32)
Calcium: 9.2 mg/dL (ref 8.4–10.5)
Chloride: 105 mEq/L (ref 96–112)
Creatinine, Ser: 1.11 mg/dL (ref 0.40–1.20)
GFR: 50.42 mL/min — ABNORMAL LOW (ref >60.00)
Glucose, Bld: 172 mg/dL — ABNORMAL HIGH (ref 70–99)
Potassium: 3.9 mEq/L (ref 3.5–5.1)
Sodium: 141 mEq/L (ref 135–145)

## 2020-11-18 ENCOUNTER — Telehealth: Payer: Self-pay

## 2020-11-18 NOTE — Chronic Care Management (AMB) (Signed)
    Chronic Care Management Pharmacy Assistant   Name: Lynn [name]  MRN: [id number] DOB: [birth year]  Reason for Encounter: General Patient Call  Recent office visits:  10/24/20- Annye Asa, MD-  seen for chronic conditions, decreased lipitor from 40 mg daily to 20 mg daily, labs ordered, follow up 1 month Order from labs: Started potassium chloride 20 meq daily   Recent consult visits:  11/07/20-  Hulan Saas, DO (Sports Medicine)- seen for bilateral knee pain follow-up  Hospital visits:  None in previous 6 months  Medications: Outpatient Encounter Medications as of 11/18/2020  Medication Sig   atorvastatin (LIPITOR) 20 MG tablet Take 1 tablet (20 mg total) by mouth daily.   fluticasone (FLONASE) 50 MCG/ACT nasal spray INSTILL 2 SPRAYS IN EACH NOSTRIL ONCE A DAY   furosemide (LASIX) 40 MG tablet TAKE 1 TABLET BY MOUTH EVERY DAY   gabapentin (NEURONTIN) 300 MG capsule TAKE 1 CAPSULE AT BEDTIME FOR 1 WEEK, THEN INCREASE TO 1 CAP TWICE A DAY FOR 1 WEEK, THEN 1 CAP 3 TIMES A DAY AS NEEDED   levothyroxine (SYNTHROID) 100 MCG tablet TAKE 1 TABLET BY MOUTH EVERY DAY   Multiple Vitamin (MULTIVITAMIN) tablet Take 1 tablet by mouth daily.   potassium chloride SA (KLOR-CON) 20 MEQ tablet Take 1 tablet (20 mEq total) by mouth daily.   Probiotic Product (PROBIOTIC-10) CHEW Chew by mouth.   promethazine (PHENERGAN) 25 MG tablet Take 1 tablet (25 mg total) by mouth every 6 (six) hours as needed for nausea or vomiting.   No facility-administered encounter medications on file as of 11/18/2020.   I spoke with Lynn [name] today and she is doing well. She is on vacation at the beach for the next two weeks and is enjoying herself thus far. She did have persistent headaches last week and believed it had something to do with her medications. She medicated with tylenol every other day and didn't see much relief. She is unsure on what caused or relieved her headaches, but she but says now she is no  longer experiencing any. Lynn [name] had no other concerns and we were able to schedule a CPP phone visit on 06/29 at 4pm after she gets back from vacation.   Star Rating Drugs: Atorvastatin 40 mg- 90 DS last filled 10/24/20  [facility] CPA, CMA

## 2020-12-02 ENCOUNTER — Ambulatory Visit: Payer: Medicare Other | Admitting: Family Medicine

## 2020-12-02 DIAGNOSIS — H43813 Vitreous degeneration, bilateral: Secondary | ICD-10-CM | POA: Diagnosis not present

## 2020-12-02 DIAGNOSIS — H2513 Age-related nuclear cataract, bilateral: Secondary | ICD-10-CM | POA: Diagnosis not present

## 2020-12-04 ENCOUNTER — Encounter: Payer: Self-pay | Admitting: *Deleted

## 2020-12-04 ENCOUNTER — Telehealth: Payer: Medicare Other

## 2020-12-11 ENCOUNTER — Ambulatory Visit: Payer: Medicare Other | Admitting: Family Medicine

## 2020-12-17 ENCOUNTER — Other Ambulatory Visit: Payer: Self-pay | Admitting: Family Medicine

## 2020-12-17 DIAGNOSIS — Z1231 Encounter for screening mammogram for malignant neoplasm of breast: Secondary | ICD-10-CM

## 2020-12-20 ENCOUNTER — Other Ambulatory Visit: Payer: Self-pay

## 2020-12-20 ENCOUNTER — Ambulatory Visit
Admission: RE | Admit: 2020-12-20 | Discharge: 2020-12-20 | Disposition: A | Discharge status: Home / Self Care | Payer: Medicare Other | Source: Ambulatory Visit | Attending: Family Medicine | Admitting: Family Medicine

## 2020-12-20 DIAGNOSIS — Z1231 Encounter for screening mammogram for malignant neoplasm of breast: Secondary | ICD-10-CM

## 2020-12-27 ENCOUNTER — Other Ambulatory Visit: Payer: Self-pay | Admitting: Family Medicine

## 2020-12-28 ENCOUNTER — Other Ambulatory Visit: Payer: Self-pay | Admitting: Family Medicine

## 2020-12-30 ENCOUNTER — Ambulatory Visit (INDEPENDENT_AMBULATORY_CARE_PROVIDER_SITE_OTHER): Payer: Medicare Other | Admitting: *Deleted

## 2020-12-30 ENCOUNTER — Telehealth (INDEPENDENT_AMBULATORY_CARE_PROVIDER_SITE_OTHER): Payer: Medicare Other | Admitting: Family Medicine

## 2020-12-30 VITALS — Temp 97.5°F

## 2020-12-30 DIAGNOSIS — Z8709 Personal history of other diseases of the respiratory system: Secondary | ICD-10-CM | POA: Diagnosis not present

## 2020-12-30 DIAGNOSIS — Z8619 Personal history of other infectious and parasitic diseases: Secondary | ICD-10-CM

## 2020-12-30 DIAGNOSIS — R059 Cough, unspecified: Secondary | ICD-10-CM

## 2020-12-30 DIAGNOSIS — J300 Vasomotor rhinitis: Secondary | ICD-10-CM | POA: Diagnosis not present

## 2020-12-30 DIAGNOSIS — R0981 Nasal congestion: Secondary | ICD-10-CM

## 2020-12-30 DIAGNOSIS — Z0000 Encounter for general adult medical examination without abnormal findings: Secondary | ICD-10-CM

## 2020-12-30 MED ORDER — AMOXICILLIN-POT CLAVULANATE 875-125 MG PO TABS: 1.0000 | ORAL_TABLET | Freq: Two times a day (BID) | ORAL | 0 refills | Status: DC

## 2020-12-30 MED ORDER — AZELASTINE HCL 0.1 % NA SOLN: 1.0000 | Freq: Two times a day (BID) | NASAL | 3 refills | Status: DC | PRN

## 2020-12-30 MED ORDER — FLUCONAZOLE 150 MG PO TABS: 150.0000 mg | ORAL_TABLET | Freq: Once | ORAL | 0 refills | Status: AC

## 2020-12-30 NOTE — Patient Instructions (Signed)
Good talking to you today.  It certainly is possible that you have another sinus infection as some symptoms last week that worsened again this week.  As we discussed you may also have a component of "vasomotor rhinitis", especially with change in congestion from going from hot to cold environments.  I sent a new nasal spray can be used twice per day as needed to see if that may help those symptoms.    For sinus infection I sent the antibiotic to your pharmacy as well as Diflucan if needed for signs or symptoms of yeast infection after taking that medication.  Continue saline nasal spray, drinking fluids, rest as needed.  I do recommend repeat COVID-19 test tomorrow or Wednesday morning.  I suspect it to be negative but if that is positive let us know and we can discuss other treatments.  Take care.

## 2020-12-30 NOTE — Patient Instructions (Addendum)
[name] , Thank you for taking time to come for your Medicare Wellness Visit. I appreciate your ongoing commitment to your health goals. Please review the following plan we discussed and let me know if I can assist you in the future.   Screening recommendations/referrals: Colonoscopy: up to date Mammogram: up to date Bone Density: Education provided Recommended yearly ophthalmology/optometry visit for glaucoma screening and checkup Recommended yearly dental visit for hygiene and checkup  Vaccinations: Influenza vaccine: up to date Pneumococcal vaccine: education provided Tdap vaccine: up to date Shingles vaccine: up to date    Advanced directives: copy requested  Conditions/risks identified: na  Next appointment: 01-08-2021  @ [employer] [facility]   Preventive Care 65 Years and Older, Female Preventive care refers to lifestyle choices and visits with your health care provider that can promote health and wellness. What does preventive care include? A yearly physical exam. This is also called an annual well check. Dental exams once or twice a year. Routine eye exams. Ask your health care provider how often you should have your eyes checked. Personal lifestyle choices, including: Daily care of your teeth and gums. Regular physical activity. Eating a healthy diet. Avoiding tobacco and drug use. Limiting alcohol use. Practicing safe sex. Taking low-dose aspirin every day. Taking vitamin and mineral supplements as recommended by your health care provider. What happens during an annual well check? The services and screenings done by your health care provider during your annual well check will depend on your age, overall health, lifestyle risk factors, and family history of disease. Counseling  Your health care provider may ask you questions about your: Alcohol use. Tobacco use. Drug use. Emotional well-being. Home and relationship well-being. Sexual activity. Eating  habits. History of falls. Memory and ability to understand (cognition). Work and work Statistician. Reproductive health. Screening  You may have the following tests or measurements: Height, weight, and BMI. Blood pressure. Lipid and cholesterol levels. These may be checked every 5 years, or more frequently if you are over 70 years old. Skin check. Lung cancer screening. You may have this screening every year starting at age 15 if you have a 30-pack-year history of smoking and currently smoke or have quit within the past 15 years. Fecal occult blood test (FOBT) of the stool. You may have this test every year starting at age 22. Flexible sigmoidoscopy or colonoscopy. You may have a sigmoidoscopy every 5 years or a colonoscopy every 10 years starting at age 8. Hepatitis C blood test. Hepatitis B blood test. Sexually transmitted disease (STD) testing. Diabetes screening. This is done by checking your blood sugar (glucose) after you have not eaten for a while (fasting). You may have this done every 1-3 years. Bone density scan. This is done to screen for osteoporosis. You may have this done starting at age 11. Mammogram. This may be done every 1-2 years. Talk to your health care provider about how often you should have regular mammograms. Talk with your health care provider about your test results, treatment options, and if necessary, the need for more tests. Vaccines  Your health care provider may recommend certain vaccines, such as: Influenza vaccine. This is recommended every year. Tetanus, diphtheria, and acellular pertussis (Tdap, Td) vaccine. You may need a Td booster every 10 years. Zoster vaccine. You may need this after age 79. Pneumococcal 13-valent conjugate (PCV13) vaccine. One dose is recommended after age 75. Pneumococcal polysaccharide (PPSV23) vaccine. One dose is recommended after age 7. Talk to your  health care provider about which screenings and vaccines you need and how  often you need them. This information is not intended to replace advice given to you by your health care provider. Make sure you discuss any questions you have with your health care provider. Document Released: 06/21/2015 Document Revised: 02/12/2016 Document Reviewed: 03/26/2015 Elsevier Interactive Patient Education  2017 [employer] Prevention in the Home Falls can cause injuries. They can happen to people of all ages. There are many things you can do to make your home safe and to help prevent falls. What can I do on the outside of my home? Regularly fix the edges of walkways and driveways and fix any cracks. Remove anything that might make you trip as you walk through a door, such as a raised step or threshold. Trim any bushes or trees on the path to your home. Use bright outdoor lighting. Clear any walking paths of anything that might make someone trip, such as rocks or tools. Regularly check to see if handrails are loose or broken. Make sure that both sides of any steps have handrails. Any raised decks and porches should have guardrails on the edges. Have any leaves, snow, or ice cleared regularly. Use sand or salt on walking paths during winter. Clean up any spills in your garage right away. This includes oil or grease spills. What can I do in the bathroom? Use night lights. Install grab bars by the toilet and in the tub and shower. Do not use towel bars as grab bars. Use non-skid mats or decals in the tub or shower. If you need to sit down in the shower, use a plastic, non-slip stool. Keep the floor dry. Clean up any water that spills on the floor as soon as it happens. Remove soap buildup in the tub or shower regularly. Attach bath mats securely with double-sided non-slip rug tape. Do not have throw rugs and other things on the floor that can make you trip. What can I do in the bedroom? Use night lights. Make sure that you have a light by your bed that is easy to  reach. Do not use any sheets or blankets that are too big for your bed. They should not hang down onto the floor. Have a firm chair that has side arms. You can use this for support while you get dressed. Do not have throw rugs and other things on the floor that can make you trip. What can I do in the kitchen? Clean up any spills right away. Avoid walking on wet floors. Keep items that you use a lot in easy-to-reach places. If you need to reach something above you, use a strong step stool that has a grab bar. Keep electrical cords out of the way. Do not use floor polish or wax that makes floors slippery. If you must use wax, use non-skid floor wax. Do not have throw rugs and other things on the floor that can make you trip. What can I do with my stairs? Do not leave any items on the stairs. Make sure that there are handrails on both sides of the stairs and use them. Fix handrails that are broken or loose. Make sure that handrails are as long as the stairways. Check any carpeting to make sure that it is firmly attached to the stairs. Fix any carpet that is loose or worn. Avoid having throw rugs at the top or bottom of the stairs. If you do have throw rugs, attach them  to the floor with carpet tape. Make sure that you have a light switch at the top of the stairs and the bottom of the stairs. If you do not have them, ask someone to add them for you. What else can I do to help prevent falls? Wear shoes that: Do not have high heels. Have rubber bottoms. Are comfortable and fit you well. Are closed at the toe. Do not wear sandals. If you use a stepladder: Make sure that it is fully opened. Do not climb a closed stepladder. Make sure that both sides of the stepladder are locked into place. Ask someone to hold it for you, if possible. Clearly mark and make sure that you can see: Any grab bars or handrails. First and last steps. Where the edge of each step is. Use tools that help you move  around (mobility aids) if they are needed. These include: Canes. Walkers. Scooters. Crutches. Turn on the lights when you go into a dark area. Replace any light bulbs as soon as they burn out. Set up your furniture so you have a clear path. Avoid moving your furniture around. If any of your floors are uneven, fix them. If there are any pets around you, be aware of where they are. Review your medicines with your doctor. Some medicines can make you feel dizzy. This can increase your chance of falling. Ask your doctor what other things that you can do to help prevent falls. This information is not intended to replace advice given to you by your health care provider. Make sure you discuss any questions you have with your health care provider. Document Released: 03/21/2009 Document Revised: 10/31/2015 Document Reviewed: 06/29/2014 Elsevier Interactive Patient Education  2017 [employer].
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
## 2020-12-30 NOTE — Progress Notes (Signed)
Subjective:   Kia Carandang is a 70 y.o. female who presents for Medicare Annual (Subsequent) preventive examination.  I connected with  Isaiah Laporta Farha on 12/30/20 by a telephone enabled telemedicine application and verified that I am speaking with the correct person using two identifiers.   I discussed the limitations of evaluation and management by telemedicine. The patient expressed understanding and agreed to proceed.   Review of Systems    na Cardiac Risk Factors include: advanced age (>42mn, >>92women);dyslipidemia;hypertension     Objective:    Today's Vitals   12/30/20 1501  PainSc: 7    There is no height or weight on file to calculate BMI.  Advanced Directives 12/30/2020 12/26/2019 10/22/2017 06/09/2017  Does Patient Have a Medical Advance Directive? Yes Yes No Yes  Type of [place]  Copy of [place]? No - copy requested Yes - validated most recent copy scanned in chart (See row information) - Yes  Would patient like information on creating a medical advance directive? - - No - Patient declined -    Current Medications (verified) Outpatient Encounter Medications as of 12/30/2020  Medication Sig   atorvastatin (LIPITOR) 20 MG tablet Take 1 tablet (20 mg total) by mouth daily.   fluticasone (FLONASE) 50 MCG/ACT nasal spray INSTILL 2 SPRAYS IN EACH NOSTRIL ONCE A DAY   furosemide (LASIX) 40 MG tablet TAKE 1 TABLET BY MOUTH EVERY DAY   gabapentin (NEURONTIN) 300 MG capsule TAKE 1 CAPSULE AT BEDTIME FOR 1 WEEK, THEN INCREASE TO 1 CAP TWICE A DAY FOR 1 WEEK, THEN 1 CAP 3 TIMES A DAY AS NEEDED   levothyroxine (SYNTHROID) 100 MCG tablet TAKE 1 TABLET BY MOUTH EVERY DAY   Multiple Vitamin (MULTIVITAMIN) tablet Take 1 tablet by mouth daily.   potassium chloride SA (KLOR-CON) 20 MEQ tablet Take 1 tablet (20 mEq total) by  mouth daily.   Probiotic Product (PROBIOTIC-10) CHEW Chew by mouth.   promethazine (PHENERGAN) 25 MG tablet Take 1 tablet (25 mg total) by mouth every 6 (six) hours as needed for nausea or vomiting.   No facility-administered encounter medications on file as of 12/30/2020.    Allergies (verified) Sulfa antibiotics and Brimonidine tartrate   History: Past Medical History:  Diagnosis Date   Allergy    Arthritis    left knee   Basal cell carcinoma of skin 10/14/1994   Right breast   Basal cell carcinoma of skin 12/07/2011   Left inner elbow - TX p BX   Hyperlipidemia    Hypothyroidism    Squamous cell carcinoma of skin 12/07/2011   Left lower leg - TX p BX   Squamous cell carcinoma of skin 02/02/2017   Left anterior thigh - CX3 + 5FU   Past Surgical History:  Procedure Laterality Date   c-section 1983     COLONOSCOPY     KNEE ARTHROSCOPY     PARTIAL HYSTERECTOMY     POLYPECTOMY     TONSILLECTOMY     TUBAL LIGATION     Family History  Problem Relation Age of Onset   Colon cancer Father    Cancer Father        skin   Cancer Paternal Grandfather        skin   Colon polyps Sister    Colon polyps Sister    Colon polyps Sister  Colon polyps Sister    Pancreatic cancer Neg Hx    Stomach cancer Neg Hx    Esophageal cancer Neg Hx    Rectal cancer Neg Hx    Social History   Socioeconomic History   Marital status: Widowed    Spouse name: Not on file   Number of children: Not on file   Years of education: Not on file   Highest education level: Not on file  Occupational History   Occupation: retired  Tobacco Use   Smoking status: Never   Smokeless tobacco: Never  Vaping Use   Vaping Use: Never used  Substance and Sexual Activity   Alcohol use: No   Drug use: No   Sexual activity: Not on file  Other Topics Concern   Not on file  Social History Narrative   Not on file   Social Determinants of Health   Financial Resource Strain: Low Risk    Difficulty of  Paying Living Expenses: Not hard at all  Food Insecurity: No Food Insecurity   Worried About Charity fundraiser in the Last Year: Never true   [place] in the Last Year: Never true  Transportation Needs: No Transportation Needs   Lack of Transportation (Medical): No   Lack of Transportation (Non-Medical): No  Physical Activity: Insufficiently Active   Days of Exercise per Week: 3 days   Minutes of Exercise per Session: 30 min  Stress: No Stress Concern Present   Feeling of Stress : Not at all  Social Connections: Moderately Integrated   Frequency of Communication with Friends and Family: More than three times a week   Frequency of Social Gatherings with Friends and Family: More than three times a week   Attends Religious Services: More than 4 times per year   Active Member of [employer] or Organizations: Not on file   Attends Archivist Meetings: 1 to 4 times per year   Marital Status: Widowed    Tobacco Counseling Counseling given: Not Answered   Clinical Intake:  Pre-visit preparation completed: Yes  Pain : 0-10 Pain Score: 7  Pain Type: Acute pain Pain Location: Face Pain Onset: Today Pain Frequency: Constant     Diabetes: No  How often do you need to have someone help you when you read instructions, pamphlets, or other written materials from your doctor or pharmacy?: 1 - Never  Diabetic?   NA  Interpreter Needed?: No  Information entered by :: Leroy Kennedy LPN   Activities of Daily Living In your present state of health, do you have any difficulty performing the following activities: 12/30/2020 10/24/2020  Hearing? N N  Vision? N N  Difficulty concentrating or making decisions? N N  Walking or climbing stairs? N N  Dressing or bathing? N N  Doing errands, shopping? N N  Preparing Food and eating ? N -  Using the Toilet? N -  In the past six months, have you accidently leaked urine? N -  Do you have problems with loss of bowel control? N -   Managing your Finances? N -  Housekeeping or managing your Housekeeping? N -  Some recent data might be hidden    Patient Care Team: [name], MD as PCP - General (Family Medicine) Lavonna Monarch, MD as Consulting Physician (Dermatology) Inda Castle, MD (Inactive) as Consulting Physician (Gastroenterology) [name], [facility] as Pharmacist (Pharmacist)  Indicate any recent Medical Services you may have received from other than [facility] providers  in the past year (date may be approximate).     Assessment:   This is a routine wellness examination for Teena.  Hearing/Vision screen No results found.  Dietary issues and exercise activities discussed: Current Exercise Habits: Home exercise routine, Type of exercise: walking, Time (Minutes): 25, Frequency (Times/Week): 3, Weekly Exercise (Minutes/Week): 75, Intensity: Mild   Goals Addressed             This Visit's Progress    Patient Stated       Loose 10 pounds       Depression Screen PHQ 2/9 Scores 12/30/2020 10/24/2020 12/26/2019 05/09/2019 10/24/2018 04/27/2018 06/09/2017  PHQ - 2 Score 0 0 0 0 0 0 0  PHQ- 9 Score - 0 - 0 - 0 -    Fall Risk Fall Risk  12/30/2020 10/24/2020 12/26/2019 05/09/2019 10/24/2018  Falls in the past year? 0 0 0 0 0  Number falls in past yr: 0 0 0 0 0  Injury with Fall? 0 0 0 0 0  Risk for fall due to : - No Fall Risks - - -  Follow up Falls evaluation completed;Falls prevention discussed - Falls prevention discussed Falls evaluation completed -    FALL RISK PREVENTION PERTAINING TO THE HOME:  Any stairs in or around the home? Yes  If so, are there any without handrails? Yes  Home free of loose throw rugs in walkways, pet beds, electrical cords, etc? Yes  Adequate lighting in your home to reduce risk of falls? Yes   ASSISTIVE DEVICES UTILIZED TO PREVENT FALLS:  Life alert? No  Use of a cane, walker or w/c? No  Grab bars in the bathroom? Yes  Shower chair or bench in shower? No   Elevated toilet seat or a handicapped toilet? No   TIMED UP AND GO:  Was the test performed? No .    Cognitive Function:  Normal cognitive status assessed by direct observation by this Nurse Health Advisor. No abnormalities found.       6CIT Screen 12/26/2019  What Year? 0 points  What month? 0 points  What time? 0 points  Count back from 20 0 points  Months in reverse 0 points  Repeat phrase 0 points  Total Score 0    Immunizations Immunization History  Administered Date(s) Administered   Fluad Quad(high Dose 65+) 02/17/2019, 03/11/2020   Influenza, High Dose Seasonal PF 03/07/2018   Influenza, Seasonal, Injecte, Preservative Fre 03/08/2012, 03/13/2013, 03/15/2014, 03/19/2015   Influenza,inj,Quad PF,6+ Mos 04/02/2016, 02/02/2017   Moderna Sars-Covid-2 Vaccination 07/26/2019, 08/17/2019   Pneumococcal Conjugate-13 10/24/2015   Pneumococcal Polysaccharide-23 06/09/2017   Td 07/25/2018   Tdap 10/24/2011   Zoster Recombinat (Shingrix) 02/03/2018, 06/22/2018   Zoster, Live 10/24/2014    TDAP status: Up to date  Flu Vaccine status: Up to date  Pneumococcal vaccine status: Due, Education has been provided regarding the importance of this vaccine. Advised may receive this vaccine at local pharmacy or Health Dept. Aware to provide a copy of the vaccination record if obtained from local pharmacy or Health Dept. Verbalized acceptance and understanding.  Covid-19 vaccine status: Information provided on how to obtain vaccines.   Qualifies for Shingles Vaccine? No   Zostavax completed Yes   Shingrix Completed?: Yes  Screening Tests Health Maintenance  Topic Date Due   COVID-19 Vaccine (3 - Mixed Product risk series) 09/14/2019   INFLUENZA VACCINE  01/06/2021   MAMMOGRAM  12/20/2021   COLONOSCOPY (Pts 45-49yr Insurance coverage will need to be  confirmed)  02/23/2024   TETANUS/TDAP  07/25/2028   DEXA SCAN  Completed   Hepatitis C Screening  Completed   PNA vac Low Risk  Adult  Completed   Zoster Vaccines- Shingrix  Completed   HPV VACCINES  Aged Out    Health Maintenance  Health Maintenance Due  Topic Date Due   COVID-19 Vaccine (3 - Mixed Product risk series) 09/14/2019    Colorectal cancer screening: Type of screening: Colonoscopy. Completed  . Repeat every 5 years  Mammogram status: Completed  . Repeat every year  Bone Density status: Ordered  . Pt provided with contact info and advised to call to schedule appt.  Lung Cancer Screening: (Low Dose CT Chest recommended if Age 9-80 years, 30 pack-year currently smoking OR have quit w/in 15years.) does not qualify.   Lung Cancer Screening Referral: na  Additional Screening:  Hepatitis C Screening: does not qualify; Completed   Vision Screening: Recommended annual ophthalmology exams for early detection of glaucoma and other disorders of the eye. Is the patient up to date with their annual eye exam?  Yes  Who is the provider or what is the name of the office in which the patient attends annual eye exams? [place] If pt is not established with a provider, would they like to be referred to a provider to establish care? No .   Dental Screening: Recommended annual dental exams for proper oral hygiene  Community Resource Referral / Chronic Care Management: CRR required this visit?  No   CCM required this visit?  No      Plan:     I have personally reviewed and noted the following in the patient's chart:   Medical and social history Use of alcohol, tobacco or illicit drugs  Current medications and supplements including opioid prescriptions.  Functional ability and status Nutritional status Physical activity Advanced directives List of other physicians Hospitalizations, surgeries, and ER visits in previous 12 months Vitals Screenings to include cognitive, depression, and falls Referrals and appointments  In addition, I have reviewed and discussed with patient certain  preventive protocols, quality metrics, and best practice recommendations. A written personalized care plan for preventive services as well as general preventive health recommendations were provided to patient.     Leroy Kennedy, LPN   [id number]   Nurse Notes: na

## 2020-12-31 ENCOUNTER — Telehealth: Payer: Self-pay | Admitting: Family Medicine

## 2020-12-31 NOTE — Telephone Encounter (Signed)
Patient had a my chart visit with Dr. Carlota Raspberry on 12/30/2020 - he told her to call back if she tested positive for covid - She tested positive today.  Please advise

## 2021-01-01 ENCOUNTER — Telehealth: Payer: Self-pay

## 2021-01-01 DIAGNOSIS — U071 COVID-19: Secondary | ICD-10-CM

## 2021-01-01 MED ORDER — NIRMATRELVIR/RITONAVIR (PAXLOVID) TABLET (RENAL DOSING): 2.0000 | ORAL_TABLET | Freq: Two times a day (BID) | ORAL | 0 refills | Status: AC

## 2021-01-01 NOTE — Telephone Encounter (Signed)
Pt tested positive for covid yesterday and she is having congestion and sever headaches she has taken, Tylenol, Ibuprofen  and nothing is helping the headache is there anything she can do to get relief for the headache.   Pt call back [phone]

## 2021-01-01 NOTE — Telephone Encounter (Signed)
Noted.  See office visit on July 25.  At that time had worse sinus symptoms starting on approximately the 24th. Called patient.  positive covid test yesterday.  Has been having a headache, cough, chest congestion. Dyspnea with activity only -not at rest. Drinking fluids ok, no confusion.  Prior headache has improved. Less tonight - treated with tylenol.  Feeling slightly better. Antivirals discussed.   Lab Results  Component Value Date   CREATININE 1.11 11/08/2020  GFR 50 in June Advised to stop Atorvastatin for 1 week. Renal dosing of paxlovid sent to pharmacy. Possible side effects and risk of meds discussed with all questions answered.  Isolation/masking precautions discussed per CDC guidelines as well as ER precautions given.  Understanding expressed.

## 2021-01-01 NOTE — Telephone Encounter (Signed)
See other phone note

## 2021-01-02 ENCOUNTER — Other Ambulatory Visit: Payer: Self-pay

## 2021-01-02 ENCOUNTER — Telehealth: Payer: Medicare Other | Admitting: Family Medicine

## 2021-01-08 ENCOUNTER — Ambulatory Visit: Payer: Medicare Other | Admitting: Registered Nurse

## 2021-01-21 ENCOUNTER — Ambulatory Visit: Payer: Medicare Other | Admitting: Registered Nurse

## 2021-01-22 ENCOUNTER — Other Ambulatory Visit: Payer: Self-pay | Admitting: Family Medicine

## 2021-01-22 ENCOUNTER — Telehealth: Payer: Self-pay

## 2021-01-22 NOTE — Progress Notes (Signed)
    Chronic Care Management Pharmacy Assistant   Name: Lynn [name]  MRN: [id number] DOB: 1951/03/27   Reason for Encounter: Disease State - General Adherence Call    Recent office visits:  12/30/20 Corliss Parish, MD - Family Medicine (Video visit) - Sinusitis - Azelastine (ASTELIN) 0.1 % nasal spray, Amoxicillin-clavulanate (AUGMENTIN) 875-125 MG tablet, and Fluconazole (DIFLUCAN) 150 MG tablet prescribed. Follow up as needed.   Recent consult visits:  None noted.   Hospital visits:  None in previous 6 months  Medications: Outpatient Encounter Medications as of 01/22/2021  Medication Sig   amoxicillin-clavulanate (AUGMENTIN) 875-125 MG tablet Take 1 tablet by mouth 2 (two) times daily.   atorvastatin (LIPITOR) 20 MG tablet Take 1 tablet (20 mg total) by mouth daily.   azelastine (ASTELIN) 0.1 % nasal spray Place 1 spray into both nostrils 2 (two) times daily as needed for rhinitis. Use in each nostril as directed   fluticasone (FLONASE) 50 MCG/ACT nasal spray INSTILL 2 SPRAYS IN EACH NOSTRIL ONCE A DAY   furosemide (LASIX) 40 MG tablet TAKE 1 TABLET BY MOUTH EVERY DAY   gabapentin (NEURONTIN) 300 MG capsule TAKE 1 CAPSULE AT BEDTIME FOR 1 WEEK, THEN INCREASE TO 1 CAP TWICE A DAY FOR 1 WEEK, THEN 1 CAP 3 TIMES A DAY AS NEEDED   levothyroxine (SYNTHROID) 100 MCG tablet TAKE 1 TABLET BY MOUTH EVERY DAY   Multiple Vitamin (MULTIVITAMIN) tablet Take 1 tablet by mouth daily.   potassium chloride SA (KLOR-CON) 20 MEQ tablet Take 1 tablet (20 mEq total) by mouth daily.   Probiotic Product (PROBIOTIC-10) CHEW Chew by mouth.   promethazine (PHENERGAN) 25 MG tablet TAKE 1 TABLET BY MOUTH EVERY 6 (SIX) HOURS AS NEEDED FOR NAUSEA OR VOMITING.   No facility-administered encounter medications on file as of 01/22/2021.    Have you had any problems recently with your health?   Have you had any problems with your pharmacy?   What issues or side effects are you having with your  medications?   What would you like me to pass along to [name], CPP for them to help you with?    What can we do to take care of you better?   Star Rating Drugs: Atorvastatin (LIPITOR) 20 MG tablet - last filled 10/24/20 90 days   Future Appointments  Date Time Provider [place]  02/05/2021  2:10 PM Maximiano Coss, NP [facility] [place]  02/07/2021 11:00 AM Lyndal Pulley, DO [facility] None    Multiple attempts were made to contact patient. Attempts were unsuccessful./ls,CMA    Jobe Gibbon, [place] Pharmacist Assistant  [phone]  Time Spent: 18 minutes

## 2021-01-27 ENCOUNTER — Other Ambulatory Visit: Payer: Self-pay | Admitting: Family Medicine

## 2021-01-30 ENCOUNTER — Ambulatory Visit (INDEPENDENT_AMBULATORY_CARE_PROVIDER_SITE_OTHER): Payer: Medicare Other | Admitting: Family Medicine

## 2021-01-30 ENCOUNTER — Other Ambulatory Visit: Payer: Self-pay

## 2021-01-30 DIAGNOSIS — R030 Elevated blood-pressure reading, without diagnosis of hypertension: Secondary | ICD-10-CM

## 2021-01-30 NOTE — Patient Instructions (Signed)
Keep a record of your blood pressures outside of the office and bring them to the next office visit in next 7-10 days.   How to Take Your Blood Pressure Blood pressure is a measurement of how strongly your blood is pressing against the walls of your arteries. Arteries are blood vessels that carry blood from your heart throughout your body. Your health care provider takes your blood pressure at each office visit. You can also take your own blood pressure athome with a blood pressure monitor. You may need to take your own blood pressure to: Confirm a diagnosis of high blood pressure (hypertension). Monitor your blood pressure over time. Make sure your blood pressure medicine is working. Supplies needed: Blood pressure monitor. Dining room chair to sit in. Table or desk. Small notebook and pencil or pen. How to prepare To get the most accurate reading, avoid the following for 30 minutes before you check your blood pressure: Drinking caffeine. Drinking alcohol. Eating. Smoking. Exercising. Five minutes before you check your blood pressure: Use the bathroom and urinate so that you have an empty bladder. Sit quietly in a dining room chair. Do not sit in a soft couch or an armchair. Do not talk. How to take your blood pressure To check your blood pressure, follow the instructions in the manual that came with your blood pressure monitor. If you have a digital blood pressure monitor, the instructions may be as follows: Sit up straight in a chair. Place your feet on the floor. Do not cross your ankles or legs. Rest your left arm at the level of your heart on a table or desk or on the arm of a chair. Pull up your shirt sleeve. Wrap the blood pressure cuff around the upper part of your left arm, 1 inch (2.5 cm) above your elbow. It is best to wrap the cuff around bare skin. Fit the cuff snugly around your arm. You should be able to place only one finger between the cuff and your arm. Position the  cord so that it rests in the bend of your elbow. Press the power button. Sit quietly while the cuff inflates and deflates. Read the digital reading on the monitor screen and write the numbers down (record them) in a notebook. Wait 2-3 minutes, then repeat the steps, starting at step 1. What does my blood pressure reading mean? A blood pressure reading consists of a higher number over a lower number. Ideally, your blood pressure should be below 120/80. The first ("top") number is called the systolic pressure. It is a measure of the pressure in your arteries as your heart beats. The second ("bottom") number is called the diastolic pressure. It is a measure of the pressure in your arteries as theheart relaxes. Blood pressure is classified into five stages. The following are the stages for adults who do not have a short-term serious illness or a chronic condition. Systolic pressure and diastolic pressure are measured in a unit called mm Hg (millimeters of mercury). Normal Systolic pressure: below 123456. Diastolic pressure: below 80. Elevated Systolic pressure: [id number]. Diastolic pressure: below 80. Hypertension stage 1 Systolic pressure: 0000000. Diastolic pressure: XX123456. Hypertension stage 2 Systolic pressure: XX123456 or above. Diastolic pressure: 90 or above. You can have elevated blood pressure or hypertension even if only the systolicor only the diastolic number in your reading is higher than normal. Follow these instructions at home: Medicines Take over-the-counter and prescription medicines only as told by your health care provider. Tell your health care  provider if you are having any side effects from blood pressure medicine. General instructions Check your blood pressure as often as recommended by your health care provider. Check your blood pressure at the same time every day. Take your monitor to the next appointment with your health care provider to make sure that: You are using it  correctly. It provides accurate readings. Understand what your goal blood pressure numbers are. Keep all follow-up visits as told by your health care provider. This is important. General tips Your health care provider can suggest a reliable monitor that will meet your needs. There are several types of home blood pressure monitors. Choose a monitor that has an arm cuff. Do not choose a monitor that measures your blood pressure from your wrist or finger. Choose a cuff that wraps snugly around your upper arm. You should be able to fit only one finger between your arm and the cuff. You can buy a blood pressure monitor at most drugstores or online. Where to find more information American Heart Association: www.heart.org Contact a health care provider if: Your blood pressure is consistently high. Your blood pressure is suddenly low. Get help right away if: Your systolic blood pressure is higher than 180. Your diastolic blood pressure is higher than 120. Summary Blood pressure is a measurement of how strongly your blood is pressing against the walls of your arteries. A blood pressure reading consists of a higher number over a lower number. Ideally, your blood pressure should be below 120/80. Check your blood pressure at the same time every day. Avoid caffeine, alcohol, smoking, and exercise for 30 minutes prior to checking your blood pressure. These agents can affect the accuracy of the blood pressure reading. This information is not intended to replace advice given to you by your health care provider. Make sure you discuss any questions you have with your healthcare provider. Document Revised: 04/03/2020 Document Reviewed: 05/19/2019 Elsevier Patient Education  2022 [employer].

## 2021-01-30 NOTE — Progress Notes (Signed)
Here for nurse visit.  BP 140/100. Asymptomatic. No current antihypertensives. On lasix, potassium.  Home monitoring recommended next week then in office eval. Possible white coat HTN, vs HTN with additional med needed. Recheck in next week to 10 days as office visit.

## 2021-02-05 ENCOUNTER — Ambulatory Visit: Payer: Medicare Other | Admitting: Registered Nurse

## 2021-02-06 NOTE — Progress Notes (Signed)
[place] Phone: [phone] Subjective:   Lynn [name], am serving as a scribe for Dr. Hulan [name]. This visit occurred during the SARS-CoV-2 public health emergency.  Safety protocols were in place, including screening questions prior to the visit, additional usage of staff PPE, and extensive cleaning of exam room while observing appropriate contact time as indicated for disinfecting solutions.   I'm seeing this patient by the request  of:  Lynn Minium, MD  CC: Bilateral knee pain follow-up  [id number]  11/07/2020 Patient has done relatively well at this moment with his chronic problem but is having exacerbation.  Patient wants to avoid surgical intervention still for quite some time.  Discussed with her though that depending on how patient responds to these injections do not know how long this will last.  Patient will follow up again in 3 months to have viscosupplementation again and we will try to get approval.  Patient knows worsening symptoms to call us immediately.  Continue to work on weight, icing regimen, home exercises otherwise.  Update 02/07/2021 Lynn [name] is a 70 y.o. female coming in with complaint of B knee pain. Patient states that she has been taking potassium pills which have improved her knee pain. Patient does feel that injections last visit helped alleviate her pain.   Pain in R glute with cleaning today.      Past Medical History:  Diagnosis Date   Allergy    Arthritis    left knee   Basal cell carcinoma of skin 10/14/1994   Right breast   Basal cell carcinoma of skin 12/07/2011   Left inner elbow - TX p BX   Hyperlipidemia    Hypothyroidism    Squamous cell carcinoma of skin 12/07/2011   Left lower leg - TX p BX   Squamous cell carcinoma of skin 02/02/2017   Left anterior thigh - CX3 + 5FU   Past Surgical History:  Procedure Laterality Date   c-section 1983      COLONOSCOPY     KNEE ARTHROSCOPY     PARTIAL HYSTERECTOMY     POLYPECTOMY     TONSILLECTOMY     TUBAL LIGATION     Social History   Socioeconomic History   Marital status: Widowed    Spouse name: Not on file   Number of children: Not on file   Years of education: Not on file   Highest education level: Not on file  Occupational History   Occupation: retired  Tobacco Use   Smoking status: Never   Smokeless tobacco: Never  Vaping Use   Vaping Use: Never used  Substance and Sexual Activity   Alcohol use: [name]   Drug use: [name]   Sexual activity: Not on file  Other Topics Concern   Not on file  Social History Narrative   Not on file   Social Determinants of Health   Financial Resource Strain: Low Risk    Difficulty of Paying Living Expenses: Not hard at all  Food Insecurity: [name] Food Insecurity   Worried About Charity fundraiser in the Last Year: Never true   [place] in the Last Year: Never true  Transportation Needs: [name] Transportation Needs   Lack of Transportation (Medical): [name]   Lack of Transportation (Non-Medical): [name]  Physical Activity: Insufficiently Active   Days of Exercise per Week: 3 days   Minutes of Exercise per Session: 30 min  Stress: [name] Stress Concern Present   Feeling of Stress : Not at all  Social Connections: Moderately Integrated   Frequency of Communication with Friends and Family: More than three times a week   Frequency of Social Gatherings with Friends and Family: More than three times a week   Attends Religious Services: More than 4 times per year   Active Member of [employer] or Organizations: Not on file   Attends Archivist Meetings: 1 to 4 times per year   Marital Status: Widowed   Allergies  Allergen Reactions   Sulfa Antibiotics Nausea Only   Brimonidine Tartrate    Family History  Problem Relation Age of Onset   Colon cancer Father    Cancer Father        skin   Cancer Paternal Grandfather        skin   Colon  polyps Sister    Colon polyps Sister    Colon polyps Sister    Colon polyps Sister    Pancreatic cancer Neg Hx    Stomach cancer Neg Hx    Esophageal cancer Neg Hx    Rectal cancer Neg Hx     Current Outpatient Medications (Endocrine & Metabolic):    levothyroxine (SYNTHROID) 100 MCG tablet, TAKE 1 TABLET BY MOUTH EVERY DAY  Current Outpatient Medications (Cardiovascular):    atorvastatin (LIPITOR) 20 MG tablet, Take 1 tablet (20 mg total) by mouth daily.   furosemide (LASIX) 40 MG tablet, TAKE 1 TABLET BY MOUTH EVERY DAY  Current Outpatient Medications (Respiratory):    azelastine (ASTELIN) 0.1 % nasal spray, Place 1 spray into both nostrils 2 (two) times daily as needed for rhinitis. Use in each nostril as directed   fluticasone (FLONASE) 50 MCG/ACT nasal spray, INSTILL 2 SPRAYS IN EACH NOSTRIL ONCE A DAY   promethazine (PHENERGAN) 25 MG tablet, TAKE 1 TABLET BY MOUTH EVERY 6 (SIX) HOURS AS NEEDED FOR NAUSEA OR VOMITING.    Current Outpatient Medications (Other):    amoxicillin-clavulanate (AUGMENTIN) 875-125 MG tablet, Take 1 tablet by mouth 2 (two) times daily.   gabapentin (NEURONTIN) 300 MG capsule, TAKE 1 CAPSULE AT BEDTIME FOR 1 WEEK, THEN INCREASE TO 1 CAP TWICE A DAY FOR 1 WEEK, THEN 1 CAP 3 TIMES A DAY AS NEEDED   Multiple Vitamin (MULTIVITAMIN) tablet, Take 1 tablet by mouth daily.   potassium chloride SA (KLOR-CON) 20 MEQ tablet, Take 1 tablet (20 mEq total) by mouth daily.   Probiotic Product (PROBIOTIC-10) CHEW, Chew by mouth.    Objective  Blood pressure (!) 142/100, pulse 89, height '5\' 2"'$  (1.575 m), weight 193 lb (87.5 kg), SpO2 97 %.   General: [name] apparent distress alert and oriented x3 mood and affect normal, dressed appropriately.  HEENT: Pupils equal, extraocular movements intact  Respiratory: Patient's speak in full sentences and does not appear short of breath  Cardiovascular: [name] lower extremity edema, non tender, [name] erythema  Gait antalgic  gait Bilateral knees do have crepitus noted.  Lateral tracking of the kneecap.  Patient does have positive patellar grind.  Minimal pain noted on exam today.    Impression and Recommendations:     The above documentation has been reviewed and is accurate and complete Lynn Pulley, DO

## 2021-02-07 ENCOUNTER — Encounter: Payer: Self-pay | Admitting: Registered Nurse

## 2021-02-07 ENCOUNTER — Other Ambulatory Visit: Payer: Self-pay

## 2021-02-07 ENCOUNTER — Encounter: Payer: Self-pay | Admitting: Family Medicine

## 2021-02-07 ENCOUNTER — Ambulatory Visit (INDEPENDENT_AMBULATORY_CARE_PROVIDER_SITE_OTHER): Payer: Medicare Other | Admitting: Family Medicine

## 2021-02-07 ENCOUNTER — Ambulatory Visit (INDEPENDENT_AMBULATORY_CARE_PROVIDER_SITE_OTHER): Payer: Medicare Other | Admitting: Registered Nurse

## 2021-02-07 DIAGNOSIS — M170 Bilateral primary osteoarthritis of knee: Secondary | ICD-10-CM

## 2021-02-07 DIAGNOSIS — R030 Elevated blood-pressure reading, without diagnosis of hypertension: Secondary | ICD-10-CM | POA: Diagnosis not present

## 2021-02-07 NOTE — Assessment & Plan Note (Signed)
Patient at the moment seems to be doing relatively well.  Feels of the knees are doing a little bit better.  We will continue to monitor at this time.  Follow-up with me again in 1 to 2 months.  Worsening pain she is approved for the viscosupplementation.

## 2021-02-07 NOTE — Progress Notes (Addendum)
Lynn [name] is a 71 y.o. female presents to the office today for Blood pressure recheck secondary to elevated BP in office.  Blood pressure medication:none noted in medication list If on medication, Last dose was at least 1-2 hours prior to recheck: N/A Blood pressure was taken in the left arm after patient rested for 5-7 minutes and was in sitting position.  There were no vitals taken for this visit.  Wendi Lastra L Haydn Cush   PLAN BP improved in office today.  Discuss lifestyle interventions and follow up as planned with PCP.  No medication at this time, but should consider in future.  Kathrin Ruddy, NP

## 2021-02-07 NOTE — Patient Instructions (Signed)
Make f/u in 4-5 weeks or can move back another month if doing well

## 2021-02-14 ENCOUNTER — Telehealth: Payer: Self-pay

## 2021-02-14 ENCOUNTER — Other Ambulatory Visit: Payer: Self-pay

## 2021-02-14 MED ORDER — GABAPENTIN 300 MG PO CAPS: ORAL_CAPSULE | ORAL | 1 refills | Status: DC

## 2021-02-14 NOTE — Telephone Encounter (Signed)
Pt is calling regarding refill request for gabapentin (NEURONTIN) 300 MG capsule  on the system saying Pt needs new RX for this medication?   Pt call back [phone]

## 2021-02-14 NOTE — Progress Notes (Addendum)
    Chronic Care Management Pharmacy Assistant   Name: Lynn [name]  MRN: [id number] DOB: 30-Mar-1951   Reason for Encounter: Disease State -  General  Adherence Call    Recent office visits:  01/30/21 Merri Ray, MD - Family Medicine - Elevated blood pressure reading - No medication changes.Monitor for 1 week at home and follow up in office.   Recent consult visits: 02/07/21 Hulan Saas. MD - Sports Medicine - Osteoarthritis of both knees - No medication changes. Follow up in 1-2 months.   Hospital visits:  None in previous 6 months  Medications: Outpatient Encounter Medications as of 02/14/2021  Medication Sig   amoxicillin-clavulanate (AUGMENTIN) 875-125 MG tablet Take 1 tablet by mouth 2 (two) times daily.   atorvastatin (LIPITOR) 20 MG tablet Take 1 tablet (20 mg total) by mouth daily.   azelastine (ASTELIN) 0.1 % nasal spray Place 1 spray into both nostrils 2 (two) times daily as needed for rhinitis. Use in each nostril as directed   fluticasone (FLONASE) 50 MCG/ACT nasal spray INSTILL 2 SPRAYS IN EACH NOSTRIL ONCE A DAY   furosemide (LASIX) 40 MG tablet TAKE 1 TABLET BY MOUTH EVERY DAY   gabapentin (NEURONTIN) 300 MG capsule TAKE 1 CAPSULE AT BEDTIME FOR 1 WEEK, THEN INCREASE TO 1 CAP TWICE A DAY FOR 1 WEEK, THEN 1 CAP 3 TIMES A DAY AS NEEDED   levothyroxine (SYNTHROID) 100 MCG tablet TAKE 1 TABLET BY MOUTH EVERY DAY   Multiple Vitamin (MULTIVITAMIN) tablet Take 1 tablet by mouth daily.   potassium chloride SA (KLOR-CON) 20 MEQ tablet Take 1 tablet (20 mEq total) by mouth daily.   Probiotic Product (PROBIOTIC-10) CHEW Chew by mouth.   promethazine (PHENERGAN) 25 MG tablet TAKE 1 TABLET BY MOUTH EVERY 6 (SIX) HOURS AS NEEDED FOR NAUSEA OR VOMITING.   No facility-administered encounter medications on file as of 02/14/2021.   Have you had any problems recently with your health?   Have you had any problems with your pharmacy? Patient denied having any problems with her  health currently.  What issues or side effects are you having with your medications? Patient denied any side effects or issues with her current medications.   What would you like me to pass along to [name], CPP for them to help you with?  Patient reported she did have an issue getting her Gabapentin refilled this month but called the office and it appeared to have been sent in today. Patient was advised to check with [facility] later today to pick up rx.   What can we do to take care of you better? Patient did not have any suggestions at this time.   Care Gaps  AWV: done 12/30/20 Colonoscopy: due 02/23/24 DM Eye Exam: N/A DEXA: done 11/12/17 Mammogram: due 12/20/21   Star Rating Drugs: Atorvastatin (LIPITOR) 20 MG tablet - last filled 10/24/20 90 days ???  Future Appointments  Date Time Provider [place]  03/05/2021  9:45 AM Lavonna Monarch, MD [facility]  03/13/2021 10:45 AM Lyndal Pulley, DO [facility] None    Jobe Gibbon, [facility] Clinical Pharmacist Assistant  [phone]  Time Spent:  30 minutes

## 2021-02-14 NOTE — Telephone Encounter (Signed)
Medication sent to patient's pharmacy.

## 2021-02-19 ENCOUNTER — Ambulatory Visit: Payer: Medicare Other | Admitting: Registered Nurse

## 2021-02-24 ENCOUNTER — Ambulatory Visit: Payer: PRIVATE HEALTH INSURANCE | Admitting: Family Medicine

## 2021-03-05 ENCOUNTER — Encounter: Payer: Self-pay | Admitting: Dermatology

## 2021-03-05 ENCOUNTER — Ambulatory Visit (INDEPENDENT_AMBULATORY_CARE_PROVIDER_SITE_OTHER): Payer: Medicare Other | Admitting: Dermatology

## 2021-03-05 ENCOUNTER — Other Ambulatory Visit: Payer: Self-pay

## 2021-03-05 DIAGNOSIS — D692 Other nonthrombocytopenic purpura: Secondary | ICD-10-CM | POA: Diagnosis not present

## 2021-03-05 DIAGNOSIS — L719 Rosacea, unspecified: Secondary | ICD-10-CM

## 2021-03-05 DIAGNOSIS — L814 Other melanin hyperpigmentation: Secondary | ICD-10-CM | POA: Diagnosis not present

## 2021-03-05 DIAGNOSIS — L570 Actinic keratosis: Secondary | ICD-10-CM

## 2021-03-05 DIAGNOSIS — Z85828 Personal history of other malignant neoplasm of skin: Secondary | ICD-10-CM

## 2021-03-05 DIAGNOSIS — Z1283 Encounter for screening for malignant neoplasm of skin: Secondary | ICD-10-CM

## 2021-03-05 NOTE — Patient Instructions (Signed)
DerMend over the counter lotion for the arms daily

## 2021-03-12 NOTE — Progress Notes (Signed)
[place] Phone: [phone] Subjective:   [name], am serving as a scribe for Dr. Hulan Saas.  This visit occurred during the SARS-CoV-2 public health emergency.  Safety protocols were in place, including screening questions prior to the visit, additional usage of staff PPE, and extensive cleaning of exam room while observing appropriate contact time as indicated for disinfecting solutions.   I'm seeing this patient by the request  of:  [name], MD  CC: Bilateral knee pain follow-up  [id number]  02/07/2021 Patient at the moment seems to be doing relatively well.  Feels of the knees are doing a little bit better.  We will continue to monitor at this time.  Follow-up with me again in 1 to 2 months.  Worsening pain she is approved for the viscosupplementation.  Update 03/13/2021 Nakiesha Rumsey is a 70 y.o. female coming in with complaint of B knee pain. Monovisc approved on 11/11/2020. Patient states that her knee pain has increased in past 2 weeks. Pain is not present today.  Patient states that on a regular basis is starting to have increasing discomfort and pain.  Sometimes stopping her from activity.     Past Medical History:  Diagnosis Date   Allergy    Arthritis    left knee   Basal cell carcinoma of skin 10/14/1994   Right breast   Basal cell carcinoma of skin 12/07/2011   Left inner elbow - TX p BX   Hyperlipidemia    Hypothyroidism    Squamous cell carcinoma of skin 12/07/2011   Left lower leg - TX p BX   Squamous cell carcinoma of skin 02/02/2017   Left anterior thigh - CX3 + 5FU   Past Surgical History:  Procedure Laterality Date   c-section 1983     COLONOSCOPY     KNEE ARTHROSCOPY     PARTIAL HYSTERECTOMY     POLYPECTOMY     TONSILLECTOMY     TUBAL LIGATION     Social History   Socioeconomic History   Marital status: Widowed    Spouse name: Not on file    Number of children: Not on file   Years of education: Not on file   Highest education level: Not on file  Occupational History   Occupation: retired  Tobacco Use   Smoking status: Never   Smokeless tobacco: Never  Vaping Use   Vaping Use: Never used  Substance and Sexual Activity   Alcohol use: No   Drug use: No   Sexual activity: Not on file  Other Topics Concern   Not on file  Social History Narrative   Not on file   Social Determinants of Health   Financial Resource Strain: Low Risk    Difficulty of Paying Living Expenses: Not hard at all  Food Insecurity: No Food Insecurity   Worried About Charity fundraiser in the Last Year: Never true   [place] in the Last Year: Never true  Transportation Needs: No Transportation Needs   Lack of Transportation (Medical): No   Lack of Transportation (Non-Medical): No  Physical Activity: Insufficiently Active   Days of Exercise per Week: 3 days   Minutes of Exercise per Session: 30 min  Stress: No Stress Concern Present   Feeling of Stress : Not at all  Social Connections: Moderately Integrated   Frequency of Communication with Friends and Family: More than three times  a week   Frequency of Social Gatherings with Friends and Family: More than three times a week   Attends Religious Services: More than 4 times per year   Active Member of Clubs or Organizations: Not on file   Attends Archivist Meetings: 1 to 4 times per year   Marital Status: Widowed   Allergies  Allergen Reactions   Sulfa Antibiotics Nausea Only   Brimonidine Tartrate    Family History  Problem Relation Age of Onset   Colon cancer Father    Cancer Father        skin   Colon polyps Sister    Colon polyps Sister    Colon polyps Sister    Colon polyps Sister    Cancer Maternal Grandfather    Cancer Paternal Grandfather        skin   Pancreatic cancer Neg Hx    Stomach cancer Neg Hx    Esophageal cancer Neg Hx    Rectal cancer Neg Hx      Current Outpatient Medications (Endocrine & Metabolic):    levothyroxine (SYNTHROID) 100 MCG tablet, TAKE 1 TABLET BY MOUTH EVERY DAY  Current Outpatient Medications (Cardiovascular):    atorvastatin (LIPITOR) 20 MG tablet, Take 1 tablet (20 mg total) by mouth daily.   furosemide (LASIX) 40 MG tablet, TAKE 1 TABLET BY MOUTH EVERY DAY  Current Outpatient Medications (Respiratory):    azelastine (ASTELIN) 0.1 % nasal spray, Place 1 spray into both nostrils 2 (two) times daily as needed for rhinitis. Use in each nostril as directed   fluticasone (FLONASE) 50 MCG/ACT nasal spray, INSTILL 2 SPRAYS IN EACH NOSTRIL ONCE A DAY   promethazine (PHENERGAN) 25 MG tablet, TAKE 1 TABLET BY MOUTH EVERY 6 (SIX) HOURS AS NEEDED FOR NAUSEA OR VOMITING.    Current Outpatient Medications (Other):    gabapentin (NEURONTIN) 300 MG capsule, 1 capsule 3 times a day as needed   Multiple Vitamin (MULTIVITAMIN) tablet, Take 1 tablet by mouth daily.   potassium chloride SA (KLOR-CON) 20 MEQ tablet, Take 1 tablet (20 mEq total) by mouth daily.   Probiotic Product (PROBIOTIC-10) CHEW, Chew by mouth.   amoxicillin-clavulanate (AUGMENTIN) 875-125 MG tablet, Take 1 tablet by mouth 2 (two) times daily. (Patient not taking: Reported on 03/05/2021)   Reviewed prior external information including notes and imaging from  primary care provider As well as notes that were available from care everywhere and other healthcare systems.  Past medical history, social, surgical and family history all reviewed in electronic medical record.  No pertanent information unless stated regarding to the chief complaint.   Review of Systems:  No headache, visual changes, nausea, vomiting, diarrhea, constipation, dizziness, abdominal pain, skin rash, fevers, chills, night sweats, weight loss, swollen lymph nodes, joint swelling, chest pain, shortness of breath, mood changes. POSITIVE muscle aches, body aches  Objective  Blood pressure  (!) 138/102, pulse 89, height 5\' 2"  (1.575 m), weight 194 lb (88 kg), SpO2 97 %.   General: No apparent distress alert and oriented x3 mood and affect normal, dressed appropriately.  Overweight HEENT: Pupils equal, extraocular movements intact  Respiratory: Patient's speak in full sentences and does not appear short of breath  Cardiovascular: No lower extremity edema, non tender, no erythema  Gait antalgic Knee: bilateral  valgus deformity noted. Large thigh to calf ratio.  Tender to palpation over medial and PF joint line.  ROM lacks last 5 degrees of extension of the last 10 degrees of flexion instability  with valgus force.  painful patellar compression. Patellar glide with moderate crepitus. Patellar and quadriceps tendons unremarkable. Hamstring and quadriceps strength is normal.  After informed written and verbal consent, patient was seated on exam table. Right knee was prepped with alcohol swab and utilizing anterolateral approach, patient's right knee space was injected with 48 mg per 3 mL of Monovisc (sodium hyaluronate) in a prefilled syringe was injected easily into the knee through a 22-gauge needle..Patient tolerated the procedure well without immediate complications.  After informed written and verbal consent, patient was seated on exam table. Left knee was prepped with alcohol swab and utilizing anterolateral approach, patient's left knee space was injected with 40 mg per 3 mL of Monovisc (sodium hyaluronate) in a prefilled syringe was injected easily into the knee through a 22-gauge needle..Patient tolerated the procedure well without immediate complications.    Impression and Recommendations:     The above documentation has been reviewed and is accurate and complete Lyndal Pulley, DO

## 2021-03-13 ENCOUNTER — Other Ambulatory Visit: Payer: Self-pay

## 2021-03-13 ENCOUNTER — Ambulatory Visit (INDEPENDENT_AMBULATORY_CARE_PROVIDER_SITE_OTHER): Payer: Medicare Other | Admitting: Family Medicine

## 2021-03-13 ENCOUNTER — Encounter: Payer: Self-pay | Admitting: Family Medicine

## 2021-03-13 DIAGNOSIS — M170 Bilateral primary osteoarthritis of knee: Secondary | ICD-10-CM | POA: Diagnosis not present

## 2021-03-13 NOTE — Assessment & Plan Note (Signed)
Viscosupplementation given today, tolerated the procedure well.  Hopefully patient will make some significant improvement.  We will consider the possibility of steroid injections again if any worsening pain.  Continue medications including the gabapentin.  Follow-up with me again 6 to 8 weeks

## 2021-03-13 NOTE — Patient Instructions (Signed)
See me in 2 months 

## 2021-03-17 ENCOUNTER — Other Ambulatory Visit: Payer: Self-pay

## 2021-03-17 ENCOUNTER — Ambulatory Visit (INDEPENDENT_AMBULATORY_CARE_PROVIDER_SITE_OTHER): Payer: Medicare Other | Admitting: Family Medicine

## 2021-03-17 DIAGNOSIS — Z23 Encounter for immunization: Secondary | ICD-10-CM | POA: Diagnosis not present

## 2021-03-21 ENCOUNTER — Encounter: Payer: Self-pay | Admitting: Dermatology

## 2021-03-21 NOTE — Progress Notes (Signed)
   Follow-Up Visit   Subjective  Lynn [name] is a 70 y.o. female who presents for the following: Annual Exam (Left neck scale x months and check back for scaly lesions. Personal history of bcc and scc).  General skin check, several crusted spots Location:  Duration:  Quality:  Associated Signs/Symptoms: Modifying Factors:  Severity:  Timing: Context:   Objective  Well appearing patient in no apparent distress; mood and affect are within normal limits. Left Arm, Right Arm 1 cm ecchymoses forearms, denies abnormal bleeding elsewhere.  Left Hand - Posterior, Left Lower Back (3), Neck - Anterior Full body skin check, Left neck, hand, and back AK's 4 mm gritty pink crust).     Head Erythema without papules or pustules central face  Left Nasal Sidewall Monochrome 3 mm symmetric tan lesion left nose; no dermoscopic atypia    A full examination was performed including scalp, head, eyes, ears, nose, lips, neck, chest, axillae, abdomen, back, buttocks, bilateral upper extremities, bilateral lower extremities, hands, feet, fingers, toes, fingernails, and toenails. All findings within normal limits unless otherwise noted below.  Areas beneath undergarments not fully examined.   Assessment & Plan    Solar purpura ([place]) (2) Left Arm; Right Arm  Over the counter DerMend lotion  AK (actinic keratosis) (5) Left Hand - Posterior; Neck - Anterior; Left Lower Back (3)  Destruction of lesion - Left Hand - Posterior, Left Lower Back, Neck - Anterior Complexity: simple   Destruction method: cryotherapy   Informed consent: discussed and consent obtained   Timeout:  patient name, date of birth, surgical site, and procedure verified Lesion destroyed using liquid nitrogen: Yes   Cryotherapy cycles:  3 Outcome: patient tolerated procedure well with no complications   Post-procedure details: wound care instructions given    Rosacea Head  No treatment  initiated  Lentigo Left Nasal Sidewall  Recheck as needed change      I, Lavonna Monarch, MD, have reviewed all documentation for this visit.  The documentation on 03/21/21 for the exam, diagnosis, procedures, and orders are all accurate and complete.

## 2021-03-27 ENCOUNTER — Encounter: Payer: Self-pay | Admitting: Family Medicine

## 2021-04-10 ENCOUNTER — Telehealth: Payer: Self-pay

## 2021-04-10 NOTE — Progress Notes (Signed)
    Chronic Care Management Pharmacy Assistant   Name: Lynn [name]  MRN: [id number] DOB: September 26, 1950   Reason for Encounter: Disease State - General Adherence Call     Recent office visits:  03/17/21 Annye Asa, MD (PCP)  - Family Medicine - Flu vaccine administered.    Recent consult visits:  110/6/22 Hulan Saas, MD - Sports Medicine - Osteoarthritis - Viscosupplementation given today, tolerated the procedure well. Follow up in 6-8 weeks   03/05/21 Lavonna Monarch, MD - Dermatology - Solar Purpura - Lesion removal performed. Follow up as needed.   Hospital visits:  None in previous 6 months  Medications: Outpatient Encounter Medications as of 04/10/2021  Medication Sig   amoxicillin-clavulanate (AUGMENTIN) 875-125 MG tablet Take 1 tablet by mouth 2 (two) times daily. (Patient not taking: Reported on 03/05/2021)   atorvastatin (LIPITOR) 20 MG tablet Take 1 tablet (20 mg total) by mouth daily.   azelastine (ASTELIN) 0.1 % nasal spray Place 1 spray into both nostrils 2 (two) times daily as needed for rhinitis. Use in each nostril as directed   fluticasone (FLONASE) 50 MCG/ACT nasal spray INSTILL 2 SPRAYS IN EACH NOSTRIL ONCE A DAY   furosemide (LASIX) 40 MG tablet TAKE 1 TABLET BY MOUTH EVERY DAY   gabapentin (NEURONTIN) 300 MG capsule 1 capsule 3 times a day as needed   levothyroxine (SYNTHROID) 100 MCG tablet TAKE 1 TABLET BY MOUTH EVERY DAY   Multiple Vitamin (MULTIVITAMIN) tablet Take 1 tablet by mouth daily.   potassium chloride SA (KLOR-CON) 20 MEQ tablet Take 1 tablet (20 mEq total) by mouth daily.   Probiotic Product (PROBIOTIC-10) CHEW Chew by mouth.   promethazine (PHENERGAN) 25 MG tablet TAKE 1 TABLET BY MOUTH EVERY 6 (SIX) HOURS AS NEEDED FOR NAUSEA OR VOMITING.   No facility-administered encounter medications on file as of 04/10/2021.    Have you had any problems recently with your health?   Have you had any problems with your pharmacy?   What issues  or side effects are you having with your medications?   What would you like me to pass along to [name], CPP for them to help you with?    What can we do to take care of you better?   Care Gaps  AWV: done 12/30/20 Colonoscopy: done 02/23/19 DM Eye Exam: N/A DM Foot Exam: N/A Microalbumin: N/A HbgAIC: N/A DEXA: done 11/12/17 Mammogram: done 12/23/20  Star Rating Drugs: Atorvastatin (LIPITOR) 20 MG tablet - pt onboarded with [facility] for 04/08/21   Future Appointments  Date Time Provider [place]  05/15/2021 10:45 AM Lyndal Pulley, DO [facility] None  03/09/2022 11:30 AM Lavonna Monarch, MD [facility]   Multiple attempts were made to contact patient. Attempts were unsuccessful. / ls,CMA   Jobe Gibbon, [place] Pharmacist Assistant  [phone]  Time Spent: 22 minutes

## 2021-04-21 DIAGNOSIS — Z23 Encounter for immunization: Secondary | ICD-10-CM | POA: Diagnosis not present

## 2021-04-24 ENCOUNTER — Other Ambulatory Visit: Payer: Self-pay | Admitting: Family Medicine

## 2021-05-14 NOTE — Progress Notes (Signed)
Lynn [name] [address] [place] Phone: [phone] Subjective:   Lynn [name], am serving as a scribe for Dr. Hulan Saas. This visit occurred during the SARS-CoV-2 public health emergency.  Safety protocols were in place, including screening questions prior to the visit, additional usage of staff PPE, and extensive cleaning of exam room while observing appropriate contact time as indicated for disinfecting solutions.   I'm seeing this patient by the request  of:  [name], MD  CC: Bilateral knee pain  PPJ:[id number]  03/13/2021 Viscosupplementation given today, tolerated the procedure well.  Hopefully patient will make some significant improvement.  We will consider the possibility of steroid injections again if any worsening pain.  Continue medications including the gabapentin.  Follow-up with me again 6 to 8 weeks  Update 05/15/2021 Lynn [name] is a 70 y.o. female coming in with complaint of B knee pain. Patient states knee pain is about the same. Injection worked a couple of days later. Left knee on the lateral side is tender to touch.  Patient states he still has the difficulty with some stability but overall is significantly able to do daily activities much better.  60% better on the right knee and less than 60% better on the contralateral knee.    Past Medical History:  Diagnosis Date   Allergy    Arthritis    left knee   Basal cell carcinoma of skin 10/14/1994   Right breast   Basal cell carcinoma of skin 12/07/2011   Left inner elbow - TX p BX   Hyperlipidemia    Hypothyroidism    Squamous cell carcinoma of skin 12/07/2011   Left lower leg - TX p BX   Squamous cell carcinoma of skin 02/02/2017   Left anterior thigh - CX3 + 5FU   Past Surgical History:  Procedure Laterality Date   c-section 1983     COLONOSCOPY     KNEE ARTHROSCOPY     PARTIAL HYSTERECTOMY     POLYPECTOMY     TONSILLECTOMY      TUBAL LIGATION     Social History   Socioeconomic History   Marital status: Widowed    Spouse name: Not on file   Number of children: Not on file   Years of education: Not on file   Highest education level: Not on file  Occupational History   Occupation: retired  Tobacco Use   Smoking status: Never   Smokeless tobacco: Never  Vaping Use   Vaping Use: Never used  Substance and Sexual Activity   Alcohol use: No   Drug use: No   Sexual activity: Not on file  Other Topics Concern   Not on file  Social History Narrative   Not on file   Social Determinants of Health   Financial Resource Strain: Low Risk    Difficulty of Paying Living Expenses: Not hard at all  Food Insecurity: No Food Insecurity   Worried About Charity fundraiser in the Last Year: Never true   [place] in the Last Year: Never true  Transportation Needs: No Transportation Needs   Lack of Transportation (Medical): No   Lack of Transportation (Non-Medical): No  Physical Activity: Insufficiently Active   Days of Exercise per Week: 3 days   Minutes of Exercise per Session: 30 min  Stress: No Stress Concern Present   Feeling of Stress : Not at all  Social Connections: Moderately Integrated  Frequency of Communication with Friends and Family: More than three times a week   Frequency of Social Gatherings with Friends and Family: More than three times a week   Attends Religious Services: More than 4 times per year   Active Member of Clubs or Organizations: Not on file   Attends Archivist Meetings: 1 to 4 times per year   Marital Status: Widowed   Allergies  Allergen Reactions   Sulfa Antibiotics Nausea Only   Brimonidine Tartrate    Family History  Problem Relation Age of Onset   Colon cancer Father    Cancer Father        skin   Colon polyps Sister    Colon polyps Sister    Colon polyps Sister    Colon polyps Sister    Cancer Maternal Grandfather    Cancer Paternal Grandfather         skin   Pancreatic cancer Neg Hx    Stomach cancer Neg Hx    Esophageal cancer Neg Hx    Rectal cancer Neg Hx     Current Outpatient Medications (Endocrine & Metabolic):    levothyroxine (SYNTHROID) 100 MCG tablet, TAKE 1 TABLET BY MOUTH EVERY DAY  Current Outpatient Medications (Cardiovascular):    atorvastatin (LIPITOR) 20 MG tablet, Take 1 tablet (20 mg total) by mouth daily.   furosemide (LASIX) 40 MG tablet, TAKE 1 TABLET BY MOUTH EVERY DAY  Current Outpatient Medications (Respiratory):    azelastine (ASTELIN) 0.1 % nasal spray, Place 1 spray into both nostrils 2 (two) times daily as needed for rhinitis. Use in each nostril as directed   fluticasone (FLONASE) 50 MCG/ACT nasal spray, INSTILL 2 SPRAYS IN EACH NOSTRIL ONCE A DAY   promethazine (PHENERGAN) 25 MG tablet, TAKE 1 TABLET BY MOUTH EVERY 6 (SIX) HOURS AS NEEDED FOR NAUSEA OR VOMITING.    Current Outpatient Medications (Other):    amoxicillin-clavulanate (AUGMENTIN) 875-125 MG tablet, Take 1 tablet by mouth 2 (two) times daily. (Patient not taking: Reported on 03/05/2021)   gabapentin (NEURONTIN) 300 MG capsule, 1 capsule 3 times a day as needed   Multiple Vitamin (MULTIVITAMIN) tablet, Take 1 tablet by mouth daily.   potassium chloride SA (KLOR-CON) 20 MEQ tablet, Take 1 tablet (20 mEq total) by mouth daily.   Probiotic Product (PROBIOTIC-10) CHEW, Chew by mouth.     Review of Systems:  No headache, visual changes, nausea, vomiting, diarrhea, constipation, dizziness, abdominal pain, skin rash, fevers, chills, night sweats, weight loss, swollen lymph nodes, body aches, , chest pain, shortness of breath, mood changes. POSITIVE muscle aches, joint swelling  Objective  Blood pressure (!) 138/102, pulse 91, height 5\' 2"  (1.575 m), weight 197 lb (89.4 kg), SpO2 97 %.   General: No apparent distress alert and oriented x3 mood and affect normal, dressed appropriately.  HEENT: Pupils equal, extraocular movements intact   Respiratory: Patient's speak in full sentences and does not appear short of breath  Cardiovascular: No lower extremity edema, non tender, no erythema  Gait antalgic gait Varus deformity of the knees bilaterally.  Tender to palpation over the left knee more on the patellofemoral and lateral joint line.  Instability noted with valgus and varus force.  Trace effusion noted  After informed written and verbal consent, patient was seated on exam table. Left knee was prepped with alcohol swab and utilizing anterolateral approach, patient's left knee space was injected with 4:1  marcaine 0.5%: Kenalog 40mg /dL. Patient tolerated the procedure well without immediate  complications. Impression and Recommendations:     The above documentation has been reviewed and is accurate and complete Lyndal Pulley, DO

## 2021-05-15 ENCOUNTER — Other Ambulatory Visit: Payer: Self-pay

## 2021-05-15 ENCOUNTER — Encounter: Payer: Self-pay | Admitting: Family Medicine

## 2021-05-15 ENCOUNTER — Ambulatory Visit (INDEPENDENT_AMBULATORY_CARE_PROVIDER_SITE_OTHER): Payer: Medicare Other | Admitting: Family Medicine

## 2021-05-15 DIAGNOSIS — M1712 Unilateral primary osteoarthritis, left knee: Secondary | ICD-10-CM | POA: Diagnosis not present

## 2021-05-15 DIAGNOSIS — R030 Elevated blood-pressure reading, without diagnosis of hypertension: Secondary | ICD-10-CM | POA: Diagnosis not present

## 2021-05-15 DIAGNOSIS — M170 Bilateral primary osteoarthritis of knee: Secondary | ICD-10-CM | POA: Diagnosis not present

## 2021-05-15 NOTE — Patient Instructions (Addendum)
See primary care about blood pressure Injection today See you again in 2 months

## 2021-05-15 NOTE — Assessment & Plan Note (Signed)
No symptoms but encouraged patient to follow-up with primary care provider.

## 2021-05-15 NOTE — Assessment & Plan Note (Signed)
Patient given injection today and tolerated procedure well, discussed icing regimen and home exercise, discussed which activities to do which wants to avoid.  Chronic problem with mild exacerbation.  Patient does have end-stage osteoarthritic changes but wants to hold on any type of injection.  Was found to have elevated blood pressures encourage patient to follow-up with primary care provider.  If patient is worsening pain he does need to consider the possibility of surgical intervention.

## 2021-05-16 ENCOUNTER — Telehealth: Payer: Self-pay | Admitting: Family Medicine

## 2021-05-16 NOTE — Telephone Encounter (Signed)
Patient called (Friday, 12/9 at 4:30pm) stating that she had an injection yesterday. Since this morning, she has had a terrible headache and no medication is relieving the pain. This has happened to her before with past injections. She asked if we had any recommendations.  I advised her that there were no providers in the office at the time and that if it continued or got worse, she should go to UC to be evaluated.

## 2021-05-19 ENCOUNTER — Ambulatory Visit (INDEPENDENT_AMBULATORY_CARE_PROVIDER_SITE_OTHER): Payer: Medicare Other | Admitting: Registered Nurse

## 2021-05-19 ENCOUNTER — Other Ambulatory Visit: Payer: Self-pay

## 2021-05-19 ENCOUNTER — Encounter: Payer: Self-pay | Admitting: Registered Nurse

## 2021-05-19 VITALS — BP 100/72 | HR 80 | Temp 98.3°F | Resp 18 | Ht 62.0 in | Wt 193.8 lb

## 2021-05-19 DIAGNOSIS — R1114 Bilious vomiting: Secondary | ICD-10-CM | POA: Diagnosis not present

## 2021-05-19 DIAGNOSIS — R519 Headache, unspecified: Secondary | ICD-10-CM

## 2021-05-19 MED ORDER — DM-GUAIFENESIN ER 30-600 MG PO TB12: 1.0000 | ORAL_TABLET | Freq: Two times a day (BID) | ORAL | 0 refills | Status: DC

## 2021-05-19 MED ORDER — AZELASTINE HCL 0.1 % NA SOLN: 1.0000 | Freq: Two times a day (BID) | NASAL | 3 refills | Status: DC | PRN

## 2021-05-19 MED ORDER — ONDANSETRON 4 MG PO TBDP: 4.0000 mg | ORAL_TABLET | Freq: Three times a day (TID) | ORAL | 0 refills | Status: DC | PRN

## 2021-05-19 NOTE — Telephone Encounter (Signed)
Tried to call patient. No answer and no voice mail 

## 2021-05-19 NOTE — Patient Instructions (Addendum)
Ms. Kalise Fickett to see you, sorry you're not feeling well!  Take mucinex twice daily and tylenol 1000mg  three times daily as needed. Use azelastine and flonase daily as needed Ok to use zofran three times daily as needed for nausea and vomiting.  Let me know if you haven't "rounded a corner" by Thursday afternoon  Thank you  Rich     If you have lab work done today you will be contacted with your lab results within the next 2 weeks.  If you have not heard from [place] then please contact us. The fastest way to get your results is to register for My Chart.   IF you received an x-ray today, you will receive an invoice from [facility]. Please contact [facility] at [phone] with questions or concerns regarding your invoice.   IF you received labwork today, you will receive an invoice from [facility]. Please contact [facility] at [phone] with questions or concerns regarding your invoice.   Our billing staff will not be able to assist you with questions regarding bills from these companies.  You will be contacted with the lab results as soon as they are available. The fastest way to get your results is to activate your My Chart account. Instructions are located on the last page of this paperwork. If you have not heard from [place] regarding the results in 2 weeks, please contact this office.

## 2021-05-19 NOTE — Telephone Encounter (Signed)
Spoke to patient. She said that she has still been having trouble but is scheduled to see her PCP office today.

## 2021-05-19 NOTE — Progress Notes (Signed)
Established Patient Office Visit  Subjective:  Patient ID: Lynn [name], female    DOB: [birth year]  Age: 70 y.o. MRN: [id number]  CC:  Chief Complaint  Patient presents with   Headache    Patient states she woke up with a headache. Pt states she has tried tylenol and advil and when she try to take  medication she is vomiting.pt denied any dizziness or blurred vision.    HPI Lynn [name] presents for headache  Upon waking this morning. Taking tylenol and ibuprofen - unfortunately vomited when trying to take these - did have some nausea with the headache No dizziness or blurred vision   Denies diarrhea, fever, chills, sweats, aches, sore throat, myalgias  Notes some mild abdominal pain, but mostly "queasy".   Past Medical History:  Diagnosis Date   Allergy    Arthritis    left knee   Basal cell carcinoma of skin 10/14/1994   Right breast   Basal cell carcinoma of skin 12/07/2011   Left inner elbow - TX p BX   Hyperlipidemia    Hypothyroidism    Squamous cell carcinoma of skin 12/07/2011   Left lower leg - TX p BX   Squamous cell carcinoma of skin 02/02/2017   Left anterior thigh - CX3 + 5FU    Past Surgical History:  Procedure Laterality Date   c-section 1983     COLONOSCOPY     KNEE ARTHROSCOPY     PARTIAL HYSTERECTOMY     POLYPECTOMY     TONSILLECTOMY     TUBAL LIGATION      Family History  Problem Relation Age of Onset   Colon cancer Father    Cancer Father        skin   Colon polyps Sister    Colon polyps Sister    Colon polyps Sister    Colon polyps Sister    Cancer Maternal Grandfather    Cancer Paternal Grandfather        skin   Pancreatic cancer Neg Hx    Stomach cancer Neg Hx    Esophageal cancer Neg Hx    Rectal cancer Neg Hx     Social History   Socioeconomic History   Marital status: Widowed    Spouse name: Not on file   Number of children: Not on file   Years of education: Not on file   Highest education level: Not  on file  Occupational History   Occupation: retired  Tobacco Use   Smoking status: Never   Smokeless tobacco: Never  Vaping Use   Vaping Use: Never used  Substance and Sexual Activity   Alcohol use: No   Drug use: No   Sexual activity: Not on file  Other Topics Concern   Not on file  Social History Narrative   Not on file   Social Determinants of Health   Financial Resource Strain: Low Risk    Difficulty of Paying Living Expenses: Not hard at all  Food Insecurity: No Food Insecurity   Worried About Charity fundraiser in the Last Year: Never true   [place] in the Last Year: Never true  Transportation Needs: No Transportation Needs   Lack of Transportation (Medical): No   Lack of Transportation (Non-Medical): No  Physical Activity: Insufficiently Active   Days of Exercise per Week: 3 days   Minutes of Exercise per Session: 30 min  Stress: No Stress Concern Present   Feeling of Stress :  Not at all  Social Connections: Moderately Integrated   Frequency of Communication with Friends and Family: More than three times a week   Frequency of Social Gatherings with Friends and Family: More than three times a week   Attends Religious Services: More than 4 times per year   Active Member of [employer] or Organizations: Not on file   Attends Archivist Meetings: 1 to 4 times per year   Marital Status: Widowed  Human resources officer Violence: Not At Risk   Fear of Current or Ex-Partner: No   Emotionally Abused: No   Physically Abused: No   Sexually Abused: No    Outpatient Medications Prior to Visit  Medication Sig Dispense Refill   atorvastatin (LIPITOR) 20 MG tablet Take 1 tablet (20 mg total) by mouth daily. 90 tablet 3   fluticasone (FLONASE) 50 MCG/ACT nasal spray INSTILL 2 SPRAYS IN EACH NOSTRIL ONCE A DAY 16 mL 2   furosemide (LASIX) 40 MG tablet TAKE 1 TABLET BY MOUTH EVERY DAY 90 tablet 1   gabapentin (NEURONTIN) 300 MG capsule 1 capsule 3 times a day as needed  270 capsule 1   levothyroxine (SYNTHROID) 100 MCG tablet TAKE 1 TABLET BY MOUTH EVERY DAY 90 tablet 1   Multiple Vitamin (MULTIVITAMIN) tablet Take 1 tablet by mouth daily.     potassium chloride SA (KLOR-CON) 20 MEQ tablet Take 1 tablet (20 mEq total) by mouth daily. 30 tablet 3   Probiotic Product (PROBIOTIC-10) CHEW Chew by mouth.     promethazine (PHENERGAN) 25 MG tablet TAKE 1 TABLET BY MOUTH EVERY 6 (SIX) HOURS AS NEEDED FOR NAUSEA OR VOMITING. 30 tablet 2   azelastine (ASTELIN) 0.1 % nasal spray Place 1 spray into both nostrils 2 (two) times daily as needed for rhinitis. Use in each nostril as directed 30 mL 3   amoxicillin-clavulanate (AUGMENTIN) 875-125 MG tablet Take 1 tablet by mouth 2 (two) times daily. (Patient not taking: Reported on 03/05/2021) 20 tablet 0   No facility-administered medications prior to visit.    Allergies  Allergen Reactions   Sulfa Antibiotics Nausea Only   Brimonidine Tartrate     ROS Review of Systems  Constitutional: Negative.  Negative for activity change, appetite change, chills, diaphoresis, fatigue, fever and unexpected weight change.  HENT:  Positive for sinus pressure and sinus pain.   Eyes: Negative.   Respiratory: Negative.    Cardiovascular: Negative.   Gastrointestinal: Negative.   Endocrine: Negative.   Genitourinary: Negative.   Musculoskeletal: Negative.   Skin: Negative.   Allergic/Immunologic: Negative.   Neurological:  Positive for headaches. Negative for dizziness, tremors, seizures, syncope, facial asymmetry, speech difficulty, weakness, light-headedness and numbness.  Hematological: Negative.   Psychiatric/Behavioral: Negative.    All other systems reviewed and are negative.    Objective:    Physical Exam Vitals and nursing note reviewed.  Constitutional:      General: She is not in acute distress.    Appearance: Normal appearance. She is normal weight. She is not ill-appearing, toxic-appearing or diaphoretic.  HENT:      Head: Normocephalic and atraumatic.     Mouth/Throat:     Mouth: Mucous membranes are moist.     Pharynx: Oropharynx is clear.  Eyes:     General: No visual field deficit. Neck:     Meningeal: Brudzinski's sign and Kernig's sign absent.  Cardiovascular:     Rate and Rhythm: Normal rate and regular rhythm.     Heart sounds: Normal heart  sounds. No murmur heard.   No friction rub. No gallop.  Pulmonary:     Effort: Pulmonary effort is normal. No respiratory distress.     Breath sounds: Normal breath sounds. No stridor. No wheezing, rhonchi or rales.  Chest:     Chest wall: No tenderness.  Musculoskeletal:     Cervical back: Normal range of motion and neck supple. No rigidity.  Lymphadenopathy:     Cervical: Cervical adenopathy (mildly tender) present.  Skin:    General: Skin is warm and dry.  Neurological:     General: No focal deficit present.     Mental Status: She is alert and oriented to person, place, and time. Mental status is at baseline.     GCS: GCS eye subscore is 4. GCS verbal subscore is 5. GCS motor subscore is 6.     Cranial Nerves: No cranial nerve deficit, dysarthria or facial asymmetry.  Psychiatric:        Mood and Affect: Mood normal.        Behavior: Behavior normal.        Thought Content: Thought content normal.        Judgment: Judgment normal.    BP 100/72   Pulse 80   Temp 98.3 F (36.8 C) (Temporal)   Resp 18   Ht 5\' 2"  (1.575 m)   Wt 193 lb 12.8 oz (87.9 kg)   SpO2 99%   BMI 35.45 kg/m  Wt Readings from Last 3 Encounters:  05/19/21 193 lb 12.8 oz (87.9 kg)  05/15/21 197 lb (89.4 kg)  03/13/21 194 lb (88 kg)     There are no preventive care reminders to display for this patient.  There are no preventive care reminders to display for this patient.  Lab Results  Component Value Date   TSH 1.45 10/24/2020   Lab Results  Component Value Date   WBC 4.5 10/24/2020   HGB 14.6 10/24/2020   HCT 43.1 10/24/2020   MCV 86.4 10/24/2020    PLT 193.0 10/24/2020   Lab Results  Component Value Date   NA 141 11/08/2020   K 3.9 11/08/2020   CO2 21 11/08/2020   GLUCOSE 172 (H) 11/08/2020   BUN 21 11/08/2020   CREATININE 1.11 11/08/2020   BILITOT 1.0 10/24/2020   ALKPHOS 65 10/24/2020   AST 15 10/24/2020   ALT 11 10/24/2020   PROT 6.3 10/24/2020   ALBUMIN 4.2 10/24/2020   CALCIUM 9.2 11/08/2020   GFR 50.42 (L) 11/08/2020   Lab Results  Component Value Date   CHOL 202 (H) 10/24/2020   Lab Results  Component Value Date   HDL 48.80 10/24/2020   Lab Results  Component Value Date   LDLCALC 119 (H) 10/24/2020   Lab Results  Component Value Date   TRIG 170.0 (H) 10/24/2020   Lab Results  Component Value Date   CHOLHDL 4 10/24/2020   No results found for: HGBA1C    Assessment & Plan:   Problem List Items Addressed This Visit   None Visit Diagnoses     Sinus headache    -  Primary   Relevant Medications   dextromethorphan-guaiFENesin (MUCINEX DM) 30-600 MG 12hr tablet   azelastine (ASTELIN) 0.1 % nasal spray   Bilious vomiting with nausea       Relevant Medications   ondansetron (ZOFRAN-ODT) 4 MG disintegrating tablet       Meds ordered this encounter  Medications   ondansetron (ZOFRAN-ODT) 4 MG disintegrating tablet  Sig: Take 1 tablet (4 mg total) by mouth every 8 (eight) hours as needed for nausea or vomiting.    Dispense:  10 tablet    Refill:  0    Order Specific Question:   Supervising Provider    Answer:   Carlota Raspberry, JEFFREY R [[id number]]   dextromethorphan-guaiFENesin (MUCINEX DM) 30-600 MG 12hr tablet    Sig: Take 1 tablet by mouth 2 (two) times daily.    Dispense:  20 tablet    Refill:  0    Order Specific Question:   Supervising Provider    Answer:   Carlota Raspberry, JEFFREY R [[id number]]   azelastine (ASTELIN) 0.1 % nasal spray    Sig: Place 1 spray into both nostrils 2 (two) times daily as needed for rhinitis. Use in each nostril as directed    Dispense:  30 mL    Refill:  3    Order Specific  Question:   Supervising Provider    Answer:   Carlota Raspberry, JEFFREY R [[id number]]    Follow-up: Return if symptoms worsen or fail to improve.   PLAN Reassuring neuro exam Appears as sinus headache. Tx as viral with tylenol, mucinex, rest, hydration, azelastine Will give zofran for nausea.  If worsening or failing to improve in 5-6 days pt can start on abx. Would opt for augmentin po bid x 10 days. Patient encouraged to call clinic with any questions, comments, or concerns.  Maximiano Coss, NP

## 2021-05-22 ENCOUNTER — Telehealth: Payer: Self-pay

## 2021-05-22 MED ORDER — AMOXICILLIN 875 MG PO TABS: 875.0000 mg | ORAL_TABLET | Freq: Two times a day (BID) | ORAL | 0 refills | Status: AC

## 2021-05-22 NOTE — Telephone Encounter (Signed)
Pt is requesting Abx, saw Richard on 12/12 and was advised to call back if no improvement

## 2021-05-22 NOTE — Telephone Encounter (Signed)
Will send high dose Amoxicillin for pt

## 2021-05-22 NOTE — Telephone Encounter (Signed)
Caller name:Zoanne Suman   On DPR? :Yes  Call back number:[phone]  Provider they see: Birdie Riddle   Reason for call:Pt is calling back pt seen Richard on 12/12 for sinus pressure told her to take Mucinex over the counter and allergie noise spray and to call back if she was not improving and they can see about putting her on antibiotic if she is not improving. She is not improving and wants to see if something can be called in?   Pt uses [facility]

## 2021-06-11 ENCOUNTER — Ambulatory Visit: Payer: PRIVATE HEALTH INSURANCE | Admitting: Family Medicine

## 2021-06-12 ENCOUNTER — Other Ambulatory Visit: Payer: Self-pay | Admitting: Family Medicine

## 2021-06-12 DIAGNOSIS — Z8619 Personal history of other infectious and parasitic diseases: Secondary | ICD-10-CM

## 2021-06-27 ENCOUNTER — Other Ambulatory Visit: Payer: Self-pay

## 2021-06-27 ENCOUNTER — Other Ambulatory Visit: Payer: Self-pay | Admitting: Family Medicine

## 2021-07-16 ENCOUNTER — Ambulatory Visit: Payer: PRIVATE HEALTH INSURANCE | Admitting: Family Medicine

## 2021-07-18 ENCOUNTER — Telehealth: Payer: Self-pay | Admitting: Pharmacist

## 2021-07-18 NOTE — Progress Notes (Addendum)
° ° °  Chronic Care Management Pharmacy Assistant   Name: Lynn [name]  MRN: [id number] DOB: [birth year]   Reason for Encounter: Patient Phone Call     Patient called and stated she has "been on Furosemide forever" and now when she takes it she stays up all night. She denied being up all night due to using the bathroom but states she knows that what it is because she has did several trials with this medicine. She could not explain exactly why it keeps her up at night but states she can not sleep when she takes this medicine. She asked me to send a message to CPP and ask him to call her when he is able for her to discuss this with him further. Message forwarded to CPP at patient's request for further review.  Jobe Gibbon, [place] Pharmacist Assistant  [phone]  11 minutes spent in review, coordination, and documentation.  Unclear what is causing her to stay up at night.  She does admit to retaining fluid since not taking.  Suggested appointment with PCP ASAP to determine cause.  Reviewed by: Beverly Milch, PharmD Clinical Pharmacist [phone]

## 2021-07-22 ENCOUNTER — Ambulatory Visit: Payer: Medicare Other | Admitting: Family Medicine

## 2021-08-09 ENCOUNTER — Other Ambulatory Visit: Payer: Self-pay | Admitting: Family Medicine

## 2021-08-12 NOTE — Progress Notes (Signed)
?Lynn [name] D.O. ?[facility] ?[place] ?Phone: [phone] ?Subjective:   ?I, Lynn [name], am serving as a scribe for Dr. Hulan Saas. ? ?This visit occurred during the SARS-CoV-2 public health emergency.  Safety protocols were in place, including screening questions prior to the visit, additional usage of staff PPE, and extensive cleaning of exam room while observing appropriate contact time as indicated for disinfecting solutions.  ? ?I'm seeing this patient by the request  of:  [name], MD ? ?CC: Bilateral knee pain ? ?GUR:[id number]  ?05/15/2021 ?No symptoms but encouraged patient to follow-up with primary care provider. ? ?Patient given injection today and tolerated procedure well, discussed icing regimen and home exercise, discussed which activities to do which wants to avoid.  Chronic problem with mild exacerbation.  Patient does have end-stage osteoarthritic changes but wants to hold on any type of injection.  Was found to have elevated blood pressures encourage patient to follow-up with primary care provider.  If patient is worsening pain he does need to consider the possibility of surgical intervention. ? ?Updated 08/13/2021 ?Lynn [name] is a 71 y.o. female coming in with complaint of bilateral knee pain. States that she would like injections in both legs today. They typically last a couple of months. Pain is not getting worse from her initial visit but is constant.  ? ? ? ?  ? ?Past Medical History:  ?Diagnosis Date  ? Allergy   ? Arthritis   ? left knee  ? Basal cell carcinoma of skin 10/14/1994  ? Right breast  ? Basal cell carcinoma of skin 12/07/2011  ? Left inner elbow - TX p BX  ? Hyperlipidemia   ? Hypothyroidism   ? Squamous cell carcinoma of skin 12/07/2011  ? Left lower leg - TX p BX  ? Squamous cell carcinoma of skin 02/02/2017  ? Left anterior thigh - CX3 + 5FU  ? ?Past Surgical History:  ?Procedure Laterality Date  ? c-section  1983    ? COLONOSCOPY    ? KNEE ARTHROSCOPY    ? PARTIAL HYSTERECTOMY    ? POLYPECTOMY    ? TONSILLECTOMY    ? TUBAL LIGATION    ? ?Social History  ? ?Socioeconomic History  ? Marital status: Widowed  ?  Spouse name: Not on file  ? Number of children: Not on file  ? Years of education: Not on file  ? Highest education level: Not on file  ?Occupational History  ? Occupation: retired  ?Tobacco Use  ? Smoking status: Never  ? Smokeless tobacco: Never  ?Vaping Use  ? Vaping Use: Never used  ?Substance and Sexual Activity  ? Alcohol use: No  ? Drug use: No  ? Sexual activity: Not on file  ?Other Topics Concern  ? Not on file  ?Social History Narrative  ? Not on file  ? ?Social Determinants of Health  ? ?Financial Resource Strain: Low Risk   ? Difficulty of Paying Living Expenses: Not hard at all  ?Food Insecurity: No Food Insecurity  ? Worried About Charity fundraiser in the Last Year: Never true  ? Ran Out of Food in the Last Year: Never true  ?Transportation Needs: No Transportation Needs  ? Lack of Transportation (Medical): No  ? Lack of Transportation (Non-Medical): No  ?Physical Activity: Insufficiently Active  ? Days of Exercise per Week: 3 days  ? Minutes of Exercise per Session: 30 min  ?Stress: No Stress Concern  Present  ? Feeling of Stress : Not at all  ?Social Connections: Moderately Integrated  ? Frequency of Communication with Friends and Family: More than three times a week  ? Frequency of Social Gatherings with Friends and Family: More than three times a week  ? Attends Religious Services: More than 4 times per year  ? Active Member of Clubs or Organizations: Not on file  ? Attends Archivist Meetings: 1 to 4 times per year  ? Marital Status: Widowed  ? ?Allergies  ?Allergen Reactions  ? Sulfa Antibiotics Nausea Only  ? Brimonidine Tartrate   ? ?Family History  ?Problem Relation Age of Onset  ? Colon cancer Father   ? Cancer Father   ?     skin  ? Colon polyps Sister   ? Colon polyps Sister   ?  Colon polyps Sister   ? Colon polyps Sister   ? Cancer Maternal Grandfather   ? Cancer Paternal Grandfather   ?     skin  ? Pancreatic cancer Neg Hx   ? Stomach cancer Neg Hx   ? Esophageal cancer Neg Hx   ? Rectal cancer Neg Hx   ? ? ?Current Outpatient Medications (Endocrine & Metabolic):  ?  levothyroxine (SYNTHROID) 100 MCG tablet, TAKE 1 TABLET BY MOUTH EVERY DAY ? ?Current Outpatient Medications (Cardiovascular):  ?  atorvastatin (LIPITOR) 20 MG tablet, Take 1 tablet (20 mg total) by mouth daily. ?  furosemide (LASIX) 40 MG tablet, TAKE 1 TABLET BY MOUTH EVERY DAY ? ?Current Outpatient Medications (Respiratory):  ?  azelastine (ASTELIN) 0.1 % nasal spray, Place 1 spray into both nostrils 2 (two) times daily as needed for rhinitis. Use in each nostril as directed ?  dextromethorphan-guaiFENesin (MUCINEX DM) 30-600 MG 12hr tablet, Take 1 tablet by mouth 2 (two) times daily. ?  fluticasone (FLONASE) 50 MCG/ACT nasal spray, INSTILL 2 SPRAYS IN EACH NOSTRIL ONCE A DAY ?  promethazine (PHENERGAN) 25 MG tablet, TAKE 1 TABLET BY MOUTH EVERY 6 (SIX) HOURS AS NEEDED FOR NAUSEA OR VOMITING. ? ? ? ?Current Outpatient Medications (Other):  ?  gabapentin (NEURONTIN) 300 MG capsule, 1 capsule 3 times a day as needed ?  Multiple Vitamin (MULTIVITAMIN) tablet, Take 1 tablet by mouth daily. ?  ondansetron (ZOFRAN-ODT) 4 MG disintegrating tablet, Take 1 tablet (4 mg total) by mouth every 8 (eight) hours as needed for nausea or vomiting. ?  potassium chloride SA (KLOR-CON) 20 MEQ tablet, Take 1 tablet (20 mEq total) by mouth daily. ?  Probiotic Product (PROBIOTIC-10) CHEW, Chew by mouth. ? ? ?Reviewed prior external information including notes and imaging from  ?primary care provider ?As well as notes that were available from care everywhere and other healthcare systems. ? ?Past medical history, social, surgical and family history all reviewed in electronic medical record.  No pertanent information unless stated regarding to the  chief complaint.  ? ?Review of Systems: ? No headache, visual changes, nausea, vomiting, diarrhea, constipation, dizziness, abdominal pain, skin rash, fevers, chills, night sweats, weight loss, swollen lymph nodes, body aches, joint swelling, chest pain, shortness of breath, mood changes. POSITIVE muscle aches ? ?Objective  ?Blood pressure 116/82, pulse 86, height '5\' 2"'$  (1.575 m), weight 193 lb (87.5 kg), SpO2 98 %. ?  ?General: No apparent distress alert and oriented x3 mood and affect normal, dressed appropriately.  ?HEENT: Pupils equal, extraocular movements intact  ?Respiratory: Patient's speak in full sentences and does not appear short of breath  ?Cardiovascular:  No lower extremity edema, non tender, no erythema  ?Gait severely antalgic ?Patient's knees do have some effusion noted.  Only 95 degrees of flexion bilaterally.  No instability noted with valgus and varus force.  Severe tenderness to palpation over the medial joint line ? ?After informed written and verbal consent, patient was seated on exam table. Right knee was prepped with alcohol swab and utilizing anterolateral approach, patient's right knee space was injected with 4:1  marcaine 0.5%: Kenalog '40mg'$ /dL. Patient tolerated the procedure well without immediate complications. ? ?After informed written and verbal consent, patient was seated on exam table. Left knee was prepped with alcohol swab and utilizing anterolateral approach, patient's left knee space was injected with 4:1  marcaine 0.5%: Kenalog '40mg'$ /dL. Patient tolerated the procedure well without immediate complications. ? ?  ?Impression and Recommendations:  ?  ? ?The above documentation has been reviewed and is accurate and complete Lyndal Pulley, DO ? ? ? ?

## 2021-08-13 ENCOUNTER — Other Ambulatory Visit: Payer: Self-pay

## 2021-08-13 ENCOUNTER — Ambulatory Visit (INDEPENDENT_AMBULATORY_CARE_PROVIDER_SITE_OTHER): Payer: Medicare Other | Admitting: Family Medicine

## 2021-08-13 DIAGNOSIS — M170 Bilateral primary osteoarthritis of knee: Secondary | ICD-10-CM | POA: Diagnosis not present

## 2021-08-13 NOTE — Assessment & Plan Note (Signed)
Chronic problem with exacerbation.  Discussed icing regimen and home exercises, discussed which activities to do which wants to avoid.  Patient will continue to work on weight loss.  He does feel that the injections every 3 months seem to be helpful.  Does not want any surgical intervention.  Follow-up with me again in 3 months ?

## 2021-08-13 NOTE — Patient Instructions (Addendum)
Injected both knees today ?See you again in  ?

## 2021-08-18 ENCOUNTER — Ambulatory Visit: Payer: PRIVATE HEALTH INSURANCE | Admitting: Family Medicine

## 2021-08-22 ENCOUNTER — Encounter: Payer: Self-pay | Admitting: Family Medicine

## 2021-08-22 ENCOUNTER — Ambulatory Visit (INDEPENDENT_AMBULATORY_CARE_PROVIDER_SITE_OTHER): Payer: Medicare Other | Admitting: Family Medicine

## 2021-08-22 VITALS — BP 132/90 | HR 93 | Temp 97.5°F | Resp 16 | Wt 195.0 lb

## 2021-08-22 DIAGNOSIS — L989 Disorder of the skin and subcutaneous tissue, unspecified: Secondary | ICD-10-CM

## 2021-08-22 DIAGNOSIS — E785 Hyperlipidemia, unspecified: Secondary | ICD-10-CM

## 2021-08-22 DIAGNOSIS — T50905A Adverse effect of unspecified drugs, medicaments and biological substances, initial encounter: Secondary | ICD-10-CM | POA: Diagnosis not present

## 2021-08-22 DIAGNOSIS — E038 Other specified hypothyroidism: Secondary | ICD-10-CM

## 2021-08-22 DIAGNOSIS — R030 Elevated blood-pressure reading, without diagnosis of hypertension: Secondary | ICD-10-CM

## 2021-08-22 LAB — LIPID PANEL
Cholesterol: 233 mg/dL — ABNORMAL HIGH (ref 0–200)
HDL: 60 mg/dL (ref >39.00)
LDL Cholesterol: 145 mg/dL — ABNORMAL HIGH (ref 0–99)
NonHDL: 173.42
Total CHOL/HDL Ratio: 4
Triglycerides: 142 mg/dL (ref 0.0–149.0)
VLDL: 28.4 mg/dL (ref 0.0–40.0)

## 2021-08-22 LAB — BASIC METABOLIC PANEL
BUN: 16 mg/dL (ref 6–23)
CO2: 30 mEq/L (ref 19–32)
Calcium: 9.2 mg/dL (ref 8.4–10.5)
Chloride: 103 mEq/L (ref 96–112)
Creatinine, Ser: 1.06 mg/dL (ref 0.40–1.20)
GFR: 53 mL/min — ABNORMAL LOW (ref >60.00)
Glucose, Bld: 85 mg/dL (ref 70–99)
Potassium: 3.7 mEq/L (ref 3.5–5.1)
Sodium: 141 mEq/L (ref 135–145)

## 2021-08-22 LAB — HEPATIC FUNCTION PANEL
ALT: 11 U/L (ref 0–35)
AST: 13 U/L (ref 0–37)
Albumin: 4.1 g/dL (ref 3.5–5.2)
Alkaline Phosphatase: 71 U/L (ref 39–117)
Bilirubin, Direct: 0.1 mg/dL (ref 0.0–0.3)
Total Bilirubin: 0.7 mg/dL (ref 0.2–1.2)
Total Protein: 6.3 g/dL (ref 6.0–8.3)

## 2021-08-22 LAB — CBC WITH DIFFERENTIAL/PLATELET
Basophils Absolute: 0.1 10*3/uL (ref 0.0–0.1)
Basophils Relative: 1 % (ref 0.0–3.0)
Eosinophils Absolute: 0.1 10*3/uL (ref 0.0–0.7)
Eosinophils Relative: 1 % (ref 0.0–5.0)
HCT: 44.9 % (ref 36.0–46.0)
Hemoglobin: 15.1 g/dL — ABNORMAL HIGH (ref 12.0–15.0)
Lymphocytes Relative: 29.9 % (ref 12.0–46.0)
Lymphs Abs: 1.9 10*3/uL (ref 0.7–4.0)
MCHC: 33.6 g/dL (ref 30.0–36.0)
MCV: 85.9 fl (ref 78.0–100.0)
Monocytes Absolute: 0.6 10*3/uL (ref 0.1–1.0)
Monocytes Relative: 9.1 % (ref 3.0–12.0)
Neutro Abs: 3.7 10*3/uL (ref 1.4–7.7)
Neutrophils Relative %: 59 % (ref 43.0–77.0)
Platelets: 201 10*3/uL (ref 150.0–400.0)
RBC: 5.22 Mil/uL — ABNORMAL HIGH (ref 3.87–5.11)
RDW: 12.7 % (ref 11.5–15.5)
WBC: 6.3 10*3/uL (ref 4.0–10.5)

## 2021-08-22 LAB — TSH: TSH: 3.02 u[IU]/mL (ref 0.35–5.50)

## 2021-08-22 MED ORDER — HYDROCHLOROTHIAZIDE 12.5 MG PO TABS: 12.5000 mg | ORAL_TABLET | Freq: Every day | ORAL | 1 refills | Status: DC

## 2021-08-22 NOTE — Patient Instructions (Signed)
Follow up in 6 months to recheck cholesterol and thyroid- sooner if needed ?We'll notify you of your lab results and make any changes if needed ?START the HCTZ daily for both BP and swelling ?Call Dr Denna Haggard about the scab on the eye brow ?Call with any questions or concerns ?Stay Safe!  Stay Healthy! ?Happy Spring!!! ?

## 2021-08-22 NOTE — Assessment & Plan Note (Signed)
Chronic problem.  Complains of dry skin and hair as well as intermittent constipation.  Check labs.  Adjust meds prn  ?

## 2021-08-22 NOTE — Assessment & Plan Note (Signed)
Chronic problem, tolerating Lipitor w/o difficulty.  Check labs.  Adjust meds prn  ?

## 2021-08-22 NOTE — Progress Notes (Signed)
? ?  Subjective:  ? ? Patient ID: Lynn [name], female    DOB: 1950/08/24, 71 y.o.   MRN: [id number] ? ?HPI ?Medication problem- pt reports if she takes Furosemide she 'won't sleep for 24 hrs'  This just started in the last few months.  Has been on medication since 2018.  Pt reports she 'just can't sleep'.  Pt reports she is able to sleep w/o difficulty as long as she doesn't take medication.  Pt doesn't feel she needs the medication in cooler weather but has increased swelling as the weather warms.  Swelling occurs in both hands and feet, worse in hands.   ? ?Hyperlipidemia- chronic problem, on Lipitor '20mg'$  daily.  No CP, SOB, abd pain, N/V. ? ?Hypothyroid- chronic problem, on Levothyroxine 150mg daily.  Dry skin/hair.  Occasional issues w/ constipation. ? ?Skin lesion- L eyebrow ? ? ?Review of Systems ?For ROS see HPI  ? ?This visit occurred during the SARS-CoV-2 public health emergency.  Safety protocols were in place, including screening questions prior to the visit, additional usage of staff PPE, and extensive cleaning of exam room while observing appropriate contact time as indicated for disinfecting solutions.   ?   ?Objective:  ? Physical Exam ?Vitals reviewed.  ?Constitutional:   ?   General: She is not in acute distress. ?   Appearance: Normal appearance. She is well-developed. She is obese. She is not ill-appearing.  ?HENT:  ?   Head: Normocephalic and atraumatic.  ?Eyes:  ?   Conjunctiva/sclera: Conjunctivae normal.  ?   Pupils: Pupils are equal, round, and reactive to light.  ?Neck:  ?   Thyroid: No thyromegaly.  ?Cardiovascular:  ?   Rate and Rhythm: Normal rate and regular rhythm.  ?   Heart sounds: Normal heart sounds. No murmur heard. ?Pulmonary:  ?   Effort: Pulmonary effort is normal. No respiratory distress.  ?   Breath sounds: Normal breath sounds.  ?Abdominal:  ?   General: There is no distension.  ?   Palpations: Abdomen is soft.  ?   Tenderness: There is no abdominal tenderness.   ?Musculoskeletal:  ?   Cervical back: Normal range of motion and neck supple.  ?Lymphadenopathy:  ?   Cervical: No cervical adenopathy.  ?Skin: ?   General: Skin is warm and dry.  ?   Findings: Lesion (lesion over L eyebrow) present.  ?Neurological:  ?   Mental Status: She is alert and oriented to person, place, and time.  ?Psychiatric:     ?   Mood and Affect: Mood normal.     ?   Behavior: Behavior normal.  ? ? ? ? ? ?   ?Assessment & Plan:  ?Adverse medication effect- new.  Pt reports that after years of taking Furosemide, she can no longer take it b/c it keeps her awake for 'at least 24 hrs'.  Unclear of the relationship but pt states when she stops the medication she is able to sleep 'fine'.  Will stop Lasix and start HCTZ and monitor to make sure this doesn't have a similar effect.  Pt expressed understanding and is in agreement w/ plan.  ? ?

## 2021-08-22 NOTE — Assessment & Plan Note (Signed)
Ongoing issue for pt.  Diastolic BP is elevated today.  Since she is no longer able to tolerate the Lasix, will start HCTZ to treat both her swelling and her elevated pressure.  Pt expressed understanding and is in agreement w/ plan.  ?

## 2021-08-26 ENCOUNTER — Telehealth: Payer: Self-pay

## 2021-08-26 NOTE — Telephone Encounter (Signed)
Patient aware of labs.  

## 2021-08-26 NOTE — Telephone Encounter (Signed)
-----   Message from [name], MD sent at 08/24/2021  8:33 PM EDT ----- ?Total cholesterol and LDL have both increased since last visit.  Please continue to work on healthy diet and regular physical activity.  And please make sure you are taking your Atorvastatin daily ? ?Remainder of labs look good! ?

## 2021-09-29 ENCOUNTER — Encounter: Payer: Self-pay | Admitting: Dermatology

## 2021-09-29 ENCOUNTER — Ambulatory Visit (INDEPENDENT_AMBULATORY_CARE_PROVIDER_SITE_OTHER): Payer: Medicare Other | Admitting: Dermatology

## 2021-09-29 DIAGNOSIS — Z1283 Encounter for screening for malignant neoplasm of skin: Secondary | ICD-10-CM

## 2021-10-06 DIAGNOSIS — H43813 Vitreous degeneration, bilateral: Secondary | ICD-10-CM | POA: Diagnosis not present

## 2021-10-06 DIAGNOSIS — H2513 Age-related nuclear cataract, bilateral: Secondary | ICD-10-CM | POA: Diagnosis not present

## 2021-10-06 DIAGNOSIS — H33321 Round hole, right eye: Secondary | ICD-10-CM | POA: Diagnosis not present

## 2021-10-06 DIAGNOSIS — H1045 Other chronic allergic conjunctivitis: Secondary | ICD-10-CM | POA: Diagnosis not present

## 2021-10-15 ENCOUNTER — Other Ambulatory Visit: Payer: Self-pay | Admitting: Family Medicine

## 2021-10-15 DIAGNOSIS — Z20822 Contact with and (suspected) exposure to covid-19: Secondary | ICD-10-CM | POA: Diagnosis not present

## 2021-10-16 ENCOUNTER — Ambulatory Visit (INDEPENDENT_AMBULATORY_CARE_PROVIDER_SITE_OTHER): Payer: Medicare Other | Admitting: Dermatology

## 2021-10-16 DIAGNOSIS — L570 Actinic keratosis: Secondary | ICD-10-CM | POA: Diagnosis not present

## 2021-10-16 DIAGNOSIS — L820 Inflamed seborrheic keratosis: Secondary | ICD-10-CM

## 2021-10-16 DIAGNOSIS — D485 Neoplasm of uncertain behavior of skin: Secondary | ICD-10-CM

## 2021-10-16 MED ORDER — IMIQUIMOD 5 % EX CREA: TOPICAL_CREAM | Freq: Every day | CUTANEOUS | 0 refills | Status: DC

## 2021-10-16 NOTE — Patient Instructions (Addendum)
?  Start Aldara cream Apply to spot of left eyebrow at bedtime Monday, Wednesday, Friday x 8 weeks ? ? ? ?Biopsy, Surgery (Curettage) & Surgery (Excision) Aftercare Instructions ? ?1. Okay to remove bandage in 24 hours ? ?2. Wash area with soap and water ? ?3. Apply Vaseline to area twice daily until healed (Not Neosporin) ? ?4. Okay to cover with a Band-Aid to decrease the chance of infection or prevent irritation from clothing; also it's okay to uncover lesion at home. ? ?5. Suture instructions: return to our office in 7-10 or 10-14 days for a nurse visit for suture removal. Variable healing with sutures, if pain or itching occurs call our office. It's okay to shower or bathe 24 hours after sutures are given. ? ?6. The following risks may occur after a biopsy, curettage or excision: bleeding, scarring, discoloration, recurrence, infection (redness, yellow drainage, pain or swelling). ? ?7. For questions, concerns and results call our office at [facility] before 4pm & Friday before 3pm. Biopsy results will be available in 1 week. ? ?

## 2021-10-17 ENCOUNTER — Encounter: Payer: Self-pay | Admitting: Dermatology

## 2021-10-17 NOTE — Progress Notes (Signed)
? ?  Follow-Up Visit ?  ?Subjective  ?Lynn [name] is a 71 y.o. female who presents for the following: Skin Problem (Lesion on left eyebrow x months- wont go away & left hand middle finger- scaly spot). ? ?New spots on left eyebrow and left hand. ?Location:  ?Duration:  ?Quality:  ?Associated Signs/Symptoms: ?Modifying Factors:  ?Severity:  ?Timing: ?Context:  ? ?Objective  ?Well appearing patient in no apparent distress; mood and affect are within normal limits. ?1 cm flat waxy crust left eyebrow compatible with CIS, several smaller spot on left long finger. ? ? ? ? ? ? ? ? ?All sun exposed areas plus back examined. ? ? ?Assessment & Plan  ? ? ?Encounter for screening for malignant neoplasm of skin ? ?Will come back for bx on left eyebrow & left hand middle finger- after her mothers day beach trip. ? ? ? ? ? ?I, Lavonna Monarch, MD, have reviewed all documentation for this visit.  The documentation on 10/17/21 for the exam, diagnosis, procedures, and orders are all accurate and complete. ?

## 2021-10-25 ENCOUNTER — Other Ambulatory Visit: Payer: Self-pay

## 2021-10-25 ENCOUNTER — Emergency Department (HOSPITAL_BASED_OUTPATIENT_CLINIC_OR_DEPARTMENT_OTHER)
Admission: EM | Admit: 2021-10-25 | Discharge: 2021-10-25 | Disposition: A | Discharge status: Home / Self Care | Payer: Medicare Other | Attending: Emergency Medicine | Admitting: Emergency Medicine

## 2021-10-25 ENCOUNTER — Emergency Department (HOSPITAL_BASED_OUTPATIENT_CLINIC_OR_DEPARTMENT_OTHER): Payer: Medicare Other

## 2021-10-25 ENCOUNTER — Encounter (HOSPITAL_BASED_OUTPATIENT_CLINIC_OR_DEPARTMENT_OTHER): Payer: Self-pay | Admitting: Emergency Medicine

## 2021-10-25 DIAGNOSIS — E039 Hypothyroidism, unspecified: Secondary | ICD-10-CM | POA: Insufficient documentation

## 2021-10-25 DIAGNOSIS — M79675 Pain in left toe(s): Secondary | ICD-10-CM | POA: Diagnosis present

## 2021-10-25 DIAGNOSIS — F419 Anxiety disorder, unspecified: Secondary | ICD-10-CM | POA: Insufficient documentation

## 2021-10-25 DIAGNOSIS — M7731 Calcaneal spur, right foot: Secondary | ICD-10-CM | POA: Insufficient documentation

## 2021-10-25 DIAGNOSIS — Z79899 Other long term (current) drug therapy: Secondary | ICD-10-CM | POA: Insufficient documentation

## 2021-10-25 DIAGNOSIS — I10 Essential (primary) hypertension: Secondary | ICD-10-CM | POA: Diagnosis not present

## 2021-10-25 DIAGNOSIS — Z85828 Personal history of other malignant neoplasm of skin: Secondary | ICD-10-CM | POA: Diagnosis not present

## 2021-10-25 DIAGNOSIS — M7989 Other specified soft tissue disorders: Secondary | ICD-10-CM | POA: Diagnosis not present

## 2021-10-25 DIAGNOSIS — L03031 Cellulitis of right toe: Secondary | ICD-10-CM | POA: Diagnosis not present

## 2021-10-25 DIAGNOSIS — L03115 Cellulitis of right lower limb: Secondary | ICD-10-CM | POA: Diagnosis not present

## 2021-10-25 HISTORY — DX: Essential (primary) hypertension: I10

## 2021-10-25 MED ORDER — DOXYCYCLINE HYCLATE 100 MG PO TABS
100.0000 mg | ORAL_TABLET | Freq: Once | ORAL | Status: AC
Start: 2021-10-25 — End: 2021-10-25
  Administered 2021-10-25: 100 mg via ORAL
  Filled 2021-10-25: qty 1

## 2021-10-25 MED ORDER — HYDROCHLOROTHIAZIDE 12.5 MG PO TABS: 25.0000 mg | ORAL_TABLET | Freq: Every day | ORAL | 1 refills | Status: DC

## 2021-10-25 MED ORDER — HYDRALAZINE HCL 10 MG PO TABS
10.0000 mg | ORAL_TABLET | Freq: Once | ORAL | Status: AC
  Administered 2021-10-25: 10 mg via ORAL
  Filled 2021-10-25: qty 1

## 2021-10-25 MED ORDER — DOXYCYCLINE HYCLATE 100 MG PO CAPS: 100.0000 mg | ORAL_CAPSULE | Freq: Two times a day (BID) | ORAL | 0 refills | Status: AC

## 2021-10-25 MED ORDER — HYDRALAZINE HCL 25 MG PO TABS: 25.0000 mg | ORAL_TABLET | Freq: Once | ORAL | 0 refills | Status: DC

## 2021-10-25 MED ORDER — DOXYCYCLINE HYCLATE 100 MG PO CAPS: 100.0000 mg | ORAL_CAPSULE | Freq: Two times a day (BID) | ORAL | 0 refills | Status: DC

## 2021-10-25 NOTE — ED Triage Notes (Signed)
Pt arrives pov, slow gait c/o right little toe pain and swelling. Redness noted, pt denies injury

## 2021-10-25 NOTE — ED Provider Notes (Signed)
[place] [facility] Provider Note   CSN: [id number] Arrival date & time: 10/25/21  1732     History  Chief Complaint  Patient presents with   Toe Pain    Lynn [name] is a 71 y.o. female.  HPI     71 year old female with a history hypertension, hyperlipidemia, hypothyroidism, history of squamous and basal cell carcinoma of the skin, presents with concern for left small toe redness and pain.   Reports that she woke up this morning to find that her small toe was red and painful.  She does not recall any trauma to it, reports maybe she hit it on something while she was getting into bed, but does not remember doing anything to it.  Had some paresthesias that resolved, and developed redness.  Denies any redness or pain to any of her other fingers or toes.  Denies any fevers, nausea, vomiting, chest pain, shortness of breath, numbness or weakness of one side or the other, difficulty talking or walking.  She is on 12.5 mg of hydrochlorothiazide daily and took her dose this morning.  Reports normally her blood pressures are well controlled, but she is feeling very anxious in the emergency department.    Past Medical History:  Diagnosis Date   Allergy    Arthritis    left knee   Basal cell carcinoma of skin 10/14/1994   Right breast   Basal cell carcinoma of skin 12/07/2011   Left inner elbow - TX p BX   Hyperlipidemia    Hypertension    Hypothyroidism    Squamous cell carcinoma of skin 12/07/2011   Left lower leg - TX p BX   Squamous cell carcinoma of skin 02/02/2017   Left anterior thigh - CX3 + 5FU     Home Medications Prior to Admission medications   Medication Sig Start Date End Date Taking? Authorizing Provider  atorvastatin (LIPITOR) 20 MG tablet Take 1 tablet (20 mg total) by mouth daily. 10/24/20   [name], MD  azelastine (ASTELIN) 0.1 % nasal spray Place 1 spray into both nostrils 2 (two) times daily as needed for rhinitis.  Use in each nostril as directed 05/19/21   Maximiano Coss, NP  doxycycline (VIBRAMYCIN) 100 MG capsule Take 1 capsule (100 mg total) by mouth 2 (two) times daily for 7 days. 10/25/21 11/01/21  Gareth Morgan, MD  fluticasone (FLONASE) 50 MCG/ACT nasal spray INSTILL 2 SPRAYS IN EACH NOSTRIL ONCE A DAY 10/15/21   [name], MD  gabapentin (NEURONTIN) 300 MG capsule 1 capsule 3 times a day as needed 02/14/21   [name], MD  imiquimod [place]) 5 % cream Apply topically at bedtime. 10/16/21   Lavonna Monarch, MD  levothyroxine (SYNTHROID) 100 MCG tablet TAKE 1 TABLET BY MOUTH EVERY DAY 06/27/21   [name], MD  Multiple Vitamin (MULTIVITAMIN) tablet Take 1 tablet by mouth daily.    [provider]  ondansetron (ZOFRAN-ODT) 4 MG disintegrating tablet Take 1 tablet (4 mg total) by mouth every 8 (eight) hours as needed for nausea or vomiting. 05/19/21   Maximiano Coss, NP  Probiotic Product (PROBIOTIC-10) CHEW Chew by mouth.    [provider]  promethazine (PHENERGAN) 25 MG tablet TAKE 1 TABLET BY MOUTH EVERY 6 (SIX) HOURS AS NEEDED FOR NAUSEA OR VOMITING. 12/31/20   [name], MD      Allergies    Sulfa antibiotics and Brimonidine tartrate    Review of Systems  Review of Systems  Physical Exam Updated Vital Signs BP (!) 219/117   Pulse 82   Temp 98.1 F (36.7 C) (Oral)   Resp (!) 22   Ht '5\' 2"'$  (1.575 m)   Wt 86.2 kg   SpO2 99%   BMI 34.75 kg/m  Physical Exam Vitals and nursing note reviewed.  Constitutional:      General: She is not in acute distress.    Appearance: She is well-developed. She is not diaphoretic.  HENT:     Head: Normocephalic and atraumatic.  Eyes:     Conjunctiva/sclera: Conjunctivae normal.  Cardiovascular:     Rate and Rhythm: Normal rate and regular rhythm.     Heart sounds: Normal heart sounds. No murmur heard.   No friction rub. No gallop.  Pulmonary:     Effort: Pulmonary effort is normal. No  respiratory distress.     Breath sounds: Normal breath sounds. No wheezing or rales.  Abdominal:     General: There is no distension.     Palpations: Abdomen is soft.     Tenderness: There is no abdominal tenderness. There is no guarding.  Musculoskeletal:        General: No tenderness.     Cervical back: Normal range of motion.  Skin:    General: Skin is warm and dry.     Capillary Refill: Capillary refill takes less than 2 seconds. Normal cap refill all toes, normal pulses    Findings: Erythema (see photo right small toe) present. No rash.  Neurological:     Mental Status: She is alert and oriented to person, place, and time.       ED Results / Procedures / Treatments   Labs (all labs ordered are listed, but only abnormal results are displayed) Labs Reviewed - No data to display  EKG None  Radiology DG Foot Complete Right  Result Date: 10/25/2021 CLINICAL DATA:  Toe swelling. Right little toe pain. No known injury. EXAM: RIGHT FOOT COMPLETE - 3+ VIEW COMPARISON:  None Available. FINDINGS: The bones are subjectively under mineralized. Slight hammertoe deformity of the digits. Otherwise normal alignment in the nonweightbearing views. No fracture or dislocation. No erosion or periostitis. Mild degenerative change of the first metatarsal phalangeal joint. Mild dorsal spurring in the midfoot. There is a plantar calcaneal spur. Coarse calcifications in the region of the plantar fascia. There is no focal soft tissue abnormality. No soft tissue gas or radiopaque foreign body. IMPRESSION: 1. No little toe osseous abnormalities or explanation for pain. 2. Degenerative change of the first metatarsophalangeal joint and dorsal midfoot. No acute osseous abnormality. 3. Plantar calcaneal spur with coarse calcifications in the region of the plantar fascia, can be seen in the setting of chronic plantar fasciitis. 4. Bones subjectively under mineralization. Electronically Signed   By: Keith Rake  M.D.   On: 10/25/2021 18:20    Procedures Procedures    Medications Ordered in ED Medications  doxycycline (VIBRA-TABS) tablet 100 mg (100 mg Oral Given 10/25/21 2346)  hydrALAZINE (APRESOLINE) tablet 10 mg (10 mg Oral Given 10/25/21 2346)    ED Course/ Medical Decision Making/ A&P                           Medical Decision Making Amount and/or Complexity of Data Reviewed Radiology: ordered.  Risk Prescription drug management.    71 year old female with a history hypertension, hyperlipidemia, hypothyroidism, history of squamous and basal cell carcinoma of the  skin, presents with concern for left small toe redness and pain.  X-ray was performed which showed no osseous abnormalities of the small toe, does show degenerative changes of the first metatarsal.  Phalangeal joint, calcaneal spur.    She has strong pulses to her feet bilaterally, and normal cap refill to this toe.  EKG was performed which showed a normal sinus rhythm.  At this time, suspect the erythema of his her toe is secondary to a cellulitis, and have low suspicion for other acute embolic phenomenon in setting of normal capillary refill, no other digits involved.  Discussed reasons to return> Will treat cellulitis with doxycycline and recommend close PCP follow up.  Blood pressures elevated initially to 169C systolic, increased after discussions to 220. She remains asymptomatic, BP down to 789 systolic. Doubt dissection given no chest pain, no abdominal pain, normal bilateral upper and lower extremity pulses, equal blood pressure bilaterally.  She reports she is under a lot of stress being here and suspect that is contributing to elevated BP, but also recommend increasing her HCTZ from 12.'5mg'$  to '25mg'$  daily and following up with her PCP.  Discussed reasons to return to the ED in detail> Given dose of hydralazine just prior to discharge.         Final Clinical Impression(s) / ED Diagnoses Final diagnoses:  Cellulitis  of toe of right foot    Rx / DC Orders ED Discharge Orders          Ordered    doxycycline (VIBRAMYCIN) 100 MG capsule  2 times daily,   Status:  Discontinued        10/25/21 2159    hydrALAZINE (APRESOLINE) 25 MG tablet   Once,   Status:  Discontinued        10/25/21 2214    hydrochlorothiazide (HYDRODIURIL) 12.5 MG tablet  Daily,   Status:  Discontinued        10/25/21 2326    doxycycline (VIBRAMYCIN) 100 MG capsule  2 times daily        10/25/21 2327              Gareth Morgan, MD 10/26/21 0015

## 2021-10-25 NOTE — Discharge Instructions (Addendum)
If you develop fevers, increasing redness, numbness, weakness, chest pain, or shortness of breath please return to the ED.

## 2021-10-28 ENCOUNTER — Ambulatory Visit (INDEPENDENT_AMBULATORY_CARE_PROVIDER_SITE_OTHER): Payer: Medicare Other | Admitting: Family Medicine

## 2021-10-28 ENCOUNTER — Encounter: Payer: Self-pay | Admitting: Family Medicine

## 2021-10-28 VITALS — BP 130/86 | HR 87 | Temp 98.6°F | Resp 16 | Ht 62.0 in | Wt 194.2 lb

## 2021-10-28 DIAGNOSIS — L03039 Cellulitis of unspecified toe: Secondary | ICD-10-CM | POA: Diagnosis not present

## 2021-10-28 DIAGNOSIS — R600 Localized edema: Secondary | ICD-10-CM

## 2021-10-28 MED ORDER — FUROSEMIDE 20 MG PO TABS: 20.0000 mg | ORAL_TABLET | Freq: Every day | ORAL | 1 refills | Status: DC

## 2021-10-28 NOTE — Progress Notes (Signed)
   Subjective:    Patient ID: Lynn [name], female    DOB: Dec 12, 1950, 71 y.o.   MRN: [id number]  HPI ER f/u- pt was seen on 5/20 in ER and dx'd w/ cellulitis of R 5th toe.  Xray did not show osteomyelitis or fx.  Started on Doxycycline.  BP was quite high in ER but this has normalized.  Toe is no longer red, appears to be more bruised.  Pain is improving.  Pt doesn't recall hitting her foot or other injury.  Pt denies feeling poorly, no fevers.  Pt does note swelling in hands and feet.  Would like to restart Lasix but at a lower dose   Review of Systems For ROS see HPI     Objective:   Physical Exam Vitals reviewed.  Constitutional:      General: She is not in acute distress.    Appearance: Normal appearance. She is not ill-appearing.  HENT:     Head: Normocephalic and atraumatic.  Cardiovascular:     Pulses: Normal pulses.  Musculoskeletal:     Right lower leg: Edema (trace) present.  Skin:    General: Skin is warm and dry.     Findings: Bruising (R 5th toe) present. No erythema.  Neurological:     General: No focal deficit present.     Mental Status: She is alert and oriented to person, place, and time.  Psychiatric:        Mood and Affect: Mood normal.        Behavior: Behavior normal.          Assessment & Plan:   Cellulitis- much improved per pt report and when compared to ER picture.  Area is now more bruised than red, not warm to the touch and much less painful.  Finish Doxy as directed.  Ice as needed for pain relief.  Pt expressed understanding and is in agreement w/ plan.   Edema- pt did not like the HCTZ.  She didn't think it helped w/ swelling and 'it made me feel bad'.  Interested in restarting Lasix but at lower dose.  Will start '20mg'$  daily.  Pt expressed understanding and is in agreement w/ plan.

## 2021-10-28 NOTE — Patient Instructions (Signed)
Follow up as needed or as scheduled Finish the Doxycycline as directed- take w/ food Continue to ice your foot and wear good supportive/cushioned shoes RESTART the Furosemide daily Call with any questions or concerns Hang in there!!!

## 2021-11-03 ENCOUNTER — Encounter: Payer: Self-pay | Admitting: Dermatology

## 2021-11-03 NOTE — Progress Notes (Signed)
   Follow-Up Visit   Subjective  Lynn [name] is a 71 y.o. female who presents for the following: Follow-up (Here to follow up on left middle finger and above left eyebrow. Possible bx. ).  Persistent crusts on left middle finger above left eyebrow Location:  Duration:  Quality:  Associated Signs/Symptoms: Modifying Factors:  Severity:  Timing: Context:   Objective  Well appearing patient in no apparent distress; mood and affect are within normal limits. Left Proximal 3rd Finger 5 mm waxy pink crust, possible superficial carcinoma     Left Eyebrow Ill defined 1 cm waxy crust, probable CIS       A focused examination was performed including head, neck, arms, hands, back.. Relevant physical exam findings are noted in the Assessment and Plan.   Assessment & Plan    Neoplasm of uncertain behavior of skin (2) Left Proximal 3rd Finger  Skin / nail biopsy Type of biopsy: tangential   Informed consent: discussed and consent obtained   Timeout: patient name, date of birth, surgical site, and procedure verified   Anesthesia: the lesion was anesthetized in a standard fashion   Anesthetic:  1% lidocaine w/ epinephrine 1-100,000 local infiltration Instrument used: flexible razor blade   Hemostasis achieved with: ferric subsulfate   Outcome: patient tolerated procedure well   Post-procedure details: wound care instructions given    Destruction of lesion Complexity: extensive   Destruction method: electrodesiccation and curettage   Informed consent: discussed and consent obtained   Timeout:  patient name, date of birth, surgical site, and procedure verified Procedure prep:  Patient was prepped and draped in usual sterile fashion Prep type:  Isopropyl alcohol Anesthesia: the lesion was anesthetized in a standard fashion   Anesthetic:  1% lidocaine w/ epinephrine 1-100,000 buffered w/ 8.4% NaHCO3 Curettage performed in three different directions: Yes    Electrodesiccation performed over the curetted area: Yes   Curettage cycles:  3 Lesion length (cm):  1.6 Lesion width (cm):  1.6 Margin per side (cm):  0 Final wound size (cm):  1.6 Hemostasis achieved with:  pressure and aluminum chloride Outcome: patient tolerated procedure well with no complications   Post-procedure details: sterile dressing applied and wound care instructions given   Dressing type: bandage and petrolatum    Specimen 1 - Surgical pathology Differential Diagnosis: scc vs bcc  Check Margins: No  Left Eyebrow  imiquimod (ALDARA) 5 % cream Apply topically at bedtime.  Start Aldara cream Apply to spot of left eyebrow at bedtime Monday, Wednesday, Friday x 8 weeks or until there is brisk reaction, recheck the area 2 months later.      I, Lavonna Monarch, MD, have reviewed all documentation for this visit.  The documentation on 11/03/21 for the exam, diagnosis, procedures, and orders are all accurate and complete.

## 2021-11-12 NOTE — Progress Notes (Signed)
Lynn [name] [address] [name] Phone: [phone] Subjective:   IVilma [name], am serving as a scribe for Dr. Hulan Saas.  I'm seeing this patient by the request  of:  [name], MD  CC: Knee pain   [id number]  08/13/2021 Chronic problem with exacerbation.  Discussed icing regimen and home exercises, discussed which activities to do which wants to avoid.  Patient will continue to work on weight loss.  He does feel that the injections every 3 months seem to be helpful.  Does not want any surgical intervention.  Follow-up with me again in 3 months  Updated 11/13/2021 Lynn [name] is a 71 y.o. female coming in with complaint of bilateral knee pain. Doing about the same. Pain is on and off. Here for injections.  Patient states starting to have increasing discomfort and pain again.  Starting to affect daily activities.  Patient is going to be traveling to the beach and being more active there.  Would like to continue to be active.       Past Medical History:  Diagnosis Date   Allergy    Arthritis    left knee   Basal cell carcinoma of skin 10/14/1994   Right breast   Basal cell carcinoma of skin 12/07/2011   Left inner elbow - TX p BX   Hyperlipidemia    Hypertension    Hypothyroidism    Squamous cell carcinoma of skin 12/07/2011   Left lower leg - TX p BX   Squamous cell carcinoma of skin 02/02/2017   Left anterior thigh - CX3 + 5FU   Past Surgical History:  Procedure Laterality Date   c-section 1983     COLONOSCOPY     KNEE ARTHROSCOPY     PARTIAL HYSTERECTOMY     POLYPECTOMY     TONSILLECTOMY     TUBAL LIGATION     Social History   Socioeconomic History   Marital status: Widowed    Spouse name: Not on file   Number of children: Not on file   Years of education: Not on file   Highest education level: Not on file  Occupational History   Occupation: retired  Tobacco Use   Smoking status:  Never   Smokeless tobacco: Never  Vaping Use   Vaping Use: Never used  Substance and Sexual Activity   Alcohol use: Yes   Drug use: No   Sexual activity: Not on file  Other Topics Concern   Not on file  Social History Narrative   Not on file   Social Determinants of Health   Financial Resource Strain: Low Risk  (12/30/2020)   Overall Financial Resource Strain (CARDIA)    Difficulty of Paying Living Expenses: Not hard at all  Food Insecurity: No Food Insecurity (12/30/2020)   Hunger Vital Sign    Worried About Running Out of Food in the Last Year: Never true    [place] in the Last Year: Never true  Transportation Needs: No Transportation Needs (12/30/2020)   PRAPARE - Hydrologist (Medical): No    Lack of Transportation (Non-Medical): No  Physical Activity: Insufficiently Active (12/30/2020)   Exercise Vital Sign    Days of Exercise per Week: 3 days    Minutes of Exercise per Session: 30 min  Stress: No Stress Concern Present (12/30/2020)   [place]  of Stress : Not at all  Social Connections: Moderately Integrated (12/30/2020)   Social Connection and Isolation Panel [NHANES]    Frequency of Communication with Friends and Family: More than three times a week    Frequency of Social Gatherings with Friends and Family: More than three times a week    Attends Religious Services: More than 4 times per year    Active Member of [employer] or Organizations: Not on file    Attends Archivist Meetings: 1 to 4 times per year    Marital Status: Widowed   Allergies  Allergen Reactions   Sulfa Antibiotics Nausea Only   Brimonidine Tartrate    Brimonidine Tartrate    Family History  Problem Relation Age of Onset   Colon cancer Father    Cancer Father        skin   Colon polyps Sister    Colon polyps Sister    Colon polyps Sister    Colon polyps Sister    Cancer  Maternal Grandfather    Cancer Paternal Grandfather        skin   Pancreatic cancer Neg Hx    Stomach cancer Neg Hx    Esophageal cancer Neg Hx    Rectal cancer Neg Hx     Current Outpatient Medications (Endocrine & Metabolic):    levothyroxine (SYNTHROID) 100 MCG tablet, TAKE 1 TABLET BY MOUTH EVERY DAY  Current Outpatient Medications (Cardiovascular):    atorvastatin (LIPITOR) 20 MG tablet, Take 1 tablet (20 mg total) by mouth daily.   furosemide (LASIX) 20 MG tablet, Take 1 tablet (20 mg total) by mouth daily.  Current Outpatient Medications (Respiratory):    fluticasone (FLONASE) 50 MCG/ACT nasal spray, INSTILL 2 SPRAYS IN EACH NOSTRIL ONCE A DAY   promethazine (PHENERGAN) 25 MG tablet, TAKE 1 TABLET BY MOUTH EVERY 6 (SIX) HOURS AS NEEDED FOR NAUSEA OR VOMITING. (Patient not taking: Reported on 10/28/2021)    Current Outpatient Medications (Other):    amoxicillin-clavulanate (AUGMENTIN) 875-125 MG tablet, Take 1 tablet by mouth 2 (two) times daily.   gabapentin (NEURONTIN) 300 MG capsule, 1 capsule 3 times a day as needed   imiquimod (ALDARA) 5 % cream, Apply topically at bedtime.   Multiple Vitamin (MULTIVITAMIN) tablet, Take 1 tablet by mouth daily.   ondansetron (ZOFRAN-ODT) 4 MG disintegrating tablet, Take 1 tablet (4 mg total) by mouth every 8 (eight) hours as needed for nausea or vomiting.   Probiotic Product (PROBIOTIC-10) CHEW, Chew by mouth.   Reviewed prior external information including notes and imaging from  primary care provider As well as notes that were available from care everywhere and other healthcare systems.  Patient was in the emergency department for cellulitis of the right foot back in May of this year.  Past medical history, social, surgical and family history all reviewed in electronic medical record.  No pertanent information unless stated regarding to the chief complaint.   Review of Systems:  No headache, visual changes, nausea, vomiting,  diarrhea, constipation, dizziness, abdominal pain, skin rash, fevers, chills, night sweats, weight loss, swollen lymph nodes, body aches, joint swelling, chest pain, shortness of breath, mood changes. POSITIVE muscle aches, joint swelling  Objective  Blood pressure 132/88, pulse 90, height '5\' 2"'$  (1.575 m), weight 195 lb (88.5 kg), SpO2 98 %.   General: No apparent distress alert and oriented x3 mood and affect normal, dressed appropriately.  HEENT: Pupils equal, extraocular movements intact  Respiratory: Patient's speak in full sentences and  does not appear short of breath  Cardiovascular: No lower extremity edema, non tender, no erythema  Gait normal with good balance and coordination.  MSK:  knee exam shows instability with valgus and varus force.  Trace effusion noted.  After informed written and verbal consent, patient was seated on exam table. Right knee was prepped with alcohol swab and utilizing anterolateral approach, patient's right knee space was injected with 4:1  marcaine 0.5%: Kenalog '40mg'$ /dL. Patient tolerated the procedure well without immediate complications.  After informed written and verbal consent, patient was seated on exam table. Left knee was prepped with alcohol swab and utilizing anterolateral approach, patient's left knee space was injected with 4:1  marcaine 0.5%: Kenalog '40mg'$ /dL. Patient tolerated the procedure well without immediate complications.   Impression and Recommendations:     The above documentation has been reviewed and is accurate and complete Lyndal Pulley, DO

## 2021-11-13 ENCOUNTER — Encounter: Payer: Self-pay | Admitting: Family Medicine

## 2021-11-13 ENCOUNTER — Ambulatory Visit (INDEPENDENT_AMBULATORY_CARE_PROVIDER_SITE_OTHER): Payer: Medicare Other | Admitting: Family Medicine

## 2021-11-13 DIAGNOSIS — M170 Bilateral primary osteoarthritis of knee: Secondary | ICD-10-CM

## 2021-11-13 NOTE — Patient Instructions (Signed)
Good to see you! We'll get approval for gel Injections in knees today See you again in 2 months

## 2021-11-13 NOTE — Assessment & Plan Note (Signed)
Patient given injection and tolerated procedure well.  Chronic problem with worsening symptoms.  Patient does have severe arthritic changes with bone-on-bone but has been doing relatively well with injections at this moment. Patient will get approval for viscosupplementation which patient has responded to that in the past .  Follow-up with me again in 2 to 3 months patient still wants to avoid surgical intervention, BMI now is at a amount that is an option.

## 2021-12-29 DIAGNOSIS — H43813 Vitreous degeneration, bilateral: Secondary | ICD-10-CM | POA: Diagnosis not present

## 2021-12-29 DIAGNOSIS — H25813 Combined forms of age-related cataract, bilateral: Secondary | ICD-10-CM | POA: Diagnosis not present

## 2021-12-29 DIAGNOSIS — H52203 Unspecified astigmatism, bilateral: Secondary | ICD-10-CM | POA: Diagnosis not present

## 2021-12-29 DIAGNOSIS — H35371 Puckering of macula, right eye: Secondary | ICD-10-CM | POA: Diagnosis not present

## 2021-12-31 ENCOUNTER — Other Ambulatory Visit (HOSPITAL_BASED_OUTPATIENT_CLINIC_OR_DEPARTMENT_OTHER): Payer: Self-pay | Admitting: Family Medicine

## 2021-12-31 ENCOUNTER — Encounter (HOSPITAL_COMMUNITY): Payer: Medicare Other

## 2021-12-31 DIAGNOSIS — Z1231 Encounter for screening mammogram for malignant neoplasm of breast: Secondary | ICD-10-CM

## 2022-01-08 ENCOUNTER — Ambulatory Visit: Payer: Medicare Other

## 2022-01-14 DIAGNOSIS — H25811 Combined forms of age-related cataract, right eye: Secondary | ICD-10-CM | POA: Insufficient documentation

## 2022-01-14 DIAGNOSIS — H52221 Regular astigmatism, right eye: Secondary | ICD-10-CM | POA: Insufficient documentation

## 2022-01-15 DIAGNOSIS — H52223 Regular astigmatism, bilateral: Secondary | ICD-10-CM | POA: Diagnosis not present

## 2022-01-15 DIAGNOSIS — H25813 Combined forms of age-related cataract, bilateral: Secondary | ICD-10-CM | POA: Diagnosis not present

## 2022-01-15 DIAGNOSIS — H25811 Combined forms of age-related cataract, right eye: Secondary | ICD-10-CM | POA: Diagnosis not present

## 2022-01-15 DIAGNOSIS — M1990 Unspecified osteoarthritis, unspecified site: Secondary | ICD-10-CM | POA: Diagnosis not present

## 2022-01-15 DIAGNOSIS — H43813 Vitreous degeneration, bilateral: Secondary | ICD-10-CM | POA: Diagnosis not present

## 2022-01-15 DIAGNOSIS — H35371 Puckering of macula, right eye: Secondary | ICD-10-CM | POA: Diagnosis not present

## 2022-01-15 DIAGNOSIS — Z882 Allergy status to sulfonamides status: Secondary | ICD-10-CM | POA: Diagnosis not present

## 2022-01-15 DIAGNOSIS — Z79890 Hormone replacement therapy (postmenopausal): Secondary | ICD-10-CM | POA: Diagnosis not present

## 2022-01-15 DIAGNOSIS — E785 Hyperlipidemia, unspecified: Secondary | ICD-10-CM | POA: Diagnosis not present

## 2022-01-15 DIAGNOSIS — Z90710 Acquired absence of both cervix and uterus: Secondary | ICD-10-CM | POA: Diagnosis not present

## 2022-01-15 DIAGNOSIS — Z79899 Other long term (current) drug therapy: Secondary | ICD-10-CM | POA: Diagnosis not present

## 2022-01-15 DIAGNOSIS — E039 Hypothyroidism, unspecified: Secondary | ICD-10-CM | POA: Diagnosis not present

## 2022-01-15 DIAGNOSIS — H2513 Age-related nuclear cataract, bilateral: Secondary | ICD-10-CM | POA: Diagnosis not present

## 2022-01-15 HISTORY — PX: OTHER SURGICAL HISTORY: SHX169

## 2022-01-16 ENCOUNTER — Other Ambulatory Visit: Payer: Self-pay | Admitting: Family Medicine

## 2022-01-16 ENCOUNTER — Encounter (HOSPITAL_BASED_OUTPATIENT_CLINIC_OR_DEPARTMENT_OTHER): Payer: Medicare Other | Admitting: Radiology

## 2022-01-16 DIAGNOSIS — Z1231 Encounter for screening mammogram for malignant neoplasm of breast: Secondary | ICD-10-CM

## 2022-01-16 NOTE — Telephone Encounter (Signed)
Patient is requesting a refill of the following medications: Requested Prescriptions   Pending Prescriptions Disp Refills   promethazine (PHENERGAN) 25 MG tablet [Pharmacy Med Name: PROMETHAZINE 25 MG TABLET] 30 tablet 2    Sig: TAKE 1 TABLET BY MOUTH EVERY 6 HOURS AS NEEDED FOR NAUSEA OR VOMITING.    Date of patient request: 01/16/2022 Last office visit: 10/28/2021 Date of last refill: 12/31/2020 Last refill amount: 30 tablets 2 refills as needed Follow up time period per chart: 02/25/2022

## 2022-01-22 ENCOUNTER — Ambulatory Visit (INDEPENDENT_AMBULATORY_CARE_PROVIDER_SITE_OTHER): Payer: Medicare Other | Admitting: Family Medicine

## 2022-01-22 ENCOUNTER — Encounter: Payer: Self-pay | Admitting: Family Medicine

## 2022-01-22 VITALS — BP 132/86 | HR 86 | Temp 97.0°F | Resp 16 | Ht 62.0 in | Wt 192.0 lb

## 2022-01-22 DIAGNOSIS — R5383 Other fatigue: Secondary | ICD-10-CM

## 2022-01-22 DIAGNOSIS — R030 Elevated blood-pressure reading, without diagnosis of hypertension: Secondary | ICD-10-CM | POA: Diagnosis not present

## 2022-01-22 DIAGNOSIS — Z23 Encounter for immunization: Secondary | ICD-10-CM

## 2022-01-22 LAB — CBC WITH DIFFERENTIAL/PLATELET
Basophils Absolute: 0.1 10*3/uL (ref 0.0–0.1)
Basophils Relative: 2.4 % (ref 0.0–3.0)
Eosinophils Absolute: 0.1 10*3/uL (ref 0.0–0.7)
Eosinophils Relative: 2.6 % (ref 0.0–5.0)
HCT: 45.4 % (ref 36.0–46.0)
Hemoglobin: 15 g/dL (ref 12.0–15.0)
Lymphocytes Relative: 25.8 % (ref 12.0–46.0)
Lymphs Abs: 1.4 10*3/uL (ref 0.7–4.0)
MCHC: 33.1 g/dL (ref 30.0–36.0)
MCV: 87.6 fl (ref 78.0–100.0)
Monocytes Absolute: 0.7 10*3/uL (ref 0.1–1.0)
Monocytes Relative: 12.5 % — ABNORMAL HIGH (ref 3.0–12.0)
Neutro Abs: 3.1 10*3/uL (ref 1.4–7.7)
Neutrophils Relative %: 56.7 % (ref 43.0–77.0)
Platelets: 209 10*3/uL (ref 150.0–400.0)
RBC: 5.18 Mil/uL — ABNORMAL HIGH (ref 3.87–5.11)
RDW: 13 % (ref 11.5–15.5)
WBC: 5.5 10*3/uL (ref 4.0–10.5)

## 2022-01-22 LAB — B12 AND FOLATE PANEL
Folate: 16.3 ng/mL (ref >5.9)
Vitamin B-12: 216 pg/mL (ref 211–911)

## 2022-01-22 LAB — BASIC METABOLIC PANEL
BUN: 13 mg/dL (ref 6–23)
CO2: 34 mEq/L — ABNORMAL HIGH (ref 19–32)
Calcium: 9.1 mg/dL (ref 8.4–10.5)
Chloride: 103 mEq/L (ref 96–112)
Creatinine, Ser: 1.05 mg/dL (ref 0.40–1.20)
GFR: 53.45 mL/min — ABNORMAL LOW (ref >60.00)
Glucose, Bld: 101 mg/dL — ABNORMAL HIGH (ref 70–99)
Potassium: 4.1 mEq/L (ref 3.5–5.1)
Sodium: 143 mEq/L (ref 135–145)

## 2022-01-22 LAB — HEPATIC FUNCTION PANEL
ALT: 19 U/L (ref 0–35)
AST: 18 U/L (ref 0–37)
Albumin: 4.1 g/dL (ref 3.5–5.2)
Alkaline Phosphatase: 74 U/L (ref 39–117)
Bilirubin, Direct: 0.1 mg/dL (ref 0.0–0.3)
Total Bilirubin: 0.7 mg/dL (ref 0.2–1.2)
Total Protein: 6.5 g/dL (ref 6.0–8.3)

## 2022-01-22 LAB — TSH: TSH: 0.83 u[IU]/mL (ref 0.35–5.50)

## 2022-01-22 NOTE — Progress Notes (Signed)
   Subjective:    Patient ID: Lynn [name], female    DOB: 1950/06/21, 71 y.o.   MRN: [id number]  HPI Elevated BP- pt had cataract surgery 8/10 and BP was 175/88.  Today it is 132/86.  Pt admits she was very nervous.  No CP, SOB, HAs other than day of surgery, edema.  Afternoon fatigue- pt reports this started prior to her recent cataract surgery.  Has hx of hypothyroid.  No change in sleeping patterns.  No medication changes.  Review of Systems For ROS see HPI     Objective:   Physical Exam Vitals reviewed.  Constitutional:      General: She is not in acute distress.    Appearance: Normal appearance. She is well-developed. She is not ill-appearing.  HENT:     Head: Normocephalic and atraumatic.  Eyes:     Conjunctiva/sclera: Conjunctivae normal.     Pupils: Pupils are equal, round, and reactive to light.  Neck:     Thyroid: No thyromegaly.  Cardiovascular:     Rate and Rhythm: Normal rate and regular rhythm.     Pulses: Normal pulses.     Heart sounds: Normal heart sounds. No murmur heard. Pulmonary:     Effort: Pulmonary effort is normal. No respiratory distress.     Breath sounds: Normal breath sounds.  Abdominal:     General: There is no distension.     Palpations: Abdomen is soft.     Tenderness: There is no abdominal tenderness.  Musculoskeletal:     Cervical back: Normal range of motion and neck supple.     Right lower leg: No edema.     Left lower leg: No edema.  Lymphadenopathy:     Cervical: No cervical adenopathy.  Skin:    General: Skin is warm and dry.  Neurological:     General: No focal deficit present.     Mental Status: She is alert and oriented to person, place, and time.  Psychiatric:        Mood and Affect: Mood normal.        Behavior: Behavior normal.        Thought Content: Thought content normal.          Assessment & Plan:   Fatigue- new.  Pt reports this was an issue prior to surgery.  She does have a hx of hypothyroid so we  will check to make sure dose of Levothyroxine is correct.  Will also look at other possible metabolic causes.  Pt expressed understanding and is in agreement w/ plan.

## 2022-01-22 NOTE — Patient Instructions (Addendum)
Follow up as needed or as scheduled We'll notify you of your lab results and make any changes if needed Your blood pressure looks great!!! Make sure you are drinking plenty of fluids- particularly in this heat Call with any questions or concerns Enjoy the rest of your summer!!!

## 2022-01-22 NOTE — Assessment & Plan Note (Signed)
BP is back WNL today.  Suspect her elevated BP was due to the stress and anxiety of cataract surgery.  Her BP does creep up every now and then so this is something we will continue to monitor going forward.  Pt expressed understanding and is in agreement w/ plan.

## 2022-01-22 NOTE — Progress Notes (Signed)
Informed pt of lab results  

## 2022-01-29 ENCOUNTER — Ambulatory Visit (INDEPENDENT_AMBULATORY_CARE_PROVIDER_SITE_OTHER): Payer: Medicare Other

## 2022-01-29 DIAGNOSIS — Z0000 Encounter for general adult medical examination without abnormal findings: Secondary | ICD-10-CM | POA: Diagnosis not present

## 2022-01-29 NOTE — Patient Instructions (Signed)
[name] , Thank you for taking time to come for your Medicare Wellness Visit. I appreciate your ongoing commitment to your health goals. Please review the following plan we discussed and let me know if I can assist you in the future.   Screening recommendations/referrals: Colonoscopy: 02/23/2019  due  02/2024 Mammogram: 02/27/2022 Bone Density: 11/12/2017 Recommended yearly ophthalmology/optometry visit for glaucoma screening and checkup Recommended yearly dental visit for hygiene and checkup  Vaccinations: Influenza vaccine: completed  Pneumococcal vaccine: completed  Tdap vaccine: 07/25/2018  due 07/2028 Shingles vaccine: will consider     Advanced directives: yes  copies in chart   Conditions/risks identified: none   Next appointment: none    Preventive Care 65 Years and Older, Female Preventive care refers to lifestyle choices and visits with your health care provider that can promote health and wellness. What does preventive care include? A yearly physical exam. This is also called an annual well check. Dental exams once or twice a year. Routine eye exams. Ask your health care provider how often you should have your eyes checked. Personal lifestyle choices, including: Daily care of your teeth and gums. Regular physical activity. Eating a healthy diet. Avoiding tobacco and drug use. Limiting alcohol use. Practicing safe sex. Taking low-dose aspirin every day. Taking vitamin and mineral supplements as recommended by your health care provider. What happens during an annual well check? The services and screenings done by your health care provider during your annual well check will depend on your age, overall health, lifestyle risk factors, and family history of disease. Counseling  Your health care provider may ask you questions about your: Alcohol use. Tobacco use. Drug use. Emotional well-being. Home and relationship well-being. Sexual activity. Eating  habits. History of falls. Memory and ability to understand (cognition). Work and work Statistician. Reproductive health. Screening  You may have the following tests or measurements: Height, weight, and BMI. Blood pressure. Lipid and cholesterol levels. These may be checked every 5 years, or more frequently if you are over 49 years old. Skin check. Lung cancer screening. You may have this screening every year starting at age 37 if you have a 30-pack-year history of smoking and currently smoke or have quit within the past 15 years. Fecal occult blood test (FOBT) of the stool. You may have this test every year starting at age 31. Flexible sigmoidoscopy or colonoscopy. You may have a sigmoidoscopy every 5 years or a colonoscopy every 10 years starting at age 39. Hepatitis C blood test. Hepatitis B blood test. Sexually transmitted disease (STD) testing. Diabetes screening. This is done by checking your blood sugar (glucose) after you have not eaten for a while (fasting). You may have this done every 1-3 years. Bone density scan. This is done to screen for osteoporosis. You may have this done starting at age 41. Mammogram. This may be done every 1-2 years. Talk to your health care provider about how often you should have regular mammograms. Talk with your health care provider about your test results, treatment options, and if necessary, the need for more tests. Vaccines  Your health care provider may recommend certain vaccines, such as: Influenza vaccine. This is recommended every year. Tetanus, diphtheria, and acellular pertussis (Tdap, Td) vaccine. You may need a Td booster every 10 years. Zoster vaccine. You may need this after age 68. Pneumococcal 13-valent conjugate (PCV13) vaccine. One dose is recommended after age 26. Pneumococcal polysaccharide (PPSV23) vaccine. One dose is recommended after age 22. Talk to your health care  provider about which screenings and vaccines you need and how  often you need them. This information is not intended to replace advice given to you by your health care provider. Make sure you discuss any questions you have with your health care provider. Document Released: 06/21/2015 Document Revised: 02/12/2016 Document Reviewed: 03/26/2015 Elsevier Interactive Patient Education  2017 [employer] Prevention in the Home Falls can cause injuries. They can happen to people of all ages. There are many things you can do to make your home safe and to help prevent falls. What can I do on the outside of my home? Regularly fix the edges of walkways and driveways and fix any cracks. Remove anything that might make you trip as you walk through a door, such as a raised step or threshold. Trim any bushes or trees on the path to your home. Use bright outdoor lighting. Clear any walking paths of anything that might make someone trip, such as rocks or tools. Regularly check to see if handrails are loose or broken. Make sure that both sides of any steps have handrails. Any raised decks and porches should have guardrails on the edges. Have any leaves, snow, or ice cleared regularly. Use sand or salt on walking paths during winter. Clean up any spills in your garage right away. This includes oil or grease spills. What can I do in the bathroom? Use night lights. Install grab bars by the toilet and in the tub and shower. Do not use towel bars as grab bars. Use non-skid mats or decals in the tub or shower. If you need to sit down in the shower, use a plastic, non-slip stool. Keep the floor dry. Clean up any water that spills on the floor as soon as it happens. Remove soap buildup in the tub or shower regularly. Attach bath mats securely with double-sided non-slip rug tape. Do not have throw rugs and other things on the floor that can make you trip. What can I do in the bedroom? Use night lights. Make sure that you have a light by your bed that is easy to  reach. Do not use any sheets or blankets that are too big for your bed. They should not hang down onto the floor. Have a firm chair that has side arms. You can use this for support while you get dressed. Do not have throw rugs and other things on the floor that can make you trip. What can I do in the kitchen? Clean up any spills right away. Avoid walking on wet floors. Keep items that you use a lot in easy-to-reach places. If you need to reach something above you, use a strong step stool that has a grab bar. Keep electrical cords out of the way. Do not use floor polish or wax that makes floors slippery. If you must use wax, use non-skid floor wax. Do not have throw rugs and other things on the floor that can make you trip. What can I do with my stairs? Do not leave any items on the stairs. Make sure that there are handrails on both sides of the stairs and use them. Fix handrails that are broken or loose. Make sure that handrails are as long as the stairways. Check any carpeting to make sure that it is firmly attached to the stairs. Fix any carpet that is loose or worn. Avoid having throw rugs at the top or bottom of the stairs. If you do have throw rugs, attach them to the  floor with carpet tape. Make sure that you have a light switch at the top of the stairs and the bottom of the stairs. If you do not have them, ask someone to add them for you. What else can I do to help prevent falls? Wear shoes that: Do not have high heels. Have rubber bottoms. Are comfortable and fit you well. Are closed at the toe. Do not wear sandals. If you use a stepladder: Make sure that it is fully opened. Do not climb a closed stepladder. Make sure that both sides of the stepladder are locked into place. Ask someone to hold it for you, if possible. Clearly mark and make sure that you can see: Any grab bars or handrails. First and last steps. Where the edge of each step is. Use tools that help you move  around (mobility aids) if they are needed. These include: Canes. Walkers. Scooters. Crutches. Turn on the lights when you go into a dark area. Replace any light bulbs as soon as they burn out. Set up your furniture so you have a clear path. Avoid moving your furniture around. If any of your floors are uneven, fix them. If there are any pets around you, be aware of where they are. Review your medicines with your doctor. Some medicines can make you feel dizzy. This can increase your chance of falling. Ask your doctor what other things that you can do to help prevent falls. This information is not intended to replace advice given to you by your health care provider. Make sure you discuss any questions you have with your health care provider. Document Released: 03/21/2009 Document Revised: 10/31/2015 Document Reviewed: 06/29/2014 Elsevier Interactive Patient Education  2017 [employer].

## 2022-01-29 NOTE — Progress Notes (Signed)
Subjective:   Lynn [name] is a 71 y.o. female who presents for Medicare Annual (Subsequent) preventive examination.   I connected with Lynn [name] today by telephone and verified that I am speaking with the correct person using two identifiers. Location patient: home Location provider: work Persons participating in the virtual visit: patient, provider.   I discussed the limitations, risks, security and privacy concerns of performing an evaluation and management service by telephone and the availability of in person appointments. I also discussed with the patient that there may be a patient responsible charge related to this service. The patient expressed understanding and verbally consented to this telephonic visit.    Interactive audio and video telecommunications were attempted between this provider and patient, however failed, due to patient having technical difficulties OR patient did not have access to video capability.  We continued and completed visit with audio only.    Review of Systems     Cardiac Risk Factors include: advanced age (>57mn, >>93women)     Objective:    Today's Vitals   There is no height or weight on file to calculate BMI.     01/29/2022   10:35 AM 10/25/2021    5:54 PM 12/30/2020    3:25 PM 12/26/2019    1:37 PM 10/22/2017    9:29 PM 06/09/2017    2:22 PM  Advanced Directives  Does Patient Have a Medical Advance Directive? Yes No Yes Yes No Yes  Type of AParamedicof AReptonLiving will  Healthcare Power of ALundLiving will  HStar LakeLiving will  Copy of HB[place]in Chart? Yes - validated most recent copy scanned in chart (See row information)  No - copy requested Yes - validated most recent copy scanned in chart (See row information)  Yes  Would patient like information on creating a medical advance directive?     No - Patient declined     Current  Medications (verified) Outpatient Encounter Medications as of 01/29/2022  Medication Sig   atorvastatin (LIPITOR) 40 MG tablet Take 40 mg by mouth daily.   cyanocobalamin 1000 MCG tablet Take 1,000 mcg by mouth daily.   fluticasone (FLONASE) 50 MCG/ACT nasal spray INSTILL 2 SPRAYS IN EACH NOSTRIL ONCE A DAY   furosemide (LASIX) 20 MG tablet Take 1 tablet (20 mg total) by mouth daily.   gabapentin (NEURONTIN) 300 MG capsule 1 capsule 3 times a day as needed   imiquimod (ALDARA) 5 % cream Apply topically at bedtime.   levothyroxine (SYNTHROID) 100 MCG tablet TAKE 1 TABLET BY MOUTH EVERY DAY   Multiple Vitamin (MULTIVITAMIN) tablet Take 1 tablet by mouth daily.   Probiotic Product (PROBIOTIC-10) CHEW Chew by mouth.   promethazine (PHENERGAN) 25 MG tablet TAKE 1 TABLET BY MOUTH EVERY 6 HOURS AS NEEDED FOR NAUSEA OR VOMITING.   atorvastatin (LIPITOR) 20 MG tablet Take 1 tablet (20 mg total) by mouth daily.   No facility-administered encounter medications on file as of 01/29/2022.    Allergies (verified) Sulfa antibiotics, Brimonidine tartrate, and Brimonidine tartrate   History: Past Medical History:  Diagnosis Date   Allergy    Arthritis    left knee   Basal cell carcinoma of skin 10/14/1994   Right breast   Basal cell carcinoma of skin 12/07/2011   Left inner elbow - TX p BX   Hyperlipidemia    Hypertension    Hypothyroidism    Squamous cell carcinoma  of skin 12/07/2011   Left lower leg - TX p BX   Squamous cell carcinoma of skin 02/02/2017   Left anterior thigh - CX3 + 5FU   Past Surgical History:  Procedure Laterality Date   ABDOMINAL HYSTERECTOMY     c-section 1983     catract  Right 01/15/2022   CESAREAN SECTION  06/27/1981   COLONOSCOPY     EYE SURGERY  01/15/22   Cataract surgery right eye   KNEE ARTHROSCOPY     PARTIAL HYSTERECTOMY     POLYPECTOMY     TONSILLECTOMY     TUBAL LIGATION     Family History  Problem Relation Age of Onset   Colon cancer Father     Cancer Father        skin   Colon polyps Sister    Colon polyps Sister    Colon polyps Sister    Colon polyps Sister    Cancer Maternal Grandfather    Cancer Paternal Grandfather        skin   Pancreatic cancer Neg Hx    Stomach cancer Neg Hx    Esophageal cancer Neg Hx    Rectal cancer Neg Hx    Social History   Socioeconomic History   Marital status: Widowed    Spouse name: Not on file   Number of children: Not on file   Years of education: Not on file   Highest education level: Not on file  Occupational History   Occupation: retired  Tobacco Use   Smoking status: Never   Smokeless tobacco: Never   Tobacco comments:    Do not and never have smoked  Vaping Use   Vaping Use: Never used  Substance and Sexual Activity   Alcohol use: Yes   Drug use: No   Sexual activity: Not Currently    Birth control/protection: Post-menopausal  Other Topics Concern   Not on file  Social History Narrative   Not on file   Social Determinants of Health   Financial Resource Strain: Low Risk  (01/29/2022)   Overall Financial Resource Strain (CARDIA)    Difficulty of Paying Living Expenses: Not hard at all  Food Insecurity: Unknown (01/29/2022)   Hunger Vital Sign    Worried About Running Out of Food in the Last Year: Not on file    Ran Out of Food in the Last Year: Never true  Transportation Needs: No Transportation Needs (01/29/2022)   PRAPARE - Hydrologist (Medical): No    Lack of Transportation (Non-Medical): No  Physical Activity: Insufficiently Active (01/29/2022)   Exercise Vital Sign    Days of Exercise per Week: 3 days    Minutes of Exercise per Session: 30 min  Stress: No Stress Concern Present (01/29/2022)   [place]    Feeling of Stress : Not at all  Social Connections: Moderately Integrated (01/29/2022)   Social Connection and Isolation Panel [NHANES]    Frequency of  Communication with Friends and Family: Twice a week    Frequency of Social Gatherings with Friends and Family: Twice a week    Attends Religious Services: 1 to 4 times per year    Active Member of [employer] or Organizations: Yes    Attends Archivist Meetings: 1 to 4 times per year    Marital Status: Widowed    Tobacco Counseling Counseling given: Not Answered Tobacco comments: Do not and never have smoked  Clinical Intake:  Pre-visit preparation completed: Yes  Pain : No/denies pain     Nutritional Risks: None Diabetes: No  How often do you need to have someone help you when you read instructions, pamphlets, or other written materials from your doctor or pharmacy?: 1 - Never What is the last grade level you completed in school?: college  Diabetic?no   Interpreter Needed?: No  Information entered by :: L.Wilson,LPN   Activities of Daily Living    01/29/2022   10:36 AM  In your present state of health, do you have any difficulty performing the following activities:  Hearing? 0  Vision? 0  Difficulty concentrating or making decisions? 0  Walking or climbing stairs? 0  Dressing or bathing? 0  Doing errands, shopping? 0  Preparing Food and eating ? N  Using the Toilet? N  In the past six months, have you accidently leaked urine? N  Do you have problems with loss of bowel control? N  Managing your Medications? N  Managing your Finances? N  Housekeeping or managing your Housekeeping? N    Patient Care Team: [name], MD as PCP - General (Family Medicine) Lavonna Monarch, MD as Consulting Physician (Dermatology) Inda Castle, MD (Inactive) as Consulting Physician (Gastroenterology) [name], [facility] as Pharmacist (Pharmacist)  Indicate any recent Medical Services you may have received from other than [facility] providers in the past year (date may be approximate).     Assessment:   This is a routine wellness examination for  [place].  Hearing/Vision screen Vision Screening - Comments:: Annual eye exams wears glasses   Dietary issues and exercise activities discussed: Current Exercise Habits: Home exercise routine, Type of exercise: walking;strength training/weights, Frequency (Times/Week): 3, Intensity: Mild, Exercise limited by: None identified   Goals Addressed             This Visit's Progress    Patient Stated   On track    Loose 10 pounds       Depression Screen    01/29/2022   10:36 AM 01/22/2022   10:47 AM 05/19/2021    1:21 PM 12/30/2020    3:07 PM 10/24/2020    8:58 AM 12/26/2019    1:39 PM 05/09/2019    8:32 AM  PHQ 2/9 Scores  PHQ - 2 Score 0 1 0 0 0 0 0  PHQ- 9 Score  4 2  0  0    Fall Risk    01/29/2022   10:36 AM 01/22/2022   10:47 AM 05/19/2021    1:20 PM 12/30/2020    3:42 PM 12/30/2020    3:26 PM  Fall Risk   Falls in the past year? 0 0 0 0 0  Number falls in past yr: 0  0 0 0  Injury with Fall? 0  0 0 0  Risk for fall due to :  No Fall Risks No Fall Risks No Fall Risks   Follow up Falls evaluation completed;Education provided Falls evaluation completed Falls evaluation completed Falls evaluation completed Falls evaluation completed;Falls prevention discussed    FALL RISK PREVENTION PERTAINING TO THE HOME:  Any stairs in or around the home? Yes  If so, are there any without handrails? No  Home free of loose throw rugs in walkways, pet beds, electrical cords, etc? Yes  Adequate lighting in your home to reduce risk of falls? Yes   ASSISTIVE DEVICES UTILIZED TO PREVENT FALLS:  Life alert? No  Use of a cane, walker or w/c? No  Grab bars in the bathroom? Yes  Shower chair or bench in shower? No  Elevated toilet seat or a handicapped toilet? No    Cognitive Function:  .  Normal cognitive status assessed by telephone conversation  by this Nurse Health Advisor. No abnormalities found.      01/29/2022   10:42 AM 12/26/2019    1:42 PM  6CIT Screen  What Year?  0 points   What month?  0 points  What time? 0 points 0 points  Count back from 20 0 points 0 points  Months in reverse 0 points 0 points  Repeat phrase 0 points 0 points  Total Score  0 points    Immunizations Immunization History  Administered Date(s) Administered   Fluad Quad(high Dose 65+) 02/17/2019, 03/11/2020, 03/17/2021   Influenza, High Dose Seasonal PF 03/07/2018   Influenza, Seasonal, Injecte, Preservative Fre 03/08/2012, 03/13/2013, 03/15/2014, 03/19/2015   Influenza,inj,Quad PF,6+ Mos 04/02/2016, 02/02/2017, 01/22/2022   Moderna Covid-19 Vaccine Bivalent Booster 8yr & up 04/21/2021   Moderna Sars-Covid-2 Vaccination 07/26/2019, 08/17/2019, 05/23/2020   Pneumococcal Conjugate-13 10/24/2015   Pneumococcal Polysaccharide-23 06/09/2017   Td 07/25/2018   Tdap 10/24/2011   Zoster Recombinat (Shingrix) 02/03/2018, 06/22/2018   Zoster, Live 10/24/2014    TDAP status: Up to date  Flu Vaccine status: Up to date  Pneumococcal vaccine status: Up to date  Covid-19 vaccine status: Completed vaccines  Qualifies for Shingles Vaccine? Yes   Zostavax completed Yes   Shingrix Completed?: Yes  Screening Tests Health Maintenance  Topic Date Due   MAMMOGRAM  12/20/2021   COLONOSCOPY (Pts 45-474yrInsurance coverage will need to be confirmed)  02/23/2024   TETANUS/TDAP  07/25/2028   Pneumonia Vaccine 6518Years old  Completed   INFLUENZA VACCINE  Completed   DEXA SCAN  Completed   Hepatitis C Screening  Completed   Zoster Vaccines- Shingrix  Completed   HPV VACCINES  Aged Out   COVID-19 Vaccine  Discontinued    Health Maintenance  Health Maintenance Due  Topic Date Due   MAMMOGRAM  12/20/2021    Colorectal cancer screening: Type of screening: Colonoscopy. Completed 02/23/2019. Repeat every 5 years  Mammogram status: Completed Scheduled 02/27/2022. Repeat every year  Bone Density status: Completed 02/12/2018. Results reflect: Bone density results: OSTEOPENIA. Repeat every  5 years.  Lung Cancer Screening: (Low Dose CT Chest recommended if Age 71-80ears, 30 pack-year currently smoking OR have quit w/in 15years.) does not qualify.   Lung Cancer Screening Referral: n/a  Additional Screening:  Hepatitis C Screening: does not qualify; 10/24/2020  Vision Screening: Recommended annual ophthalmology exams for early detection of glaucoma and other disorders of the eye. Is the patient up to date with their annual eye exam?  Yes  Who is the provider or what is the name of the office in which the patient attends annual eye exams? Dr.Patrrissia  If pt is not established with a provider, would they like to be referred to a provider to establish care? No .   Dental Screening: Recommended annual dental exams for proper oral hygiene  Community Resource Referral / Chronic Care Management: CRR required this visit?  No   CCM required this visit?  No      Plan:     I have personally reviewed and noted the following in the patient's chart:   Medical and social history Use of alcohol, tobacco or illicit drugs  Current medications and supplements including opioid prescriptions. Patient is not currently taking opioid prescriptions. Functional  ability and status Nutritional status Physical activity Advanced directives List of other physicians Hospitalizations, surgeries, and ER visits in previous 12 months Vitals Screenings to include cognitive, depression, and falls Referrals and appointments  In addition, I have reviewed and discussed with patient certain preventive protocols, quality metrics, and best practice recommendations. A written personalized care plan for preventive services as well as general preventive health recommendations were provided to patient.     Daphane Shepherd, LPN   01/15/9832   Nurse Notes: none        Subjective:   Lynn [name] is a 71 y.o. female who presents for Medicare Annual (Subsequent) preventive  examination.  Review of Systems     Cardiac Risk Factors include: advanced age (>60mn, >>4women)     Objective:    Today's Vitals   There is no height or weight on file to calculate BMI.     01/29/2022   10:35 AM 10/25/2021    5:54 PM 12/30/2020    3:25 PM 12/26/2019    1:37 PM 10/22/2017    9:29 PM 06/09/2017    2:22 PM  Advanced Directives  Does Patient Have a Medical Advance Directive? Yes No Yes Yes No Yes  Type of AParamedicof ANo NameLiving will  Healthcare Power of AT[place]Living will  HR[place]Living will  Copy of HR[place]in Chart? Yes - validated most recent copy scanned in chart (See row information)  No - copy requested Yes - validated most recent copy scanned in chart (See row information)  Yes  Would patient like information on creating a medical advance directive?     No - Patient declined     Current Medications (verified) Outpatient Encounter Medications as of 01/29/2022  Medication Sig   atorvastatin (LIPITOR) 40 MG tablet Take 40 mg by mouth daily.   cyanocobalamin 1000 MCG tablet Take 1,000 mcg by mouth daily.   fluticasone (FLONASE) 50 MCG/ACT nasal spray INSTILL 2 SPRAYS IN EACH NOSTRIL ONCE A DAY   furosemide (LASIX) 20 MG tablet Take 1 tablet (20 mg total) by mouth daily.   gabapentin (NEURONTIN) 300 MG capsule 1 capsule 3 times a day as needed   imiquimod (ALDARA) 5 % cream Apply topically at bedtime.   levothyroxine (SYNTHROID) 100 MCG tablet TAKE 1 TABLET BY MOUTH EVERY DAY   Multiple Vitamin (MULTIVITAMIN) tablet Take 1 tablet by mouth daily.   Probiotic Product (PROBIOTIC-10) CHEW Chew by mouth.   promethazine (PHENERGAN) 25 MG tablet TAKE 1 TABLET BY MOUTH EVERY 6 HOURS AS NEEDED FOR NAUSEA OR VOMITING.   atorvastatin (LIPITOR) 20 MG tablet Take 1 tablet (20 mg total) by mouth daily.   No facility-administered encounter medications on file as of 01/29/2022.     Allergies (verified) Sulfa antibiotics, Brimonidine tartrate, and Brimonidine tartrate   History: Past Medical History:  Diagnosis Date   Allergy    Arthritis    left knee   Basal cell carcinoma of skin 10/14/1994   Right breast   Basal cell carcinoma of skin 12/07/2011   Left inner elbow - TX p BX   Hyperlipidemia    Hypertension    Hypothyroidism    Squamous cell carcinoma of skin 12/07/2011   Left lower leg - TX p BX   Squamous cell carcinoma of skin 02/02/2017   Left anterior thigh - CX3 + 5FU   Past Surgical History:  Procedure Laterality Date   ABDOMINAL HYSTERECTOMY  c-section 1983     catract  Right 01/15/2022   CESAREAN SECTION  06/27/1981   COLONOSCOPY     EYE SURGERY  01/15/22   Cataract surgery right eye   KNEE ARTHROSCOPY     PARTIAL HYSTERECTOMY     POLYPECTOMY     TONSILLECTOMY     TUBAL LIGATION     Family History  Problem Relation Age of Onset   Colon cancer Father    Cancer Father        skin   Colon polyps Sister    Colon polyps Sister    Colon polyps Sister    Colon polyps Sister    Cancer Maternal Grandfather    Cancer Paternal Grandfather        skin   Pancreatic cancer Neg Hx    Stomach cancer Neg Hx    Esophageal cancer Neg Hx    Rectal cancer Neg Hx    Social History   Socioeconomic History   Marital status: Widowed    Spouse name: Not on file   Number of children: Not on file   Years of education: Not on file   Highest education level: Not on file  Occupational History   Occupation: retired  Tobacco Use   Smoking status: Never   Smokeless tobacco: Never   Tobacco comments:    Do not and never have smoked  Vaping Use   Vaping Use: Never used  Substance and Sexual Activity   Alcohol use: Yes   Drug use: No   Sexual activity: Not Currently    Birth control/protection: Post-menopausal  Other Topics Concern   Not on file  Social History Narrative   Not on file   Social Determinants of Health   Financial  Resource Strain: Low Risk  (01/29/2022)   Overall Financial Resource Strain (CARDIA)    Difficulty of Paying Living Expenses: Not hard at all  Food Insecurity: Unknown (01/29/2022)   Hunger Vital Sign    Worried About Running Out of Food in the Last Year: Not on file    Ran Out of Food in the Last Year: Never true  Transportation Needs: No Transportation Needs (01/29/2022)   PRAPARE - Hydrologist (Medical): No    Lack of Transportation (Non-Medical): No  Physical Activity: Insufficiently Active (01/29/2022)   Exercise Vital Sign    Days of Exercise per Week: 3 days    Minutes of Exercise per Session: 30 min  Stress: No Stress Concern Present (01/29/2022)   [place]    Feeling of Stress : Not at all  Social Connections: Moderately Integrated (01/29/2022)   Social Connection and Isolation Panel [NHANES]    Frequency of Communication with Friends and Family: Twice a week    Frequency of Social Gatherings with Friends and Family: Twice a week    Attends Religious Services: 1 to 4 times per year    Active Member of [employer] or Organizations: Yes    Attends Archivist Meetings: 1 to 4 times per year    Marital Status: Widowed    Tobacco Counseling Counseling given: Not Answered Tobacco comments: Do not and never have smoked   Clinical Intake:  Pre-visit preparation completed: Yes  Pain : No/denies pain     Nutritional Risks: None Diabetes: No  How often do you need to have someone help you when you read instructions, pamphlets, or other written materials from your doctor or  pharmacy?: 1 - Never What is the last grade level you completed in school?: college  Diabetic?no   Interpreter Needed?: No  Information entered by :: L.Wilson,LPN   Activities of Daily Living    01/29/2022   10:36 AM  In your present state of health, do you have any difficulty performing the  following activities:  Hearing? 0  Vision? 0  Difficulty concentrating or making decisions? 0  Walking or climbing stairs? 0  Dressing or bathing? 0  Doing errands, shopping? 0  Preparing Food and eating ? N  Using the Toilet? N  In the past six months, have you accidently leaked urine? N  Do you have problems with loss of bowel control? N  Managing your Medications? N  Managing your Finances? N  Housekeeping or managing your Housekeeping? N    Patient Care Team: [name], MD as PCP - General (Family Medicine) Lavonna Monarch, MD as Consulting Physician (Dermatology) Inda Castle, MD (Inactive) as Consulting Physician (Gastroenterology) [name], [facility] as Pharmacist (Pharmacist)  Indicate any recent Medical Services you may have received from other than [facility] providers in the past year (date may be approximate).     Assessment:   This is a routine wellness examination for [place].  Hearing/Vision screen Vision Screening - Comments:: Annual eye exams wears glasses   Dietary issues and exercise activities discussed: Current Exercise Habits: Home exercise routine, Type of exercise: walking;strength training/weights, Frequency (Times/Week): 3, Intensity: Mild, Exercise limited by: None identified   Goals Addressed             This Visit's Progress    Patient Stated   On track    Loose 10 pounds       Depression Screen    01/29/2022   10:36 AM 01/22/2022   10:47 AM 05/19/2021    1:21 PM 12/30/2020    3:07 PM 10/24/2020    8:58 AM 12/26/2019    1:39 PM 05/09/2019    8:32 AM  PHQ 2/9 Scores  PHQ - 2 Score 0 1 0 0 0 0 0  PHQ- 9 Score  4 2  0  0    Fall Risk    01/29/2022   10:36 AM 01/22/2022   10:47 AM 05/19/2021    1:20 PM 12/30/2020    3:42 PM 12/30/2020    3:26 PM  Fall Risk   Falls in the past year? 0 0 0 0 0  Number falls in past yr: 0  0 0 0  Injury with Fall? 0  0 0 0  Risk for fall due to :  No Fall Risks No Fall Risks No Fall Risks    Follow up Falls evaluation completed;Education provided Falls evaluation completed Falls evaluation completed Falls evaluation completed Falls evaluation completed;Falls prevention discussed    FALL RISK PREVENTION PERTAINING TO THE HOME:  Any stairs in or around the home? No  If so, are there any without handrails? No  Home free of loose throw rugs in walkways, pet beds, electrical cords, etc? Yes  Adequate lighting in your home to reduce risk of falls? Yes   ASSISTIVE DEVICES UTILIZED TO PREVENT FALLS:  Life alert? No  Use of a cane, walker or w/c? No  Grab bars in the bathroom? Yes  Shower chair or bench in shower? No  Elevated toilet seat or a handicapped toilet? No     Cognitive Function:        01/29/2022   10:42 AM 12/26/2019  1:42 PM  6CIT Screen  What Year?  0 points  What month?  0 points  What time? 0 points 0 points  Count back from 20 0 points 0 points  Months in reverse 0 points 0 points  Repeat phrase 0 points 0 points  Total Score  0 points    Immunizations Immunization History  Administered Date(s) Administered   Fluad Quad(high Dose 65+) 02/17/2019, 03/11/2020, 03/17/2021   Influenza, High Dose Seasonal PF 03/07/2018   Influenza, Seasonal, Injecte, Preservative Fre 03/08/2012, 03/13/2013, 03/15/2014, 03/19/2015   Influenza,inj,Quad PF,6+ Mos 04/02/2016, 02/02/2017, 01/22/2022   Moderna Covid-19 Vaccine Bivalent Booster 53yr & up 04/21/2021   Moderna Sars-Covid-2 Vaccination 07/26/2019, 08/17/2019, 05/23/2020   Pneumococcal Conjugate-13 10/24/2015   Pneumococcal Polysaccharide-23 06/09/2017   Td 07/25/2018   Tdap 10/24/2011   Zoster Recombinat (Shingrix) 02/03/2018, 06/22/2018   Zoster, Live 10/24/2014    TDAP status: Up to date  Flu Vaccine status: Up to date  Pneumococcal vaccine status: Up to date  Covid-19 vaccine status: Completed vaccines   Qualifies for Shingles Vaccine? Yes   Zostavax completed Yes   Shingrix Completed?:  Yes  Screening Tests Health Maintenance  Topic Date Due   MAMMOGRAM  12/20/2021   COLONOSCOPY (Pts 45-426yrInsurance coverage will need to be confirmed)  02/23/2024   TETANUS/TDAP  07/25/2028   Pneumonia Vaccine 6563Years old  Completed   INFLUENZA VACCINE  Completed   DEXA SCAN  Completed   Hepatitis C Screening  Completed   Zoster Vaccines- Shingrix  Completed   HPV VACCINES  Aged Out   COVID-19 Vaccine  Discontinued    Health Maintenance  Health Maintenance Due  Topic Date Due   MAMMOGRAM  12/20/2021    Colorectal cancer screening: Type of screening: Colonoscopy. Completed 02/23/2019. Repeat every 5 years  Mammogram status: Completed 12/20/2020. Repeat every year Scheduled 02/27/2022  Bone Density status: Completed 11/22/2017. Results reflect: Bone density results: OSTEOPENIA. Repeat every 5 years.  Lung Cancer Screening: (Low Dose CT Chest recommended if Age 71-80ears, 30 pack-year currently smoking OR have quit w/in 15years.) does not qualify.   Lung Cancer Screening Referral: n/a  Additional Screening:  Hepatitis C Screening: does not qualify; Completed 10/24/2020  Vision Screening: Recommended annual ophthalmology exams for early detection of glaucoma and other disorders of the eye. Is the patient up to date with their annual eye exam?  Yes  Who is the provider or what is the name of the office in which the patient attends annual eye exams? Dr.Passiet  If pt is not established with a provider, would they like to be referred to a provider to establish care? No .   Dental Screening: Recommended annual dental exams for proper oral hygiene  Community Resource Referral / Chronic Care Management: CRR required this visit?  No   CCM required this visit?  No      Plan:     I have personally reviewed and noted the following in the patient's chart:   Medical and social history Use of alcohol, tobacco or illicit drugs  Current medications and supplements  including opioid prescriptions. Patient is not currently taking opioid prescriptions. Functional ability and status Nutritional status Physical activity Advanced directives List of other physicians Hospitalizations, surgeries, and ER visits in previous 12 months Vitals Screenings to include cognitive, depression, and falls Referrals and appointments  In addition, I have reviewed and discussed with patient certain preventive protocols, quality metrics, and best practice recommendations. A written personalized care plan for  preventive services as well as general preventive health recommendations were provided to patient.     Daphane Shepherd, LPN   [id number]   Nurse Notes: none

## 2022-02-05 NOTE — Progress Notes (Signed)
Lynn [name] [place] [address] [place] Phone: [phone] Subjective:   [name], am serving as a scribe for Dr. Hulan Saas.  I'm seeing this patient by the request  of:  [name], MD  CC: Bilateral knee pain  [id number]  11/13/2021 Patient given injection and tolerated procedure well.  Chronic problem with worsening symptoms.  Patient does have severe arthritic changes with bone-on-bone but has been doing relatively well with injections at this moment. Patient will get approval for viscosupplementation which patient has responded to that in the past .  Follow-up with me again in 2 to 3 months patient still wants to avoid surgical intervention, BMI now is at a amount that is an option.  Updated 02/12/2022 Lynn [name] is a 71 y.o. female coming in with complaint of bilateral knee pain. Here for knee injections. Worsening pain at this time.  Does have a trip in November to [place] and would like to be able to walk better.      Past Medical History:  Diagnosis Date   Allergy    Arthritis    left knee   Basal cell carcinoma of skin 10/14/1994   Right breast   Basal cell carcinoma of skin 12/07/2011   Left inner elbow - TX p BX   Hyperlipidemia    Hypertension    Hypothyroidism    Squamous cell carcinoma of skin 12/07/2011   Left lower leg - TX p BX   Squamous cell carcinoma of skin 02/02/2017   Left anterior thigh - CX3 + 5FU   Past Surgical History:  Procedure Laterality Date   ABDOMINAL HYSTERECTOMY     c-section 1983     catract  Right 01/15/2022   CESAREAN SECTION  06/27/1981   COLONOSCOPY     EYE SURGERY  01/15/22   Cataract surgery right eye   KNEE ARTHROSCOPY     PARTIAL HYSTERECTOMY     POLYPECTOMY     TONSILLECTOMY     TUBAL LIGATION     Social History   Socioeconomic History   Marital status: Widowed    Spouse name: Not on file   Number of children: Not on file   Years of  education: Not on file   Highest education level: Not on file  Occupational History   Occupation: retired  Tobacco Use   Smoking status: Never   Smokeless tobacco: Never   Tobacco comments:    Do not and never have smoked  Vaping Use   Vaping Use: Never used  Substance and Sexual Activity   Alcohol use: Yes   Drug use: No   Sexual activity: Not Currently    Birth control/protection: Post-menopausal  Other Topics Concern   Not on file  Social History Narrative   Not on file   Social Determinants of Health   Financial Resource Strain: Low Risk  (01/29/2022)   Overall Financial Resource Strain (CARDIA)    Difficulty of Paying Living Expenses: Not hard at all  Food Insecurity: Unknown (01/29/2022)   Hunger Vital Sign    Worried About Running Out of Food in the Last Year: Not on file    Ran Out of Food in the Last Year: Never true  Transportation Needs: No Transportation Needs (01/29/2022)   PRAPARE - Hydrologist (Medical): No    Lack of Transportation (Non-Medical): No  Physical Activity: Insufficiently Active (01/29/2022)   Exercise Vital Sign  Days of Exercise per Week: 3 days    Minutes of Exercise per Session: 30 min  Stress: No Stress Concern Present (01/29/2022)   [place]    Feeling of Stress : Not at all  Social Connections: Moderately Integrated (01/29/2022)   Social Connection and Isolation Panel [NHANES]    Frequency of Communication with Friends and Family: Twice a week    Frequency of Social Gatherings with Friends and Family: Twice a week    Attends Religious Services: 1 to 4 times per year    Active Member of [employer] or Organizations: Yes    Attends Archivist Meetings: 1 to 4 times per year    Marital Status: Widowed   Allergies  Allergen Reactions   Sulfa Antibiotics Nausea Only and Nausea And Vomiting   Brimonidine Tartrate    Brimonidine Tartrate     Family History  Problem Relation Age of Onset   Colon cancer Father    Cancer Father        skin   Colon polyps Sister    Colon polyps Sister    Colon polyps Sister    Colon polyps Sister    Cancer Maternal Grandfather    Cancer Paternal Grandfather        skin   Pancreatic cancer Neg Hx    Stomach cancer Neg Hx    Esophageal cancer Neg Hx    Rectal cancer Neg Hx     Current Outpatient Medications (Endocrine & Metabolic):    levothyroxine (SYNTHROID) 100 MCG tablet, TAKE 1 TABLET BY MOUTH EVERY DAY  Current Outpatient Medications (Cardiovascular):    atorvastatin (LIPITOR) 20 MG tablet, Take 1 tablet (20 mg total) by mouth daily.   atorvastatin (LIPITOR) 40 MG tablet, Take 40 mg by mouth daily.   furosemide (LASIX) 20 MG tablet, Take 1 tablet (20 mg total) by mouth daily.  Current Outpatient Medications (Respiratory):    fluticasone (FLONASE) 50 MCG/ACT nasal spray, INSTILL 2 SPRAYS IN EACH NOSTRIL ONCE A DAY   promethazine (PHENERGAN) 25 MG tablet, TAKE 1 TABLET BY MOUTH EVERY 6 HOURS AS NEEDED FOR NAUSEA OR VOMITING.   Current Outpatient Medications (Hematological):    cyanocobalamin 1000 MCG tablet, Take 1,000 mcg by mouth daily.  Current Outpatient Medications (Other):    gabapentin (NEURONTIN) 300 MG capsule, 1 capsule 3 times a day as needed   imiquimod (ALDARA) 5 % cream, Apply topically at bedtime.   Multiple Vitamin (MULTIVITAMIN) tablet, Take 1 tablet by mouth daily.   Probiotic Product (PROBIOTIC-10) CHEW, Chew by mouth.   Reviewed prior external information including notes and imaging from  primary care provider As well as notes that were available from care everywhere and other healthcare systems.  Past medical history, social, surgical and family history all reviewed in electronic medical record.  No pertanent information unless stated regarding to the chief complaint.   Review of Systems:  No headache, visual changes, nausea, vomiting, diarrhea,  constipation, dizziness, abdominal pain, skin rash, fevers, chills, night sweats, weight loss, swollen lymph nodes, body aches, joint swelling, chest pain, shortness of breath, mood changes. POSITIVE muscle aches  Objective  Blood pressure (!) 140/98, pulse 95, height '5\' 2"'$  (1.575 m), weight 193 lb (87.5 kg), SpO2 97 %.   General: No apparent distress alert and oriented x3 mood and affect normal, dressed appropriately.  HEENT: Pupils equal, extraocular movements intact  Respiratory: Patient's speak in full sentences and does not appear short  of breath  Cardiovascular: No lower extremity edema, non tender, no erythema  To gait noted.  Patient does have instability noted on the knees bilaterally with valgus and varus force.  Abnormal thigh to calf ratio noted.  Only flexion to 95 degrees.  After informed written and verbal consent, patient was seated on exam table. Right knee was prepped with alcohol swab and utilizing anterolateral approach, patient's right knee space was injected with 48 mg per 3 mL of Monovisc (sodium hyaluronate) in a prefilled syringe was injected easily into the knee through a 22-gauge needle..Patient tolerated the procedure well without immediate complications.  After informed written and verbal consent, patient was seated on exam table. Left knee was prepped with alcohol swab and utilizing anterolateral approach, patient's left knee space was injected with 48 mg per 3 mL of Monovisc (sodium hyaluronate) in a prefilled syringe was injected easily into the knee through a 22-gauge needle..Patient tolerated the procedure well without immediate complications.    Impression and Recommendations:    The above documentation has been reviewed and is accurate and complete Lyndal Pulley, DO

## 2022-02-12 ENCOUNTER — Ambulatory Visit (INDEPENDENT_AMBULATORY_CARE_PROVIDER_SITE_OTHER): Payer: Medicare Other | Admitting: Family Medicine

## 2022-02-12 DIAGNOSIS — M170 Bilateral primary osteoarthritis of knee: Secondary | ICD-10-CM | POA: Diagnosis not present

## 2022-02-12 MED ORDER — HYALURONAN 88 MG/4ML IX SOSY
176.0000 mg | PREFILLED_SYRINGE | Freq: Once | INTRA_ARTICULAR | Status: AC
  Administered 2022-02-12: 176 mg via INTRA_ARTICULAR

## 2022-02-12 NOTE — Patient Instructions (Addendum)
Gel injections into both knees today Good to see you! Say hi to [facility]

## 2022-02-12 NOTE — Assessment & Plan Note (Signed)
Chronic problem with worsening symptoms.  Tried viscosupplementation again.  Has responded to it in the past.  Hopefully will make an improvement again at this time.  May consider repeating the steroid injection in 3 months if needed.  Patient still wants to hold on any type of surgical intervention.

## 2022-02-17 ENCOUNTER — Ambulatory Visit: Payer: Medicare Other | Admitting: Dermatology

## 2022-02-17 DIAGNOSIS — Z79890 Hormone replacement therapy (postmenopausal): Secondary | ICD-10-CM | POA: Diagnosis not present

## 2022-02-17 DIAGNOSIS — E669 Obesity, unspecified: Secondary | ICD-10-CM | POA: Diagnosis not present

## 2022-02-17 DIAGNOSIS — Z7961 Long term (current) use of immunomodulator: Secondary | ICD-10-CM | POA: Diagnosis not present

## 2022-02-17 DIAGNOSIS — M1990 Unspecified osteoarthritis, unspecified site: Secondary | ICD-10-CM | POA: Diagnosis not present

## 2022-02-17 DIAGNOSIS — Z961 Presence of intraocular lens: Secondary | ICD-10-CM | POA: Diagnosis not present

## 2022-02-17 DIAGNOSIS — H2513 Age-related nuclear cataract, bilateral: Secondary | ICD-10-CM | POA: Diagnosis not present

## 2022-02-17 DIAGNOSIS — H25812 Combined forms of age-related cataract, left eye: Secondary | ICD-10-CM | POA: Diagnosis not present

## 2022-02-17 DIAGNOSIS — E039 Hypothyroidism, unspecified: Secondary | ICD-10-CM | POA: Diagnosis not present

## 2022-02-17 DIAGNOSIS — I10 Essential (primary) hypertension: Secondary | ICD-10-CM | POA: Diagnosis not present

## 2022-02-17 DIAGNOSIS — Z6834 Body mass index (BMI) 34.0-34.9, adult: Secondary | ICD-10-CM | POA: Diagnosis not present

## 2022-02-17 DIAGNOSIS — E785 Hyperlipidemia, unspecified: Secondary | ICD-10-CM | POA: Diagnosis not present

## 2022-02-17 DIAGNOSIS — J309 Allergic rhinitis, unspecified: Secondary | ICD-10-CM | POA: Diagnosis not present

## 2022-02-17 DIAGNOSIS — Z9841 Cataract extraction status, right eye: Secondary | ICD-10-CM | POA: Diagnosis not present

## 2022-02-18 ENCOUNTER — Other Ambulatory Visit: Payer: Self-pay

## 2022-02-18 ENCOUNTER — Other Ambulatory Visit: Payer: Self-pay | Admitting: Family Medicine

## 2022-02-25 ENCOUNTER — Ambulatory Visit: Payer: Medicare Other | Admitting: Family Medicine

## 2022-02-27 ENCOUNTER — Ambulatory Visit (HOSPITAL_BASED_OUTPATIENT_CLINIC_OR_DEPARTMENT_OTHER)
Admission: RE | Admit: 2022-02-27 | Discharge: 2022-02-27 | Disposition: A | Discharge status: Home / Self Care | Payer: Medicare Other | Source: Ambulatory Visit | Attending: Family Medicine | Admitting: Family Medicine

## 2022-02-27 ENCOUNTER — Encounter (HOSPITAL_BASED_OUTPATIENT_CLINIC_OR_DEPARTMENT_OTHER): Payer: Self-pay | Admitting: Radiology

## 2022-02-27 DIAGNOSIS — Z1231 Encounter for screening mammogram for malignant neoplasm of breast: Secondary | ICD-10-CM

## 2022-03-09 ENCOUNTER — Ambulatory Visit: Payer: PRIVATE HEALTH INSURANCE | Admitting: Dermatology

## 2022-03-25 ENCOUNTER — Ambulatory Visit: Payer: Medicare Other | Admitting: Family Medicine

## 2022-04-01 NOTE — Progress Notes (Unsigned)
[place] Phone: [phone] Subjective:   Lynn [name], am serving as a scribe for Dr. Hulan Saas.  I'm seeing this patient by the request  of:  Lynn Minium, Lynn [name]  CC: Bilateral knee pain  [id number]  Lynn [name] is a 71 y.o. female coming in with complaint of B knee pain. Monovisc given on 02/12/2022. Also c/o R hip pain. Seen for pain in this joint in 2019. Patient states had eye surgery. Feels like since she has been using the eye drops her knees have gotten worse. The right is worse than the left. She also feels pain I her right hip.      Past Medical History:  Diagnosis Date   Allergy    Arthritis    left knee   Basal cell carcinoma of skin 10/14/1994   Right breast   Basal cell carcinoma of skin 12/07/2011   Left inner elbow - TX p BX   Hyperlipidemia    Hypertension    Hypothyroidism    Squamous cell carcinoma of skin 12/07/2011   Left lower leg - TX p BX   Squamous cell carcinoma of skin 02/02/2017   Left anterior thigh - CX3 + 5FU   Past Surgical History:  Procedure Laterality Date   ABDOMINAL HYSTERECTOMY     c-section 1983     catract  Right 01/15/2022   CESAREAN SECTION  06/27/1981   COLONOSCOPY     EYE SURGERY  01/15/22   Cataract surgery right eye   KNEE ARTHROSCOPY     PARTIAL HYSTERECTOMY     POLYPECTOMY     TONSILLECTOMY     TUBAL LIGATION     Social History   Socioeconomic History   Marital status: Widowed    Spouse name: Not on file   Number of children: Not on file   Years of education: Not on file   Highest education level: Not on file  Occupational History   Occupation: retired  Tobacco Use   Smoking status: Never   Smokeless tobacco: Never   Tobacco comments:    Do not and never have smoked  Vaping Use   Vaping Use: Never used  Substance and Sexual Activity   Alcohol use: Yes   Drug use: [name]   Sexual activity: Not Currently    Birth  control/protection: Post-menopausal  Other Topics Concern   Not on file  Social History Narrative   Not on file   Social Determinants of Health   Financial Resource Strain: Low Risk  (01/29/2022)   Overall Financial Resource Strain (CARDIA)    Difficulty of Paying Living Expenses: Not hard at all  Food Insecurity: Unknown (01/29/2022)   Hunger Vital Sign    Worried About Running Out of Food in the Last Year: Not on file    Ran Out of Food in the Last Year: Never true  Transportation Needs: [name] Transportation Needs (01/29/2022)   PRAPARE - Hydrologist (Medical): [name]    Lack of Transportation (Non-Medical): [name]  Physical Activity: Insufficiently Active (01/29/2022)   Exercise Vital Sign    Days of Exercise per Week: 3 days    Minutes of Exercise per Session: 30 min  Stress: [name] Stress Concern Present (01/29/2022)   [place]    Feeling of Stress : Not at all  Social Connections: Moderately Integrated (01/29/2022)   Social Connection  and Isolation Panel [NHANES]    Frequency of Communication with Friends and Family: Twice a week    Frequency of Social Gatherings with Friends and Family: Twice a week    Attends Religious Services: 1 to 4 times per year    Active Member of [employer] or Organizations: Yes    Attends Archivist Meetings: 1 to 4 times per year    Marital Status: Widowed   Allergies  Allergen Reactions   Sulfa Antibiotics Nausea Only and Nausea And Vomiting   Brimonidine Tartrate    Brimonidine Tartrate    Family History  Problem Relation Age of Onset   Colon cancer Father    Cancer Father        skin   Colon polyps Sister    Colon polyps Sister    Colon polyps Sister    Colon polyps Sister    Cancer Maternal Grandfather    Cancer Paternal Grandfather        skin   Pancreatic cancer Neg Hx    Stomach cancer Neg Hx    Esophageal cancer Neg Hx    Rectal cancer Neg  Hx     Current Outpatient Medications (Endocrine & Metabolic):    levothyroxine (SYNTHROID) 100 MCG tablet, TAKE 1 TABLET BY MOUTH EVERY DAY  Current Outpatient Medications (Cardiovascular):    atorvastatin (LIPITOR) 20 MG tablet, TAKE 1 TABLET BY MOUTH EVERY DAY   atorvastatin (LIPITOR) 40 MG tablet, Take 40 mg by mouth daily.   furosemide (LASIX) 20 MG tablet, Take 1 tablet (20 mg total) by mouth daily.  Current Outpatient Medications (Respiratory):    fluticasone (FLONASE) 50 MCG/ACT nasal spray, INSTILL 2 SPRAYS IN EACH NOSTRIL ONCE A DAY   promethazine (PHENERGAN) 25 MG tablet, TAKE 1 TABLET BY MOUTH EVERY 6 HOURS AS NEEDED FOR NAUSEA OR VOMITING.   Current Outpatient Medications (Hematological):    cyanocobalamin 1000 MCG tablet, Take 1,000 mcg by mouth daily.  Current Outpatient Medications (Other):    gabapentin (NEURONTIN) 300 MG capsule, 1 capsule 3 times a day as needed   imiquimod (ALDARA) 5 % cream, Apply topically at bedtime.   Multiple Vitamin (MULTIVITAMIN) tablet, Take 1 tablet by mouth daily.   Probiotic Product (PROBIOTIC-10) CHEW, Chew by mouth.   Reviewed prior external information including notes and imaging from  primary care provider As well as notes that were available from care everywhere and other healthcare systems.  Past medical history, social, surgical and family history all reviewed in electronic medical record.  [name] pertanent information unless stated regarding to the chief complaint.   Review of Systems:  [name] headache, visual changes, nausea, vomiting, diarrhea, constipation, dizziness, abdominal pain, skin rash, fevers, chills, night sweats, weight loss, swollen lymph nodes, , chest pain, shortness of breath, mood changes. POSITIVE muscle aches, body aches, joint swelling  Objective  Blood pressure (!) 134/90, pulse 96, height '5\' 2"'$  (1.575 m), weight 196 lb (88.9 kg), SpO2 98 %.   General: [name] apparent distress alert and oriented x3 mood and  affect normal, dressed appropriately.  HEENT: Pupils equal, extraocular movements intact  Respiratory: Patient's speak in full sentences and does not appear short of breath  Cardiovascular: Trace lower extremity edema, non tender, [name] erythema  Is bilaterally do have trace effusion noted bilaterally.  Crepitus noted.  Instability noted with valgus and varus force.  After informed written and verbal consent, patient was seated on exam table. Right knee was prepped with alcohol swab and utilizing anterolateral approach,  patient's right knee space was injected with 4:1  marcaine 0.5%: Kenalog '40mg'$ /dL. Patient tolerated the procedure well without immediate complications.  After informed written and verbal consent, patient was seated on exam table. Left knee was prepped with alcohol swab and utilizing anterolateral approach, patient's left knee space was injected with 4:1  marcaine 0.5%: Kenalog '40mg'$ /dL. Patient tolerated the procedure well without immediate complications.   Impression and Recommendations:

## 2022-04-02 ENCOUNTER — Ambulatory Visit (INDEPENDENT_AMBULATORY_CARE_PROVIDER_SITE_OTHER): Payer: Medicare Other | Admitting: Family Medicine

## 2022-04-02 DIAGNOSIS — M170 Bilateral primary osteoarthritis of knee: Secondary | ICD-10-CM | POA: Diagnosis not present

## 2022-04-02 NOTE — Patient Instructions (Signed)
Injection in both knees today Good to see you! Bone Density schedule See you again in 2-3 months

## 2022-04-02 NOTE — Assessment & Plan Note (Addendum)
Chronic problem with worsening symptoms.  Patient knows that at some point she may need to consider the possibility of surgical intervention.  Patient's BMI is at 35 and would be a candidate if necessary.  Encouraged her to continue to work on weight loss, strengthening of the knees.  Patient will be traveling now and is unable to do anything more aggressive at this time and is hoping that it will work.  Follow-up with me again in 2 to 3 months

## 2022-04-23 ENCOUNTER — Other Ambulatory Visit: Payer: Self-pay | Admitting: Family Medicine

## 2022-04-27 ENCOUNTER — Ambulatory Visit: Payer: Medicare Other | Admitting: Family Medicine

## 2022-04-28 ENCOUNTER — Inpatient Hospital Stay: Admission: RE | Admit: 2022-04-28 | Payer: Medicare Other | Source: Ambulatory Visit

## 2022-05-07 ENCOUNTER — Other Ambulatory Visit: Payer: Self-pay | Admitting: Family Medicine

## 2022-05-07 DIAGNOSIS — Z780 Asymptomatic menopausal state: Secondary | ICD-10-CM

## 2022-05-07 NOTE — Progress Notes (Signed)
Order for DEXA entered as requested

## 2022-05-08 DIAGNOSIS — L089 Local infection of the skin and subcutaneous tissue, unspecified: Secondary | ICD-10-CM | POA: Diagnosis not present

## 2022-05-08 DIAGNOSIS — I10 Essential (primary) hypertension: Secondary | ICD-10-CM | POA: Diagnosis not present

## 2022-05-08 DIAGNOSIS — J3489 Other specified disorders of nose and nasal sinuses: Secondary | ICD-10-CM | POA: Diagnosis not present

## 2022-05-08 DIAGNOSIS — Z789 Other specified health status: Secondary | ICD-10-CM | POA: Diagnosis not present

## 2022-05-13 ENCOUNTER — Ambulatory Visit (INDEPENDENT_AMBULATORY_CARE_PROVIDER_SITE_OTHER)
Admission: RE | Admit: 2022-05-13 | Discharge: 2022-05-13 | Disposition: A | Discharge status: Home / Self Care | Payer: Medicare Other | Source: Ambulatory Visit | Attending: Family Medicine | Admitting: Family Medicine

## 2022-05-13 DIAGNOSIS — Z780 Asymptomatic menopausal state: Secondary | ICD-10-CM | POA: Diagnosis not present

## 2022-05-14 ENCOUNTER — Ambulatory Visit: Payer: Medicare Other | Admitting: Family Medicine

## 2022-05-18 ENCOUNTER — Telehealth: Payer: Self-pay

## 2022-05-18 NOTE — Telephone Encounter (Signed)
Pt seen results Via my chart  

## 2022-05-18 NOTE — Telephone Encounter (Signed)
-----   Message from [name], MD sent at 05/17/2022  9:10 AM EST ----- Your bone density shows osteopenia (thinning bone but not as severe as osteoporosis).  Please make sure you are taking daily Calcium and Vit D

## 2022-06-05 NOTE — Progress Notes (Deleted)
Lynn [name] Phone: [phone] Subjective:    I'm seeing this patient by the request  of:  [name], MD  CC:   [id number]  04/02/2022 Chronic problem with worsening symptoms.  Patient knows that at some point she may need to consider the possibility of surgical intervention.  Patient's BMI is at 35 and would be a candidate if necessary.  Encouraged her to continue to work on weight loss, strengthening of the knees.  Patient will be traveling now and is unable to do anything more aggressive at this time and is hoping that it will work.  Follow-up with me again in 2 to 3 months     Update 06/11/2022 Lynn [name] is a 71 y.o. female coming in with complaint of R knee pain. Patient states       Past Medical History:  Diagnosis Date   Allergy    Arthritis    left knee   Basal cell carcinoma of skin 10/14/1994   Right breast   Basal cell carcinoma of skin 12/07/2011   Left inner elbow - TX p BX   Hyperlipidemia    Hypertension    Hypothyroidism    Squamous cell carcinoma of skin 12/07/2011   Left lower leg - TX p BX   Squamous cell carcinoma of skin 02/02/2017   Left anterior thigh - CX3 + 5FU   Past Surgical History:  Procedure Laterality Date   ABDOMINAL HYSTERECTOMY     c-section 1983     catract  Right 01/15/2022   CESAREAN SECTION  06/27/1981   COLONOSCOPY     EYE SURGERY  01/15/22   Cataract surgery right eye   KNEE ARTHROSCOPY     PARTIAL HYSTERECTOMY     POLYPECTOMY     TONSILLECTOMY     TUBAL LIGATION     Social History   Socioeconomic History   Marital status: Widowed    Spouse name: Not on file   Number of children: Not on file   Years of education: Not on file   Highest education level: Not on file  Occupational History   Occupation: retired  Tobacco Use   Smoking status: Never   Smokeless tobacco: Never   Tobacco comments:    Do not and never have smoked   Vaping Use   Vaping Use: Never used  Substance and Sexual Activity   Alcohol use: Yes   Drug use: No   Sexual activity: Not Currently    Birth control/protection: Post-menopausal  Other Topics Concern   Not on file  Social History Narrative   Not on file   Social Determinants of Health   Financial Resource Strain: Low Risk  (01/29/2022)   Overall Financial Resource Strain (CARDIA)    Difficulty of Paying Living Expenses: Not hard at all  Food Insecurity: Unknown (01/29/2022)   Hunger Vital Sign    Worried About Running Out of Food in the Last Year: Not on file    Ran Out of Food in the Last Year: Never true  Transportation Needs: No Transportation Needs (01/29/2022)   PRAPARE - Hydrologist (Medical): No    Lack of Transportation (Non-Medical): No  Physical Activity: Insufficiently Active (01/29/2022)   Exercise Vital Sign    Days of Exercise per Week: 3 days    Minutes of Exercise per Session: 30 min  Stress: No Stress Concern Present (01/29/2022)   [employer] - Occupational Stress Questionnaire    Feeling of Stress : Not at all  Social Connections: Moderately Integrated (01/29/2022)   Social Connection and Isolation Panel [NHANES]    Frequency of Communication with Friends and Family: Twice a week    Frequency of Social Gatherings with Friends and Family: Twice a week    Attends Religious Services: 1 to 4 times per year    Active Member of [employer] or Organizations: Yes    Attends Archivist Meetings: 1 to 4 times per year    Marital Status: Widowed   Allergies  Allergen Reactions   Sulfa Antibiotics Nausea Only and Nausea And Vomiting   Brimonidine Tartrate    Brimonidine Tartrate    Family History  Problem Relation Age of Onset   Colon cancer Father    Cancer Father        skin   Colon polyps Sister    Colon polyps Sister    Colon polyps Sister    Colon polyps Sister    Cancer Maternal Grandfather     Cancer Paternal Grandfather        skin   Pancreatic cancer Neg Hx    Stomach cancer Neg Hx    Esophageal cancer Neg Hx    Rectal cancer Neg Hx     Current Outpatient Medications (Endocrine & Metabolic):    levothyroxine (SYNTHROID) 100 MCG tablet, TAKE 1 TABLET BY MOUTH EVERY DAY  Current Outpatient Medications (Cardiovascular):    atorvastatin (LIPITOR) 20 MG tablet, TAKE 1 TABLET BY MOUTH EVERY DAY   atorvastatin (LIPITOR) 40 MG tablet, Take 40 mg by mouth daily.   furosemide (LASIX) 20 MG tablet, Take 1 tablet (20 mg total) by mouth daily.  Current Outpatient Medications (Respiratory):    fluticasone (FLONASE) 50 MCG/ACT nasal spray, INSTILL 2 SPRAYS IN EACH NOSTRIL ONCE A DAY   promethazine (PHENERGAN) 25 MG tablet, TAKE 1 TABLET BY MOUTH EVERY 6 HOURS AS NEEDED FOR NAUSEA OR VOMITING.   Current Outpatient Medications (Hematological):    cyanocobalamin 1000 MCG tablet, Take 1,000 mcg by mouth daily.  Current Outpatient Medications (Other):    gabapentin (NEURONTIN) 300 MG capsule, 1 capsule 3 times a day as needed   imiquimod (ALDARA) 5 % cream, Apply topically at bedtime.   Multiple Vitamin (MULTIVITAMIN) tablet, Take 1 tablet by mouth daily.   Probiotic Product (PROBIOTIC-10) CHEW, Chew by mouth.   Reviewed prior external information including notes and imaging from  primary care provider As well as notes that were available from care everywhere and other healthcare systems.  Past medical history, social, surgical and family history all reviewed in electronic medical record.  No pertanent information unless stated regarding to the chief complaint.   Review of Systems:  No headache, visual changes, nausea, vomiting, diarrhea, constipation, dizziness, abdominal pain, skin rash, fevers, chills, night sweats, weight loss, swollen lymph nodes, body aches, joint swelling, chest pain, shortness of breath, mood changes. POSITIVE muscle aches  Objective  There were no  vitals taken for this visit.   General: No apparent distress alert and oriented x3 mood and affect normal, dressed appropriately.  HEENT: Pupils equal, extraocular movements intact  Respiratory: Patient's speak in full sentences and does not appear short of breath  Cardiovascular: No lower extremity edema, non tender, no erythema      Impression and Recommendations:

## 2022-06-06 ENCOUNTER — Other Ambulatory Visit: Payer: Self-pay | Admitting: Family Medicine

## 2022-06-11 ENCOUNTER — Ambulatory Visit: Payer: Medicare Other | Admitting: Family Medicine

## 2022-06-12 ENCOUNTER — Ambulatory Visit: Payer: Medicare Other | Admitting: Family Medicine

## 2022-07-06 ENCOUNTER — Ambulatory Visit: Payer: Medicare Other | Admitting: Family Medicine

## 2022-07-20 ENCOUNTER — Other Ambulatory Visit: Payer: Self-pay | Admitting: Family Medicine

## 2022-07-23 NOTE — Progress Notes (Deleted)
Lynn [name] Phone: [phone] Subjective:    I'm seeing this patient by the request  of:  [name], MD  CC:   [id number]  04/02/2022 Chronic problem with worsening symptoms. Patient knows that at some point she may need to consider the possibility of surgical intervention. Patient's BMI is at 35 and would be a candidate if necessary. Encouraged her to continue to work on weight loss, strengthening of the knees. Patient will be traveling now and is unable to do anything more aggressive at this time and is hoping that it will work. Follow-up with me again in 2 to 3 months   Updated 07/28/2022 Lynn [name] is a 72 y.o. female coming in with complaint of knee pain  Onset-  Location Duration-  Character- Aggravating factors- Reliving factors-  Therapies tried-  Severity-     Past Medical History:  Diagnosis Date   Allergy    Arthritis    left knee   Basal cell carcinoma of skin 10/14/1994   Right breast   Basal cell carcinoma of skin 12/07/2011   Left inner elbow - TX p BX   Hyperlipidemia    Hypertension    Hypothyroidism    Squamous cell carcinoma of skin 12/07/2011   Left lower leg - TX p BX   Squamous cell carcinoma of skin 02/02/2017   Left anterior thigh - CX3 + 5FU   Past Surgical History:  Procedure Laterality Date   ABDOMINAL HYSTERECTOMY     c-section 1983     catract  Right 01/15/2022   CESAREAN SECTION  06/27/1981   COLONOSCOPY     EYE SURGERY  01/15/22   Cataract surgery right eye   KNEE ARTHROSCOPY     PARTIAL HYSTERECTOMY     POLYPECTOMY     TONSILLECTOMY     TUBAL LIGATION     Social History   Socioeconomic History   Marital status: Widowed    Spouse name: Not on file   Number of children: Not on file   Years of education: Not on file   Highest education level: Not on file  Occupational History   Occupation: retired  Tobacco Use   Smoking status:  Never   Smokeless tobacco: Never   Tobacco comments:    Do not and never have smoked  Vaping Use   Vaping Use: Never used  Substance and Sexual Activity   Alcohol use: Yes   Drug use: No   Sexual activity: Not Currently    Birth control/protection: Post-menopausal  Other Topics Concern   Not on file  Social History Narrative   Not on file   Social Determinants of Health   Financial Resource Strain: Low Risk  (01/29/2022)   Overall Financial Resource Strain (CARDIA)    Difficulty of Paying Living Expenses: Not hard at all  Food Insecurity: Unknown (01/29/2022)   Hunger Vital Sign    Worried About Running Out of Food in the Last Year: Not on file    Ran Out of Food in the Last Year: Never true  Transportation Needs: No Transportation Needs (01/29/2022)   PRAPARE - Hydrologist (Medical): No    Lack of Transportation (Non-Medical): No  Physical Activity: Insufficiently Active (01/29/2022)   Exercise Vital Sign    Days of Exercise per Week: 3 days    Minutes of Exercise per Session: 30 min  Stress: No Stress Concern Present (01/29/2022)  [employer] of Occupational Health - Occupational Stress Questionnaire    Feeling of Stress : Not at all  Social Connections: Moderately Integrated (01/29/2022)   Social Connection and Isolation Panel [NHANES]    Frequency of Communication with Friends and Family: Twice a week    Frequency of Social Gatherings with Friends and Family: Twice a week    Attends Religious Services: 1 to 4 times per year    Active Member of [employer] or Organizations: Yes    Attends Archivist Meetings: 1 to 4 times per year    Marital Status: Widowed   Allergies  Allergen Reactions   Sulfa Antibiotics Nausea Only and Nausea And Vomiting   Brimonidine Tartrate    Brimonidine Tartrate    Family History  Problem Relation Age of Onset   Colon cancer Father    Cancer Father        skin   Colon polyps Sister    Colon  polyps Sister    Colon polyps Sister    Colon polyps Sister    Cancer Maternal Grandfather    Cancer Paternal Grandfather        skin   Pancreatic cancer Neg Hx    Stomach cancer Neg Hx    Esophageal cancer Neg Hx    Rectal cancer Neg Hx     Current Outpatient Medications (Endocrine & Metabolic):    levothyroxine (SYNTHROID) 100 MCG tablet, TAKE 1 TABLET BY MOUTH EVERY DAY  Current Outpatient Medications (Cardiovascular):    atorvastatin (LIPITOR) 20 MG tablet, TAKE 1 TABLET BY MOUTH EVERY DAY   atorvastatin (LIPITOR) 40 MG tablet, Take 40 mg by mouth daily.   furosemide (LASIX) 20 MG tablet, TAKE 1 TABLET BY MOUTH EVERY DAY  Current Outpatient Medications (Respiratory):    fluticasone (FLONASE) 50 MCG/ACT nasal spray, INSTILL 2 SPRAYS IN EACH NOSTRIL ONCE A DAY   promethazine (PHENERGAN) 25 MG tablet, TAKE 1 TABLET BY MOUTH EVERY 6 HOURS AS NEEDED FOR NAUSEA OR VOMITING.   Current Outpatient Medications (Hematological):    cyanocobalamin 1000 MCG tablet, Take 1,000 mcg by mouth daily.  Current Outpatient Medications (Other):    gabapentin (NEURONTIN) 300 MG capsule, 1 capsule 3 times a day as needed   imiquimod (ALDARA) 5 % cream, Apply topically at bedtime.   Multiple Vitamin (MULTIVITAMIN) tablet, Take 1 tablet by mouth daily.   Probiotic Product (PROBIOTIC-10) CHEW, Chew by mouth.   Reviewed prior external information including notes and imaging from  primary care provider As well as notes that were available from care everywhere and other healthcare systems.  Past medical history, social, surgical and family history all reviewed in electronic medical record.  No pertanent information unless stated regarding to the chief complaint.   Review of Systems:  No headache, visual changes, nausea, vomiting, diarrhea, constipation, dizziness, abdominal pain, skin rash, fevers, chills, night sweats, weight loss, swollen lymph nodes, body aches, joint swelling, chest pain,  shortness of breath, mood changes. POSITIVE muscle aches  Objective  There were no vitals taken for this visit.   General: No apparent distress alert and oriented x3 mood and affect normal, dressed appropriately.  HEENT: Pupils equal, extraocular movements intact  Respiratory: Patient's speak in full sentences and does not appear short of breath  Cardiovascular: No lower extremity edema, non tender, no erythema      Impression and Recommendations:

## 2022-07-28 ENCOUNTER — Ambulatory Visit: Payer: Medicare Other | Admitting: Family Medicine

## 2022-07-30 ENCOUNTER — Other Ambulatory Visit: Payer: Self-pay | Admitting: Family Medicine

## 2022-08-03 ENCOUNTER — Ambulatory Visit: Payer: Medicare Other | Admitting: Family Medicine

## 2022-08-03 DIAGNOSIS — H26493 Other secondary cataract, bilateral: Secondary | ICD-10-CM | POA: Diagnosis not present

## 2022-08-03 DIAGNOSIS — H33311 Horseshoe tear of retina without detachment, right eye: Secondary | ICD-10-CM | POA: Diagnosis not present

## 2022-08-03 DIAGNOSIS — H33301 Unspecified retinal break, right eye: Secondary | ICD-10-CM | POA: Diagnosis not present

## 2022-08-20 NOTE — Progress Notes (Signed)
[place] [facility] [employer] Phone: [phone] Subjective:   [name], am serving as a scribe for Dr. Hulan Saas.  I'm seeing this patient by the request  of:  [name], MD  CC: Leg pain and knee pain  [id number]  04/02/2022 Chronic problem with worsening symptoms. Patient knows that at some point she may need to consider the possibility of surgical intervention. Patient's BMI is at 35 and would be a candidate if necessary. Encouraged her to continue to work on weight loss, strengthening of the knees. Patient will be traveling now and is unable to do anything more aggressive at this time and is hoping that it will work. Follow-up with me again in 2 to 3 month   Updated 08/24/2022 Lynn [name] is a 72 y.o. female coming in with complaint of B leg pain for past 2 months. Pain worse at night when she lies down in bed. Using heating pad and Tylenol for pain relief. Denies any tingling. Had a prescription from another provider which has been helpful to decrease her pain.      Past Medical History:  Diagnosis Date   Allergy    Arthritis    left knee   Basal cell carcinoma of skin 10/14/1994   Right breast   Basal cell carcinoma of skin 12/07/2011   Left inner elbow - TX p BX   Hyperlipidemia    Hypertension    Hypothyroidism    Squamous cell carcinoma of skin 12/07/2011   Left lower leg - TX p BX   Squamous cell carcinoma of skin 02/02/2017   Left anterior thigh - CX3 + 5FU   Past Surgical History:  Procedure Laterality Date   ABDOMINAL HYSTERECTOMY     c-section 1983     catract  Right 01/15/2022   CESAREAN SECTION  06/27/1981   COLONOSCOPY     EYE SURGERY  01/15/22   Cataract surgery right eye   KNEE ARTHROSCOPY     PARTIAL HYSTERECTOMY     POLYPECTOMY     TONSILLECTOMY     TUBAL LIGATION     Social History   Socioeconomic History   Marital status: Widowed    Spouse name: Not on file    Number of children: Not on file   Years of education: Not on file   Highest education level: Not on file  Occupational History   Occupation: retired  Tobacco Use   Smoking status: Never   Smokeless tobacco: Never   Tobacco comments:    Do not and never have smoked  Vaping Use   Vaping Use: Never used  Substance and Sexual Activity   Alcohol use: Yes   Drug use: No   Sexual activity: Not Currently    Birth control/protection: Post-menopausal  Other Topics Concern   Not on file  Social History Narrative   Not on file   Social Determinants of Health   Financial Resource Strain: Low Risk  (01/29/2022)   Overall Financial Resource Strain (CARDIA)    Difficulty of Paying Living Expenses: Not hard at all  Food Insecurity: Unknown (01/29/2022)   Hunger Vital Sign    Worried About Running Out of Food in the Last Year: Not on file    Ran Out of Food in the Last Year: Never true  Transportation Needs: No Transportation Needs (01/29/2022)   PRAPARE - Transportation    Lack of Transportation (Medical): No    Lack of  Transportation (Non-Medical): No  Physical Activity: Insufficiently Active (01/29/2022)   Exercise Vital Sign    Days of Exercise per Week: 3 days    Minutes of Exercise per Session: 30 min  Stress: No Stress Concern Present (01/29/2022)   [place]    Feeling of Stress : Not at all  Social Connections: Moderately Integrated (01/29/2022)   Social Connection and Isolation Panel [NHANES]    Frequency of Communication with Friends and Family: Twice a week    Frequency of Social Gatherings with Friends and Family: Twice a week    Attends Religious Services: 1 to 4 times per year    Active Member of [employer] or Organizations: Yes    Attends Archivist Meetings: 1 to 4 times per year    Marital Status: Widowed   Allergies  Allergen Reactions   Sulfa Antibiotics Nausea Only and Nausea And Vomiting    Brimonidine Tartrate    Brimonidine Tartrate    Family History  Problem Relation Age of Onset   Colon cancer Father    Cancer Father        skin   Colon polyps Sister    Colon polyps Sister    Colon polyps Sister    Colon polyps Sister    Cancer Maternal Grandfather    Cancer Paternal Grandfather        skin   Pancreatic cancer Neg Hx    Stomach cancer Neg Hx    Esophageal cancer Neg Hx    Rectal cancer Neg Hx     Current Outpatient Medications (Endocrine & Metabolic):    levothyroxine (SYNTHROID) 100 MCG tablet, TAKE 1 TABLET BY MOUTH EVERY DAY  Current Outpatient Medications (Cardiovascular):    atorvastatin (LIPITOR) 20 MG tablet, TAKE 1 TABLET BY MOUTH EVERY DAY   furosemide (LASIX) 20 MG tablet, TAKE 1 TABLET BY MOUTH EVERY DAY   atorvastatin (LIPITOR) 40 MG tablet, Take 40 mg by mouth daily.  Current Outpatient Medications (Respiratory):    fluticasone (FLONASE) 50 MCG/ACT nasal spray, INSTILL 2 SPRAYS IN EACH NOSTRIL ONCE A DAY   promethazine (PHENERGAN) 25 MG tablet, TAKE 1 TABLET BY MOUTH EVERY 6 HOURS AS NEEDED FOR NAUSEA OR VOMITING.   Current Outpatient Medications (Hematological):    cyanocobalamin 1000 MCG tablet, Take 1,000 mcg by mouth daily.  Current Outpatient Medications (Other):    DULoxetine (CYMBALTA) 20 MG capsule, Take 1 capsule (20 mg total) by mouth daily.   gabapentin (NEURONTIN) 300 MG capsule, 1 capsule 3 times a day as needed   imiquimod (ALDARA) 5 % cream, Apply topically at bedtime.   Multiple Vitamin (MULTIVITAMIN) tablet, Take 1 tablet by mouth daily.   Probiotic Product (PROBIOTIC-10) CHEW, Chew by mouth.   Reviewed prior external information including notes and imaging from  primary care provider As well as notes that were available from care everywhere and other healthcare systems.  Past medical history, social, surgical and family history all reviewed in electronic medical record.  No pertanent information unless stated regarding  to the chief complaint.   Review of Systems:  No headache, visual changes, nausea, vomiting, diarrhea, constipation, dizziness, abdominal pain, skin rash, fevers, chills, night sweats, weight loss, swollen lymph nodes, body aches, joint swelling, chest pain, shortness of breath, mood changes. POSITIVE muscle aches  Objective  Blood pressure (!) 130/96, pulse 85, height 5\' 2"  (1.575 m), weight 196 lb (88.9 kg), SpO2 96 %.   General: No apparent distress  alert and oriented x3 mood and affect normal, dressed appropriately.  HEENT: Pupils equal, extraocular movements intact  Respiratory: Patient's speak in full sentences and does not appear short of breath  Cardiovascular: No lower extremity edema, non tender, no erythema  Bilateral knees do have some crepitus noted.  Some instability noted patellofemoral joint and the medial joint line bilaterally  After informed written and verbal consent, patient was seated on exam table. Right knee was prepped with alcohol swab and utilizing anterolateral approach, patient's right knee space was injected with 4:1  marcaine 0.5%: Kenalog 40mg /dL. Patient tolerated the procedure well without immediate complications.  After informed written and verbal consent, patient was seated on exam table. Left knee was prepped with alcohol swab and utilizing anterolateral approach, patient's left knee space was injected with 4:1  marcaine 0.5%: Kenalog 40mg /dL. Patient tolerated the procedure well without immediate complications.    Impression and Recommendations:    The above documentation has been reviewed and is accurate and complete Lyndal Pulley, DO

## 2022-08-24 ENCOUNTER — Encounter: Payer: Self-pay | Admitting: Family Medicine

## 2022-08-24 ENCOUNTER — Ambulatory Visit (INDEPENDENT_AMBULATORY_CARE_PROVIDER_SITE_OTHER): Payer: Medicare Other

## 2022-08-24 ENCOUNTER — Ambulatory Visit (INDEPENDENT_AMBULATORY_CARE_PROVIDER_SITE_OTHER): Payer: Medicare Other | Admitting: Family Medicine

## 2022-08-24 VITALS — BP 130/96 | HR 85 | Ht 62.0 in | Wt 196.0 lb

## 2022-08-24 DIAGNOSIS — M5136 Other intervertebral disc degeneration, lumbar region: Secondary | ICD-10-CM | POA: Diagnosis not present

## 2022-08-24 DIAGNOSIS — M170 Bilateral primary osteoarthritis of knee: Secondary | ICD-10-CM | POA: Diagnosis not present

## 2022-08-24 DIAGNOSIS — M545 Low back pain, unspecified: Secondary | ICD-10-CM

## 2022-08-24 DIAGNOSIS — M51369 Other intervertebral disc degeneration, lumbar region without mention of lumbar back pain or lower extremity pain: Secondary | ICD-10-CM | POA: Insufficient documentation

## 2022-08-24 MED ORDER — DULOXETINE HCL 20 MG PO CPEP: 20.0000 mg | ORAL_CAPSULE | Freq: Every day | ORAL | 3 refills | Status: DC

## 2022-08-24 NOTE — Assessment & Plan Note (Signed)
X-rays ordered today and does show that patient does have spondylolisthesis likely causing some spinal stenosis which is consistent with patient's symptoms.  Discussed with patient that Cymbalta could be helpful and continue to take the gabapentin at night.  Warned of potential side effects.  Patient will read the also information given by the pharmacist.  Discussed with patient at great length about icing regimen and home exercises otherwise.  Worsening pain may need advanced imaging and epidurals.  Patient is in agreement with the plan and given exercises to avoid repetitive extension of the back follow-up again in 6 to 8 weeks

## 2022-08-24 NOTE — Patient Instructions (Signed)
Injected both knees today Cymbalta 20 mg daily to help with the back pain and leg pain Worsening pain do need to consider the possibility of an MRI of your back. Follow-up with me again in 2 months

## 2022-08-24 NOTE — Assessment & Plan Note (Signed)
Bilateral injections given today for this chronic worsening pain.  Could be potentially playing some role in the leg pain.  Concern now that patient is also having more of the spinal stenosis that could be contributing.  Patient is already on gabapentin and will start Cymbalta.  Discussed icing regimen and home exercises.  Increase activity slowly.  Follow-up with me again in 6 to 8 weeks

## 2022-09-11 ENCOUNTER — Other Ambulatory Visit: Payer: Self-pay

## 2022-09-11 ENCOUNTER — Telehealth: Payer: Self-pay | Admitting: Family Medicine

## 2022-09-11 MED ORDER — GABAPENTIN 100 MG PO CAPS: 200.0000 mg | ORAL_CAPSULE | Freq: Three times a day (TID) | ORAL | 0 refills | Status: DC | PRN

## 2022-09-11 NOTE — Telephone Encounter (Signed)
Pt cannot continue the Cymbalta we prescribed as it keeps her awake.  Has previously taken Gabapentin at bedtime which helped, can we prescribe this?

## 2022-09-11 NOTE — Telephone Encounter (Signed)
Correction:Last OV with Lynn [name] 08/24/2022

## 2022-09-11 NOTE — Telephone Encounter (Signed)
Sent patient MyChart message regarding Rx.

## 2022-09-16 ENCOUNTER — Other Ambulatory Visit: Payer: Self-pay | Admitting: Family Medicine

## 2022-09-23 DIAGNOSIS — H33311 Horseshoe tear of retina without detachment, right eye: Secondary | ICD-10-CM | POA: Diagnosis not present

## 2022-09-23 DIAGNOSIS — H2640 Unspecified secondary cataract: Secondary | ICD-10-CM | POA: Diagnosis not present

## 2022-09-24 DIAGNOSIS — I10 Essential (primary) hypertension: Secondary | ICD-10-CM | POA: Diagnosis not present

## 2022-09-24 DIAGNOSIS — Z6834 Body mass index (BMI) 34.0-34.9, adult: Secondary | ICD-10-CM | POA: Diagnosis not present

## 2022-09-24 DIAGNOSIS — J0140 Acute pansinusitis, unspecified: Secondary | ICD-10-CM | POA: Diagnosis not present

## 2022-10-12 DIAGNOSIS — H26493 Other secondary cataract, bilateral: Secondary | ICD-10-CM | POA: Diagnosis not present

## 2022-10-22 NOTE — Progress Notes (Signed)
[name] [address] [place] [address] Phone: [phone] Subjective:    I'm seeing this patient by the request  of:  Lynn Hatch, MD  CC: Bilateral knee pain  [id number]  08/24/2022 X-rays ordered today and does show that patient does have spondylolisthesis likely causing some spinal stenosis which is consistent with patient's symptoms. Discussed with patient that Cymbalta could be helpful and continue to take the gabapentin at night. Warned of potential side effects. Patient will read the also information given by the pharmacist. Discussed with patient at great length about icing regimen and home exercises otherwise. Worsening pain may need advanced imaging and epidurals. Patient is in agreement with the plan and given exercises to avoid repetitive extension of the back follow-up again in 6 to 8 weeks   Bilateral injections given today for this chronic worsening pain. Could be potentially playing some role in the leg pain. Concern now that patient is also having more of the spinal stenosis that could be contributing. Patient is already on gabapentin and will start Cymbalta. Discussed icing regimen and home exercises. Increase activity slowly. Follow-up with me again in 6 to 8 weeks   Updated 10/26/2022 Lynn [name] is a 72 y.o. female coming in with complaint of back and bilateral knee. Notes 75% improvement since starting on new medication. Continues to have bilat knee pain. Intermittent swelling. Mechanical sx present. Some pain at medial aspect of left lower leg, has been using heat prn. Wonders if PT would be helpful for knee sx.       Past Medical History:  Diagnosis Date   Allergy    Arthritis    left knee   Basal cell carcinoma of skin 10/14/1994   Right breast   Basal cell carcinoma of skin 12/07/2011   Left inner elbow - TX p BX   Hyperlipidemia    Hypertension    Hypothyroidism    Squamous cell carcinoma of skin  12/07/2011   Left lower leg - TX p BX   Squamous cell carcinoma of skin 02/02/2017   Left anterior thigh - CX3 + 5FU   Past Surgical History:  Procedure Laterality Date   ABDOMINAL HYSTERECTOMY     c-section 1983     catract  Right 01/15/2022   CESAREAN SECTION  06/27/1981   COLONOSCOPY     EYE SURGERY  01/15/22   Cataract surgery right eye   KNEE ARTHROSCOPY     PARTIAL HYSTERECTOMY     POLYPECTOMY     TONSILLECTOMY     TUBAL LIGATION     Social History   Socioeconomic History   Marital status: Widowed    Spouse name: Not on file   Number of children: Not on file   Years of education: Not on file   Highest education level: Not on file  Occupational History   Occupation: retired  Tobacco Use   Smoking status: Never   Smokeless tobacco: Never   Tobacco comments:    Do not and never have smoked  Vaping Use   Vaping Use: Never used  Substance and Sexual Activity   Alcohol use: Yes   Drug use: No   Sexual activity: Not Currently    Birth control/protection: Post-menopausal  Other Topics Concern   Not on file  Social History Narrative   Not on file   Social Determinants of Health   Financial Resource Strain: Low Risk  (01/29/2022)   Overall Financial Resource Strain (CARDIA)  Difficulty of Paying Living Expenses: Not hard at all  Food Insecurity: Unknown (01/29/2022)   Hunger Vital Sign    Worried About Running Out of Food in the Last Year: Not on file    Ran Out of Food in the Last Year: Never true  Transportation Needs: No Transportation Needs (01/29/2022)   PRAPARE - Administrator, Civil Service (Medical): No    Lack of Transportation (Non-Medical): No  Physical Activity: Insufficiently Active (01/29/2022)   Exercise Vital Sign    Days of Exercise per Week: 3 days    Minutes of Exercise per Session: 30 min  Stress: No Stress Concern Present (01/29/2022)   [employer] of Occupational Health - Occupational Stress Questionnaire    Feeling of  Stress : Not at all  Social Connections: Moderately Integrated (01/29/2022)   Social Connection and Isolation Panel [NHANES]    Frequency of Communication with Friends and Family: Twice a week    Frequency of Social Gatherings with Friends and Family: Twice a week    Attends Religious Services: 1 to 4 times per year    Active Member of [employer] or Organizations: Yes    Attends [employer] Meetings: 1 to 4 times per year    Marital Status: Widowed   Allergies  Allergen Reactions   Sulfa Antibiotics Nausea Only and Nausea And Vomiting   Brimonidine Tartrate    Brimonidine Tartrate    Family History  Problem Relation Age of Onset   Colon cancer Father    Cancer Father        skin   Colon polyps Sister    Colon polyps Sister    Colon polyps Sister    Colon polyps Sister    Cancer Maternal Grandfather    Cancer Paternal Grandfather        skin   Pancreatic cancer Neg Hx    Stomach cancer Neg Hx    Esophageal cancer Neg Hx    Rectal cancer Neg Hx     Current Outpatient Medications (Endocrine & Metabolic):    levothyroxine (SYNTHROID) 100 MCG tablet, TAKE 1 TABLET BY MOUTH EVERY DAY  Current Outpatient Medications (Cardiovascular):    atorvastatin (LIPITOR) 20 MG tablet, TAKE 1 TABLET BY MOUTH EVERY DAY   furosemide (LASIX) 20 MG tablet, TAKE 1 TABLET BY MOUTH EVERY DAY  Current Outpatient Medications (Respiratory):    fluticasone (FLONASE) 50 MCG/ACT nasal spray, INSTILL 2 SPRAYS IN EACH NOSTRIL ONCE A DAY   promethazine (PHENERGAN) 25 MG tablet, TAKE 1 TABLET BY MOUTH EVERY 6 HOURS AS NEEDED FOR NAUSEA OR VOMITING.   Current Outpatient Medications (Hematological):    cyanocobalamin 1000 MCG tablet, Take 1,000 mcg by mouth daily.  Current Outpatient Medications (Other):    DULoxetine (CYMBALTA) 20 MG capsule, TAKE 1 CAPSULE BY MOUTH EVERY DAY   gabapentin (NEURONTIN) 100 MG capsule, Take 2 capsules (200 mg total) by mouth 3 (three) times daily as needed.   imiquimod  (ALDARA) 5 % cream, Apply topically at bedtime.   Multiple Vitamin (MULTIVITAMIN) tablet, Take 1 tablet by mouth daily.   Probiotic Product (PROBIOTIC-10) CHEW, Chew by mouth.   Reviewed prior external information including notes and imaging from  primary care provider As well as notes that were available from care everywhere and other healthcare systems.  Past medical history, social, surgical and family history all reviewed in electronic medical record.  No pertanent information unless stated regarding to the chief complaint.   Review of Systems:  No headache, visual changes, nausea, vomiting, diarrhea, constipation, dizziness, abdominal pain, skin rash, fevers, chills, night sweats, weight loss, swollen lymph nodes, body aches, joint swelling, chest pain, shortness of breath, mood changes. POSITIVE muscle aches  Objective  Blood pressure 124/86, pulse 89, height 5\' 2"  (1.575 m), weight 191 lb 6.4 oz (86.8 kg), SpO2 97 %.   General: No apparent distress alert and oriented x3 mood and affect normal, dressed appropriately.  HEENT: Pupils equal, extraocular movements intact  Respiratory: Patient's speak in full sentences and does not appear short of breath  Cardiovascular: No lower extremity edema, non tender, no erythema   Antalgic gait noted.  Patient does have crepitus noted of the knees.  Does have to severe tenderness to palpation noted.  Instability with valgus and varus force.  Varus deformity of the knees noted.  After informed written and verbal consent, patient was seated on exam table. Right knee was prepped with alcohol swab and utilizing anterolateral approach, patient's right knee space was injected with 4:1  marcaine 0.5%: Kenalog 40mg /dL. Patient tolerated the procedure well without immediate complications.  After informed written and verbal consent, patient was seated on exam table. Left knee was prepped with alcohol swab and utilizing anterolateral approach, patient's left  knee space was injected with 4:1  marcaine 0.5%: Kenalog 40mg /dL. Patient tolerated the procedure well without immediate complications.   Impression and Recommendations:    The above documentation has been reviewed and is accurate and complete Judi Saa, DO

## 2022-10-26 ENCOUNTER — Encounter: Payer: Self-pay | Admitting: Family Medicine

## 2022-10-26 ENCOUNTER — Ambulatory Visit (INDEPENDENT_AMBULATORY_CARE_PROVIDER_SITE_OTHER): Payer: Medicare Other | Admitting: Family Medicine

## 2022-10-26 VITALS — BP 124/86 | HR 89 | Ht 62.0 in | Wt 191.4 lb

## 2022-10-26 DIAGNOSIS — M170 Bilateral primary osteoarthritis of knee: Secondary | ICD-10-CM | POA: Diagnosis not present

## 2022-10-26 NOTE — Patient Instructions (Signed)
Great to see you  Injected both knees today  Start with Pt and they will call you from [facility]  See me again in 10-12 weeks

## 2022-10-26 NOTE — Assessment & Plan Note (Signed)
Bilateral worsening pain in the knees again.  Would like to try aquatic therapy with this worsening progression.  Discussed with patient icing regimen and home exercises.  Patient's BMI is over 35 so would be a candidate for knee replacement if she ever needs it. Patient will try these interventions and follow-up with me again in 3 months.

## 2022-11-04 DIAGNOSIS — H26491 Other secondary cataract, right eye: Secondary | ICD-10-CM | POA: Diagnosis not present

## 2022-12-06 ENCOUNTER — Other Ambulatory Visit: Payer: Self-pay | Admitting: Family Medicine

## 2022-12-09 ENCOUNTER — Ambulatory Visit: Payer: Medicare Other | Admitting: Family Medicine

## 2022-12-21 ENCOUNTER — Ambulatory Visit: Payer: Medicare Other | Admitting: Family Medicine

## 2022-12-21 VITALS — BP 134/97 | HR 92 | Temp 97.8°F | Ht 62.0 in | Wt 191.0 lb

## 2022-12-21 DIAGNOSIS — R42 Dizziness and giddiness: Secondary | ICD-10-CM | POA: Diagnosis not present

## 2022-12-21 DIAGNOSIS — J0100 Acute maxillary sinusitis, unspecified: Secondary | ICD-10-CM

## 2022-12-21 LAB — POC COVID19 BINAXNOW: SARS Coronavirus 2 Ag: NEGATIVE

## 2022-12-21 MED ORDER — AZITHROMYCIN 250 MG PO TABS: ORAL_TABLET | ORAL | 0 refills | Status: AC

## 2022-12-21 MED ORDER — MECLIZINE HCL 25 MG PO TABS
25.0000 mg | ORAL_TABLET | Freq: Three times a day (TID) | ORAL | 0 refills | Status: DC | PRN
Start: 2022-12-21 — End: 2023-08-10

## 2022-12-21 NOTE — Patient Instructions (Addendum)
-  It was a pleasure to care for you today. -START Meclizine 25mg  tablet, take 1 tablet every 8 hours as needed for dizziness. -START Azithromycin 250mg  tablet, take 2 tablets today and 1 tablet 2nd through 5th day for sinus infection.  -Orthostatics were positive with change in blood pressure changing positions. Recommend to hydrate with water/fluids.  -If symptoms become worse through the night, call 911 or go directly to the emergency department. Please send a MyChart message about how you are feeling tomorrow. Follow up if not improved.

## 2022-12-21 NOTE — Progress Notes (Signed)
Acute Office Visit   Subjective:  Patient ID: Lynn [name], female    DOB: 01-May-1951, 72 y.o.   MRN: [id number]  Chief Complaint  Patient presents with   Dizziness    Pt is here today for sx of Vertigo sx started today Pt reports Fatigue , Headache, nasal drainage 2 days ago. Pt reports she ate today and she vomit it back up    HPI:  Patient is a 72 year old female that present with dizziness that started today. Denies any previous history dizziness. Reports this started this morning when ambulating across a room right after waking up. She reports the dizziness occurs when ambulating, not when changing positions. Denies having dizziness when sitting. Associated symptoms of nausea and vomiting. Also, complains of fatigue, frontal headache, and nasal drainage that started 2 days ago.   Denies chest pain, sore throat, ear pain or drainage, chest pain, cough, SHOB, or fever.  Review of Systems  Neurological:  Positive for dizziness.  See HPI above      Objective:   BP (!) 134/97   Pulse 92   Temp 97.8 F (36.6 C)   Ht 5\' 2"  (1.575 m)   Wt 191 lb (86.6 kg)   SpO2 97%   BMI 34.93 kg/m    Physical Exam Vitals reviewed.  Constitutional:      General: She is not in acute distress.    Appearance: Normal appearance. She is obese. She is not ill-appearing, toxic-appearing or diaphoretic.  HENT:     Head: Normocephalic and atraumatic.     Right Ear: Tympanic membrane, ear canal and external ear normal. There is no impacted cerumen.     Left Ear: Tympanic membrane, ear canal and external ear normal. There is no impacted cerumen.     Nose:     Right Sinus: Maxillary sinus tenderness present. No frontal sinus tenderness.     Left Sinus: Maxillary sinus tenderness present. No frontal sinus tenderness.     Mouth/Throat:     Pharynx: Oropharynx is clear. Uvula midline. No pharyngeal swelling, oropharyngeal exudate, posterior oropharyngeal erythema or uvula swelling.  Eyes:      General:        Right eye: No discharge.        Left eye: No discharge.     Extraocular Movements:     Right eye: Nystagmus present.     Left eye: Nystagmus present.     Conjunctiva/sclera: Conjunctivae normal.     Comments: Nystagmus vertical, not horizontal.   Cardiovascular:     Rate and Rhythm: Normal rate and regular rhythm.     Heart sounds: Normal heart sounds. No murmur heard.    No friction rub. No gallop.  Pulmonary:     Effort: Pulmonary effort is normal. No respiratory distress.     Breath sounds: Normal breath sounds.  Musculoskeletal:        General: Normal range of motion.  Skin:    General: Skin is warm and dry.  Neurological:     General: No focal deficit present.     Mental Status: She is alert and oriented to person, place, and time. Mental status is at baseline.     Cranial Nerves: Cranial nerves 2-12 are intact.     Motor: No weakness.  Psychiatric:        Mood and Affect: Mood normal.        Behavior: Behavior normal.        Thought Content: Thought content  normal.        Judgment: Judgment normal.       Assessment & Plan:  Vertigo -     Orthostatic vital signs -     Meclizine HCl; Take 1 tablet (25 mg total) by mouth 3 (three) times daily as needed for up to 7 days for dizziness.  Dispense: 21 tablet; Refill: 0  Acute maxillary sinusitis, recurrence not specified -     POC COVID-19 BinaxNow -     Azithromycin; Take 2 tablets on day 1, then 1 tablet daily on days 2 through 5  Dispense: 6 tablet; Refill: 0  -Rapid covid is negative.  -Prescribed Meclizine 25mg  tablet, take 1 tablet every 8 hours as needed for dizziness. -Prescribed Azithromycin 250mg  tablet, take 2 tablets today and 1 tablet 2nd through 5th day for sinus infection.  -Orthostatics were positive with change in blood pressure changing positions. Recommend to hydrate with water/fluids.  -If symptoms become worse through the night, call 911 or go directly to the emergency department.  Please send a MyChart message about how you are feeling tomorrow. Follow up if not improved.   Zandra Abts, NP

## 2022-12-25 ENCOUNTER — Ambulatory Visit: Payer: Medicare Other | Admitting: Family Medicine

## 2022-12-25 ENCOUNTER — Telehealth: Payer: Self-pay

## 2022-12-25 ENCOUNTER — Other Ambulatory Visit: Payer: Self-pay | Admitting: Family Medicine

## 2022-12-25 DIAGNOSIS — R42 Dizziness and giddiness: Secondary | ICD-10-CM

## 2022-12-25 NOTE — Progress Notes (Signed)
Sending a referral to ENT and physical therapy for evaluation and treatment of dizziness. Based on previous exam and symptoms that are improving with Meclizine, suspect it is related to inner ear.

## 2022-12-25 NOTE — Telephone Encounter (Signed)
Pt was informed and will call back when /if she does need a refill

## 2022-12-25 NOTE — Telephone Encounter (Signed)
See telephone note.

## 2022-12-28 ENCOUNTER — Ambulatory Visit: Payer: Medicare Other | Admitting: Family Medicine

## 2022-12-29 ENCOUNTER — Ambulatory Visit (INDEPENDENT_AMBULATORY_CARE_PROVIDER_SITE_OTHER): Payer: Medicare Other | Admitting: Family Medicine

## 2022-12-29 ENCOUNTER — Encounter: Payer: Self-pay | Admitting: Family Medicine

## 2022-12-29 VITALS — BP 122/80 | HR 88 | Temp 98.6°F | Wt 188.1 lb

## 2022-12-29 DIAGNOSIS — L989 Disorder of the skin and subcutaneous tissue, unspecified: Secondary | ICD-10-CM | POA: Diagnosis not present

## 2022-12-29 DIAGNOSIS — R519 Headache, unspecified: Secondary | ICD-10-CM | POA: Diagnosis not present

## 2022-12-29 DIAGNOSIS — R42 Dizziness and giddiness: Secondary | ICD-10-CM | POA: Diagnosis not present

## 2022-12-29 MED ORDER — METHYLPREDNISOLONE ACETATE 80 MG/ML IJ SUSP
80.0000 mg | Freq: Once | INTRAMUSCULAR | Status: AC
Start: 2022-12-29 — End: 2022-12-29
  Administered 2022-12-29: 80 mg via INTRAMUSCULAR

## 2022-12-29 NOTE — Patient Instructions (Signed)
-  Administered Depo-Medrol 80mg  injection during appointment for headache and dizziness. -Recommend to follow up with physical therapy tomorrow and ENT.  -Can continue taking Meclizine if needed for dizziness.

## 2022-12-29 NOTE — Progress Notes (Signed)
Acute Office Visit   Subjective:  Patient ID: Lynn [name], female    DOB: Nov 27, 1950, 72 y.o.   MRN: [id number]  Chief Complaint  Patient presents with   Headache   Facial Pain    Pt is here today with C/O of sinus pain and headache. Pt was seen for this 12/21/2022 Pt has ENT appt 01/21/2023    HPI:  Patient is a 72 year old that complains of sinus pain and frontal headache.  Patient reports she took Mucinex and Tylenol and seemed to help.  Denies sore throat, fever, chest pain, or shortness of breath.  Reports constant tinnitus, and reports right ear pain when sleeping on that side.  Patient was seen 12/21/2022 with this same provider for dizziness. Diagnosed with vertigo and acute maxillary sinusitis. Prescribed Azithromycin for 5 days and Meclizine. She reports she has completed taking Azithromycin and has continued to take Meclizine intermittently. She has not took medication today because it causes drowsiness. She reports the dizziness has stayed about the same.   She is scheduled tomorrow for physical therapy about dizziness and has an ENT visit on 01/21/2023.   She reports she previously had the same symptoms before when she worked with [employer]. She reports she was prescribed a steroid injection that helped. She is requesting another injection to see if this helps.   As patient was walking out of her visit, she requested a referral to dermatology due to needing a skin assessment for history of abnormalities on her face. She reports her previous dermatology, Dr. Janalyn [name], is retiring.   Review of Systems  Neurological:  Positive for headaches.  See HPI above     Objective:   BP 122/80   Pulse 88   Temp 98.6 F (37 C)   Wt 188 lb 2 oz (85.3 kg)   SpO2 97%   BMI 34.41 kg/m    Physical Exam Vitals reviewed.  Constitutional:      General: She is not in acute distress.    Appearance: Normal appearance. She is well-developed. She is not ill-appearing,  toxic-appearing or diaphoretic.  HENT:     Head: Normocephalic and atraumatic.     Right Ear: Tympanic membrane, ear canal and external ear normal. There is no impacted cerumen.     Left Ear: Tympanic membrane, ear canal and external ear normal. There is no impacted cerumen.     Nose:     Right Sinus: Maxillary sinus tenderness present. No frontal sinus tenderness.     Left Sinus: Maxillary sinus tenderness present. No frontal sinus tenderness.     Mouth/Throat:     Pharynx: Oropharynx is clear. Uvula midline. No pharyngeal swelling, oropharyngeal exudate, posterior oropharyngeal erythema, uvula swelling or postnasal drip.  Eyes:     General:        Right eye: No discharge.        Left eye: No discharge.     Conjunctiva/sclera: Conjunctivae normal.     Pupils: Pupils are equal, round, and reactive to light.  Cardiovascular:     Rate and Rhythm: Normal rate and regular rhythm.     Heart sounds: Normal heart sounds. No murmur heard.    No friction rub. No gallop.  Pulmonary:     Effort: Pulmonary effort is normal. No respiratory distress.     Breath sounds: Normal breath sounds.  Musculoskeletal:        General: Normal range of motion.  Skin:    General: Skin is warm  and dry.  Neurological:     General: No focal deficit present.     Mental Status: She is alert and oriented to person, place, and time. Mental status is at baseline.  Psychiatric:        Mood and Affect: Mood normal.        Speech: Speech normal.        Behavior: Behavior normal.        Thought Content: Thought content normal.        Judgment: Judgment normal.       Assessment & Plan:  Dizziness -     methylPREDNISolone Acetate  Acute nonintractable headache, unspecified headache type -     methylPREDNISolone Acetate  Skin lesion of face -     Ambulatory referral to Dermatology  -Administered Depo-Medrol 80mg  injection during appointment for headache and dizziness as patient requested to see if this will  help with her symptoms as it has previously.  -Recommend to follow up with physical therapy tomorrow and ENT in August.   -Can continue taking Meclizine if needed for dizziness.  -Placed a referral to dermatology for history of lesions on face and needs a skin assessment.   Zandra Abts, NP

## 2022-12-30 ENCOUNTER — Encounter: Payer: Medicare Other | Admitting: Physical Therapy

## 2023-01-04 ENCOUNTER — Other Ambulatory Visit: Payer: Self-pay

## 2023-01-04 ENCOUNTER — Ambulatory Visit (INDEPENDENT_AMBULATORY_CARE_PROVIDER_SITE_OTHER): Payer: Medicare Other | Admitting: Physical Therapy

## 2023-01-04 ENCOUNTER — Encounter: Payer: Self-pay | Admitting: Physical Therapy

## 2023-01-04 ENCOUNTER — Telehealth: Payer: Self-pay | Admitting: Family Medicine

## 2023-01-04 ENCOUNTER — Ambulatory Visit: Payer: Medicare Other | Admitting: Family Medicine

## 2023-01-04 DIAGNOSIS — R2681 Unsteadiness on feet: Secondary | ICD-10-CM | POA: Diagnosis not present

## 2023-01-04 DIAGNOSIS — R42 Dizziness and giddiness: Secondary | ICD-10-CM | POA: Diagnosis not present

## 2023-01-04 MED ORDER — FLUTICASONE PROPIONATE 50 MCG/ACT NA SUSP: 2.0000 | Freq: Every day | NASAL | 1 refills | Status: DC

## 2023-01-04 NOTE — Telephone Encounter (Signed)
I have sent refill in to the pharmacy

## 2023-01-04 NOTE — Telephone Encounter (Signed)
Encourage patient to contact the pharmacy for refills or they can request refills through [facility]  WHAT PHARMACY WOULD THEY LIKE THIS SENT TO:  [id number] - [place], [place] - [address]   MEDICATION NAME & DOSE: Didn't work   fluticasone (FLONASE) 50 MCG/ACT nasal spray  NOTES/COMMENTS FROM PATIENT:     Front office please notify patient: It takes 48-72 hours to process rx refill requests Ask patient to call pharmacy to ensure rx is ready before heading there.

## 2023-01-04 NOTE — Therapy (Signed)
OUTPATIENT PHYSICAL THERAPY VESTIBULAR EVALUATION     Patient Name: Valley Giove MRN: [id number] DOB:Jan 08, 1951, 72 y.o., female Today's Date: 01/04/2023  END OF SESSION:  PT End of Session - 01/04/23 1008     Visit Number 1    Number of Visits 12    Date for PT Re-Evaluation 02/15/23    Authorization Type Medicare    Progress Note Due on Visit 10    PT Start Time 0930    PT Stop Time 1008    PT Time Calculation (min) 38 min    Behavior During Therapy [facility] for tasks assessed/performed             Past Medical History:  Diagnosis Date   Allergy    Arthritis    left knee   Basal cell carcinoma of skin 10/14/1994   Right breast   Basal cell carcinoma of skin 12/07/2011   Left inner elbow - TX p BX   Hyperlipidemia    Hypertension    Hypothyroidism    Squamous cell carcinoma of skin 12/07/2011   Left lower leg - TX p BX   Squamous cell carcinoma of skin 02/02/2017   Left anterior thigh - CX3 + 5FU   Past Surgical History:  Procedure Laterality Date   ABDOMINAL HYSTERECTOMY     c-section 1983     catract  Right 01/15/2022   CESAREAN SECTION  06/27/1981   COLONOSCOPY     EYE SURGERY  01/15/22   Cataract surgery right eye   KNEE ARTHROSCOPY     PARTIAL HYSTERECTOMY     POLYPECTOMY     TONSILLECTOMY     TUBAL LIGATION     Patient Active Problem List   Diagnosis Date Noted   Degenerative disc disease, lumbar 08/24/2022   Combined forms of age-related cataract of right eye 01/14/2022   Regular astigmatism of right eye 01/14/2022   Lateral subluxation of right patella 08/15/2019   Pain of right hip joint 05/21/2018   Degenerative arthritis of knee, bilateral 10/18/2017   Elevated BP without diagnosis of hypertension 11/05/2016   Physical exam 04/22/2016   Hypothyroidism 10/24/2015   Hyperlipidemia 10/24/2015   Obesity (BMI 35.0-39.9 without comorbidity) 10/24/2015    PCP: Sheliah Hatch, MD  REFERRING PROVIDER: Alveria Apley,  NP  REFERRING DIAG: R42 (ICD-10-CM) - Vertigo  THERAPY DIAG:  Dizziness and giddiness - Plan: PT plan of care cert/re-cert  Unsteadiness on feet - Plan: PT plan of care cert/re-cert  ONSET DATE: 12/21/22  Rationale for Evaluation and Treatment: Rehabilitation  SUBJECTIVE:   SUBJECTIVE STATEMENT: Patient is a 72 year old female that present with dizziness that started 12/21/22.  Reports this started this morning when ambulating across a room right after waking up. She reports the dizziness occurs when ambulating, not when changing positions. Denies having dizziness when sitting. Associated symptoms of nausea and vomiting. Symptoms have improved but still having some residual symptoms.  Pt accompanied by: self  PERTINENT HISTORY: HLD, HTN, OA  PAIN:  Are you having pain?  C/o sinus pressure but no pain  PRECAUTIONS: None  RED FLAGS: None   WEIGHT BEARING RESTRICTIONS: No  FALLS: Has patient fallen in last 6 months? No  LIVING ENVIRONMENT: Lives with: lives alone Lives in: House/apartment Stairs: Yes: Internal: 15 steps; on left going up and External: 6 steps; can reach both (inside steps to basement)  PLOF: Independent and Leisure: travel  PATIENT GOALS: improve dizziness  OBJECTIVE:   DIAGNOSTIC FINDINGS: n/a  COGNITION: Overall cognitive status: Within functional limits for tasks assessed  POSTURE:  rounded shoulders and forward head  GAIT: Comments: independent with slightly guarded posture  VESTIBULAR ASSESSMENT:  GENERAL OBSERVATION: no symptoms (except sinus headache)   SYMPTOM BEHAVIOR:  Subjective history: see above  Non-Vestibular symptoms: headaches, tinnitus, and nausea/vomiting  Type of dizziness: Imbalance (Disequilibrium), Unsteady with head/body turns, and "Swimmyheaded"  Frequency: nearly every time she gets up in the morning  Duration: about a min  Aggravating factors: Induced by position change: supine to sit and Worse in the  morning  Relieving factors: rest and sitting still  Progression of symptoms: better  OCULOMOTOR EXAM:  Ocular Alignment: normal  Ocular ROM: No Limitations  Spontaneous Nystagmus: absent  Gaze-Induced Nystagmus: absent  Smooth Pursuits: intact  Saccades: intact and symptom provoking bil   VESTIBULAR - OCULAR REFLEX:   VOR: intact with increase in symptoms  Head-Impulse Test: HIT Right: positive with catch up saccade HIT Left: negative    POSITIONAL TESTING: Right Dix-Hallpike: no nystagmus Left Dix-Hallpike: no nystagmus Right Roll Test: no nystagmus Left Roll Test: no nystagmus and mild symptoms when rolling; resolved quickly  MOTION SENSITIVITY:  Motion Sensitivity Quotient Intensity: 0 = none, 1 = Lightheaded, 2 = Mild, 3 = Moderate, 4 = Severe, 5 = Vomiting  Intensity  1. Sitting to supine   2. Supine to L side   3. Supine to R side   4. Supine to sitting   5. L Hallpike-Dix   6. Up from L    7. R Hallpike-Dix   8. Up from R    9. Sitting, head tipped to L knee   10. Head up from L knee   11. Sitting, head tipped to R knee   12. Head up from R knee   13. Sitting head turns x5   14.Sitting head nods x5   15. In stance, 180 turn to L    16. In stance, 180 turn to R     OTHOSTATICS: Supine: 127/83 with HR 92 Standing: 132/89 with HR 80   VESTIBULAR TREATMENT:                                                                                                   DATE:  01/04/23 Gaze Adaptation: x1 Viewing Horizontal: Position: sitting, Time: 30 sec, Reps: 1, and Comment: demonstrated for HEP and x1 Viewing Vertical:  Position: sitting, Time: 30 sec, Reps: 1, and Comment: demonstrated for HEP  PATIENT EDUCATION: Education details: HEP Person educated: Patient Education method: Programmer, multimedia, Facilities manager, and Handouts Education comprehension: verbalized understanding, returned demonstration, and needs further education  HOME EXERCISE PROGRAM: Access Code:  [email or web address] URL: [email or web address] Date: 01/04/2023 Prepared by: Moshe Cipro  Exercises - Seated Gaze Stabilization with Head Rotation  - 2 x daily - 7 x weekly - 1 sets - 30 seconds - Seated Gaze Stabilization with Head Nod  - 2 x daily - 7 x weekly - 1 sets - 30 seconds  Patient Education - Gaze Stabilization  GOALS: Goals reviewed with patient? Yes  SHORT TERM GOALS: Target date:  01/25/2023   Independent with initial HEP Goal status: INITIAL  2.  Report 50% improvement in vestibular symptoms Goal status: INITIAL  LONG TERM GOALS: Target date: 02/15/2023  Independent with final HEP Goal status: INITIAL  2.  Report 50% improvement in vestibular symptoms Goal status: INITIAL  3.  Amb without any deviations or increase in symptoms for improved mobility Goal status: INIITAL  ASSESSMENT:  CLINICAL IMPRESSION: Patient is a 72 y.o. female who was seen today for physical therapy evaluation and treatment for vertigo consistent for likely vestibular hypofunction. She demonstrates mild gait abnormalities, continued vertigo and mild balance deficits affecting functional mobility.  She will benefit from PT to address deficits listed.    OBJECTIVE IMPAIRMENTS: Abnormal gait, decreased activity tolerance, decreased balance, and dizziness.   ACTIVITY LIMITATIONS: bending, standing, bed mobility, and locomotion level  PARTICIPATION LIMITATIONS: meal prep, cleaning, laundry, driving, shopping, and community activity  PERSONAL FACTORS: 3+ comorbidities: HLD, HTN, OA  are also affecting patient's functional outcome.   REHAB POTENTIAL: Good  CLINICAL DECISION MAKING: Evolving/moderate complexity  EVALUATION COMPLEXITY: Moderate   PLAN:  PT FREQUENCY: 1-2x/week  PT DURATION: 6 weeks  PLANNED INTERVENTIONS: Therapeutic exercises, Therapeutic activity, Neuromuscular re-education, Balance training, Gait training, Patient/Family education, Self Care, Vestibular  training, Canalith repositioning, Visual/preceptual remediation/compensation, Aquatic Therapy, Dry Needling, Electrical stimulation, Cryotherapy, Moist heat, Taping, Manual therapy, and Re-evaluation  PLAN FOR NEXT SESSION: review HEP, possible formal balance testing, dynamic vestibular tasks; reassess PRN   Clarita Crane, PT, DPT 01/04/23 12:07 PM

## 2023-01-06 ENCOUNTER — Encounter: Payer: Self-pay | Admitting: Physical Therapy

## 2023-01-06 ENCOUNTER — Ambulatory Visit (INDEPENDENT_AMBULATORY_CARE_PROVIDER_SITE_OTHER): Payer: Medicare Other | Admitting: Physical Therapy

## 2023-01-06 DIAGNOSIS — R42 Dizziness and giddiness: Secondary | ICD-10-CM

## 2023-01-06 DIAGNOSIS — R2681 Unsteadiness on feet: Secondary | ICD-10-CM

## 2023-01-06 NOTE — Therapy (Signed)
OUTPATIENT PHYSICAL THERAPY VESTIBULAR EVALUATION     Patient Name: Lynn [name] MRN: [id number] DOB:04-28-1951, 72 y.o., female Today's Date: 01/06/2023  END OF SESSION:  PT End of Session - 01/06/23 0846     Visit Number 2    Number of Visits 12    Date for PT Re-Evaluation 02/15/23    Authorization Type Medicare    Progress Note Due on Visit 10    PT Start Time 0847    PT Stop Time 0913    PT Time Calculation (min) 26 min    Activity Tolerance Patient tolerated treatment well    Behavior During Therapy [facility] for tasks assessed/performed              Past Medical History:  Diagnosis Date   Allergy    Arthritis    left knee   Basal cell carcinoma of skin 10/14/1994   Right breast   Basal cell carcinoma of skin 12/07/2011   Left inner elbow - TX p BX   Hyperlipidemia    Hypertension    Hypothyroidism    Squamous cell carcinoma of skin 12/07/2011   Left lower leg - TX p BX   Squamous cell carcinoma of skin 02/02/2017   Left anterior thigh - CX3 + 5FU   Past Surgical History:  Procedure Laterality Date   ABDOMINAL HYSTERECTOMY     c-section 1983     catract  Right 01/15/2022   CESAREAN SECTION  06/27/1981   COLONOSCOPY     EYE SURGERY  01/15/22   Cataract surgery right eye   KNEE ARTHROSCOPY     PARTIAL HYSTERECTOMY     POLYPECTOMY     TONSILLECTOMY     TUBAL LIGATION     Patient Active Problem List   Diagnosis Date Noted   Degenerative disc disease, lumbar 08/24/2022   Combined forms of age-related cataract of right eye 01/14/2022   Regular astigmatism of right eye 01/14/2022   Lateral subluxation of right patella 08/15/2019   Pain of right hip joint 05/21/2018   Degenerative arthritis of knee, bilateral 10/18/2017   Elevated BP without diagnosis of hypertension 11/05/2016   Physical exam 04/22/2016   Hypothyroidism 10/24/2015   Hyperlipidemia 10/24/2015   Obesity (BMI 35.0-39.9 without comorbidity) 10/24/2015    PCP: Sheliah Hatch,  MD  REFERRING PROVIDER: Sheliah Hatch, MD  REFERRING DIAG: R42 (ICD-10-CM) - Vertigo  THERAPY DIAG:  Dizziness and giddiness  Unsteadiness on feet  ONSET DATE: 12/21/22  Rationale for Evaluation and Treatment: Rehabilitation  SUBJECTIVE:   SUBJECTIVE STATEMENT: Still feels off balance especially with quick movements and getting up in the morning.  Does feel better today than the other day.  Pt accompanied by: self  PERTINENT HISTORY: HLD, HTN, OA  PAIN:  Are you having pain?  C/o sinus pressure but no pain  PRECAUTIONS: None  RED FLAGS: None   WEIGHT BEARING RESTRICTIONS: No  FALLS: Has patient fallen in last 6 months? No  LIVING ENVIRONMENT: Lives with: lives alone Lives in: House/apartment Stairs: Yes: Internal: 15 steps; on left going up and External: 6 steps; can reach both (inside steps to basement)  PLOF: Independent and Leisure: travel  PATIENT GOALS: improve dizziness  OBJECTIVE:   DIAGNOSTIC FINDINGS: n/a  COGNITION: Overall cognitive status: Within functional limits for tasks assessed  POSTURE:  rounded shoulders and forward head  GAIT: Comments: independent with slightly guarded posture  VESTIBULAR ASSESSMENT:  GENERAL OBSERVATION: no symptoms (except sinus headache)   SYMPTOM  BEHAVIOR:  Subjective history: see above  Non-Vestibular symptoms: headaches, tinnitus, and nausea/vomiting  Type of dizziness: Imbalance (Disequilibrium), Unsteady with head/body turns, and "Swimmyheaded"  Frequency: nearly every time she gets up in the morning  Duration: about a min  Aggravating factors: Induced by position change: supine to sit and Worse in the morning  Relieving factors: rest and sitting still  Progression of symptoms: better  OCULOMOTOR EXAM:  Ocular Alignment: normal  Ocular ROM: No Limitations  Spontaneous Nystagmus: absent  Gaze-Induced Nystagmus: absent  Smooth Pursuits: intact  Saccades: intact and symptom provoking  bil   VESTIBULAR - OCULAR REFLEX:   VOR: intact with increase in symptoms  Head-Impulse Test: HIT Right: positive with catch up saccade HIT Left: negative    POSITIONAL TESTING: Right Dix-Hallpike: no nystagmus Left Dix-Hallpike: no nystagmus Right Roll Test: no nystagmus Left Roll Test: no nystagmus and mild symptoms when rolling; resolved quickly  MOTION SENSITIVITY:  Motion Sensitivity Quotient Intensity: 0 = none, 1 = Lightheaded, 2 = Mild, 3 = Moderate, 4 = Severe, 5 = Vomiting  Intensity  1. Sitting to supine   2. Supine to L side   3. Supine to R side   4. Supine to sitting   5. L Hallpike-Dix   6. Up from L    7. R Hallpike-Dix   8. Up from R    9. Sitting, head tipped to L knee   10. Head up from L knee   11. Sitting, head tipped to R knee   12. Head up from R knee   13. Sitting head turns x5   14.Sitting head nods x5   15. In stance, 180 turn to L    16. In stance, 180 turn to R     OTHOSTATICS: Supine: 127/83 with HR 92 Standing: 132/89 with HR 80   VESTIBULAR TREATMENT:                                                                                                   DATE:  01/06/23 Neuro Red-Ed Habituation with rolling and position changes: Supine to roll to Lt then up to sitting x 3 reps; mild symptoms Corner balance activities on foam: EO with horizontal/vertical head turns x 10 each;  EC with horizontal/vertical head turns x 10 each EC with feet apart 2x10 sec Gaze x 1 standing with feet apart and together x 30 sec horizontal and vertical  01/04/23 Gaze Adaptation: x1 Viewing Horizontal: Position: sitting, Time: 30 sec, Reps: 1, and Comment: demonstrated for HEP and x1 Viewing Vertical:  Position: sitting, Time: 30 sec, Reps: 1, and Comment: demonstrated for HEP  PATIENT EDUCATION: Education details: HEP Person educated: Patient Education method: Programmer, multimedia, Facilities manager, and Handouts Education comprehension: verbalized understanding, returned  demonstration, and needs further education  HOME EXERCISE PROGRAM: Access Code: [email or web address] URL: [email or web address] Date: 01/06/2023 Prepared by: Moshe Cipro  Exercises - Seated Gaze Stabilization with Head Rotation  - 2 x daily - 7 x weekly - 1 sets - 30 seconds - Seated Gaze Stabilization with Head Nod  - 2 x daily - 7  x weekly - 1 sets - 30 seconds (Progressed above to feet together) - Sit-to-Supine and Supine-to-Sit: Repetition Exercise  - 1 x daily - 7 x weekly - 1 sets - 5-10 reps - Narrow Stance with Eyes Closed and Head Turns  - 2 x daily - 7 x weekly - 3 sets - 1 reps - 10 rotations each way - Romberg Stance Eyes Closed on Foam Pad  - 2 x daily - 7 x weekly - 1 sets - 5 reps - 10-15 sec hold  Patient Education - Gaze Stabilization  GOALS: Goals reviewed with patient? Yes  SHORT TERM GOALS: Target date: 01/25/2023   Independent with initial HEP Goal status: INITIAL  2.  Report 50% improvement in vestibular symptoms Goal status: INITIAL  LONG TERM GOALS: Target date: 02/15/2023  Independent with final HEP Goal status: INITIAL  2.  Report 50% improvement in vestibular symptoms Goal status: INITIAL  3.  Amb without any deviations or increase in symptoms for improved mobility Goal status: INIITAL  ASSESSMENT:  CLINICAL IMPRESSION: Pt reporting improvement in symptoms overall at this time and able to progress HEP today.  Did add habituation for rolling/supine to sit as this still seems to cause some imbalance.  Will continue to benefit from PT to maximize function.  OBJECTIVE IMPAIRMENTS: Abnormal gait, decreased activity tolerance, decreased balance, and dizziness.   ACTIVITY LIMITATIONS: bending, standing, bed mobility, and locomotion level  PARTICIPATION LIMITATIONS: meal prep, cleaning, laundry, driving, shopping, and community activity  PERSONAL FACTORS: 3+ comorbidities: HLD, HTN, OA  are also affecting patient's functional outcome.    REHAB POTENTIAL: Good  CLINICAL DECISION MAKING: Evolving/moderate complexity  EVALUATION COMPLEXITY: Moderate   PLAN:  PT FREQUENCY: 1-2x/week  PT DURATION: 6 weeks  PLANNED INTERVENTIONS: Therapeutic exercises, Therapeutic activity, Neuromuscular re-education, Balance training, Gait training, Patient/Family education, Self Care, Vestibular training, Canalith repositioning, Visual/preceptual remediation/compensation, Aquatic Therapy, Dry Needling, Electrical stimulation, Cryotherapy, Moist heat, Taping, Manual therapy, and Re-evaluation  PLAN FOR NEXT SESSION: continue habituation for provoking movements PRN,  compliant surface activities, dynamic vestibular tasks; reassess PRN   Clarita Crane, PT, DPT 01/06/23 9:35 AM

## 2023-01-11 ENCOUNTER — Encounter: Payer: Self-pay | Admitting: Physical Therapy

## 2023-01-11 ENCOUNTER — Ambulatory Visit (INDEPENDENT_AMBULATORY_CARE_PROVIDER_SITE_OTHER): Payer: Medicare Other | Admitting: Physical Therapy

## 2023-01-11 DIAGNOSIS — R2681 Unsteadiness on feet: Secondary | ICD-10-CM | POA: Diagnosis not present

## 2023-01-11 DIAGNOSIS — R42 Dizziness and giddiness: Secondary | ICD-10-CM | POA: Diagnosis not present

## 2023-01-11 NOTE — Therapy (Signed)
OUTPATIENT PHYSICAL THERAPY VESTIBULAR TREATMENT     Patient Name: Anyka Stika MRN: [id number] DOB:[birth year], 72 y.o., female Today's Date: 01/11/2023  END OF SESSION:  PT End of Session - 01/11/23 1127     Visit Number 3    Number of Visits 12    Date for PT Re-Evaluation 02/15/23    Authorization Type Medicare    Progress Note Due on Visit 10    PT Start Time 1103    PT Stop Time 1142    PT Time Calculation (min) 39 min    Activity Tolerance Patient tolerated treatment well    Behavior During Therapy [facility] for tasks assessed/performed               Past Medical History:  Diagnosis Date   Allergy    Arthritis    left knee   Basal cell carcinoma of skin 10/14/1994   Right breast   Basal cell carcinoma of skin 12/07/2011   Left inner elbow - TX p BX   Hyperlipidemia    Hypertension    Hypothyroidism    Squamous cell carcinoma of skin 12/07/2011   Left lower leg - TX p BX   Squamous cell carcinoma of skin 02/02/2017   Left anterior thigh - CX3 + 5FU   Past Surgical History:  Procedure Laterality Date   ABDOMINAL HYSTERECTOMY     c-section 1983     catract  Right 01/15/2022   CESAREAN SECTION  06/27/1981   COLONOSCOPY     EYE SURGERY  01/15/22   Cataract surgery right eye   KNEE ARTHROSCOPY     PARTIAL HYSTERECTOMY     POLYPECTOMY     TONSILLECTOMY     TUBAL LIGATION     Patient Active Problem List   Diagnosis Date Noted   Degenerative disc disease, lumbar 08/24/2022   Combined forms of age-related cataract of right eye 01/14/2022   Regular astigmatism of right eye 01/14/2022   Lateral subluxation of right patella 08/15/2019   Pain of right hip joint 05/21/2018   Degenerative arthritis of knee, bilateral 10/18/2017   Elevated BP without diagnosis of hypertension 11/05/2016   Physical exam 04/22/2016   Hypothyroidism 10/24/2015   Hyperlipidemia 10/24/2015   Obesity (BMI 35.0-39.9 without comorbidity) 10/24/2015    PCP: Sheliah Hatch,  MD  REFERRING PROVIDER: Sheliah Hatch, MD  REFERRING DIAG: R42 (ICD-10-CM) - Vertigo  THERAPY DIAG:  Dizziness and giddiness  Unsteadiness on feet  ONSET DATE: 12/21/22  Rationale for Evaluation and Treatment: Rehabilitation  SUBJECTIVE:   SUBJECTIVE STATEMENT:  I'm feeling sleepy today. Dizziness started when I had a sinus infection a few weeks ago. Its been getting better, its just gradually getting not as bad. I have trouble with my knees, seeing the doctor, but standing on cushy thing bothered my knees last time.   Pt accompanied by: self  PERTINENT HISTORY: HLD, HTN, OA  PAIN:  Are you having pain?  0/10 pain small amount of sinus pressure; no dizziness   PRECAUTIONS: None  RED FLAGS: None   WEIGHT BEARING RESTRICTIONS: No  FALLS: Has patient fallen in last 6 months? No  LIVING ENVIRONMENT: Lives with: lives alone Lives in: House/apartment Stairs: Yes: Internal: 15 steps; on left going up and External: 6 steps; can reach both (inside steps to basement)  PLOF: Independent and Leisure: travel  PATIENT GOALS: improve dizziness  OBJECTIVE:   DIAGNOSTIC FINDINGS: n/a  COGNITION: Overall cognitive status: Within functional limits for tasks assessed  POSTURE:  rounded shoulders and forward head  GAIT: Comments: independent with slightly guarded posture  VESTIBULAR ASSESSMENT:  GENERAL OBSERVATION: no symptoms (except sinus headache)   SYMPTOM BEHAVIOR:  Subjective history: see above  Non-Vestibular symptoms: headaches, tinnitus, and nausea/vomiting  Type of dizziness: Imbalance (Disequilibrium), Unsteady with head/body turns, and "Swimmyheaded"  Frequency: nearly every time she gets up in the morning  Duration: about a min  Aggravating factors: Induced by position change: supine to sit and Worse in the morning  Relieving factors: rest and sitting still  Progression of symptoms: better  OCULOMOTOR EXAM:  Ocular Alignment: normal  Ocular  ROM: No Limitations  Spontaneous Nystagmus: absent  Gaze-Induced Nystagmus: absent  Smooth Pursuits: intact  Saccades: intact and symptom provoking bil   VESTIBULAR - OCULAR REFLEX:   VOR: intact with increase in symptoms  Head-Impulse Test: HIT Right: positive with catch up saccade HIT Left: negative    POSITIONAL TESTING: Right Dix-Hallpike: no nystagmus Left Dix-Hallpike: no nystagmus Right Roll Test: no nystagmus Left Roll Test: no nystagmus and mild symptoms when rolling; resolved quickly  MOTION SENSITIVITY:  Motion Sensitivity Quotient Intensity: 0 = none, 1 = Lightheaded, 2 = Mild, 3 = Moderate, 4 = Severe, 5 = Vomiting  Intensity  1. Sitting to supine   2. Supine to L side   3. Supine to R side   4. Supine to sitting   5. L Hallpike-Dix   6. Up from L    7. R Hallpike-Dix   8. Up from R    9. Sitting, head tipped to L knee   10. Head up from L knee   11. Sitting, head tipped to R knee   12. Head up from R knee   13. Sitting head turns x5   14.Sitting head nods x5   15. In stance, 180 turn to L    16. In stance, 180 turn to R     OTHOSTATICS: Supine: 127/83 with HR 92 Standing: 132/89 with HR 80   VESTIBULAR TREATMENT:                                                                                                   DATE:    01/11/23  NMR  EO with horizontal and vertical head turns 2x60 seconds each (second round on foam) Forward/backward walking x5 in room with horizontal head turns, vertical head turns STS x10 with horizontal head turns, then x10 with vertical head turns  Tandem stance solid surface 3x30 seconds B  Forward tandem gait x5 laps cues for appropriate pace + backwards walking  Narrow BOS on blue foam pad x30 seconds EO, x60 EC   01/06/23 Neuro Red-Ed Habituation with rolling and position changes: Supine to roll to Lt then up to sitting x 3 reps; mild symptoms Corner balance activities on foam: EO with horizontal/vertical head turns x  10 each;  EC with horizontal/vertical head turns x 10 each EC with feet apart 2x10 sec Gaze x 1 standing with feet apart and together x 30 sec horizontal and vertical  01/04/23 Gaze Adaptation: x1 Viewing  Horizontal: Position: sitting, Time: 30 sec, Reps: 1, and Comment: demonstrated for HEP and x1 Viewing Vertical:  Position: sitting, Time: 30 sec, Reps: 1, and Comment: demonstrated for HEP  PATIENT EDUCATION: Education details: HEP Person educated: Patient Education method: Programmer, multimedia, Facilities manager, and Handouts Education comprehension: verbalized understanding, returned demonstration, and needs further education  HOME EXERCISE PROGRAM: Access Code: [email or web address] URL: [email or web address] Date: 01/06/2023 Prepared by: Moshe Cipro  Exercises - Seated Gaze Stabilization with Head Rotation  - 2 x daily - 7 x weekly - 1 sets - 30 seconds - Seated Gaze Stabilization with Head Nod  - 2 x daily - 7 x weekly - 1 sets - 30 seconds (Progressed above to feet together) - Sit-to-Supine and Supine-to-Sit: Repetition Exercise  - 1 x daily - 7 x weekly - 1 sets - 5-10 reps - Narrow Stance with Eyes Closed and Head Turns  - 2 x daily - 7 x weekly - 3 sets - 1 reps - 10 rotations each way - Romberg Stance Eyes Closed on Foam Pad  - 2 x daily - 7 x weekly - 1 sets - 5 reps - 10-15 sec hold  Patient Education - Gaze Stabilization  GOALS: Goals reviewed with patient? Yes  SHORT TERM GOALS: Target date: 01/25/2023   Independent with initial HEP Goal status: INITIAL  2.  Report 50% improvement in vestibular symptoms Goal status: INITIAL  LONG TERM GOALS: Target date: 02/15/2023  Independent with final HEP Goal status: INITIAL  2.  Report 50% improvement in vestibular symptoms Goal status: INITIAL  3.  Amb without any deviations or increase in symptoms for improved mobility Goal status: INIITAL  ASSESSMENT:  CLINICAL IMPRESSION:  Madisson arrives today doing well,  sounds like her symptoms are continuing to slowly improve. Progressed habituation activities as tolerated today with breaks PRN. Also increased focus on balance work today. Doing well, has one more visit scheduled will consider possible hold vs continuing a little longer at that point.   OBJECTIVE IMPAIRMENTS: Abnormal gait, decreased activity tolerance, decreased balance, and dizziness.   ACTIVITY LIMITATIONS: bending, standing, bed mobility, and locomotion level  PARTICIPATION LIMITATIONS: meal prep, cleaning, laundry, driving, shopping, and community activity  PERSONAL FACTORS: 3+ comorbidities: HLD, HTN, OA  are also affecting patient's functional outcome.   REHAB POTENTIAL: Good  CLINICAL DECISION MAKING: Evolving/moderate complexity  EVALUATION COMPLEXITY: Moderate   PLAN:  PT FREQUENCY: 1-2x/week  PT DURATION: 6 weeks  PLANNED INTERVENTIONS: Therapeutic exercises, Therapeutic activity, Neuromuscular re-education, Balance training, Gait training, Patient/Family education, Self Care, Vestibular training, Canalith repositioning, Visual/preceptual remediation/compensation, Aquatic Therapy, Dry Needling, Electrical stimulation, Cryotherapy, Moist heat, Taping, Manual therapy, and Re-evaluation  PLAN FOR NEXT SESSION: continue habituation for provoking movements PRN,  compliant surface activities, dynamic vestibular tasks; reassess PRN   Nedra Hai, PT, DPT 01/11/23 11:46 AM

## 2023-01-20 ENCOUNTER — Encounter: Payer: Medicare Other | Admitting: Physical Therapy

## 2023-01-21 ENCOUNTER — Encounter (INDEPENDENT_AMBULATORY_CARE_PROVIDER_SITE_OTHER): Payer: Self-pay | Admitting: Otolaryngology

## 2023-01-21 ENCOUNTER — Ambulatory Visit (INDEPENDENT_AMBULATORY_CARE_PROVIDER_SITE_OTHER): Payer: Medicare Other | Admitting: Otolaryngology

## 2023-01-21 VITALS — BP 168/92 | HR 58 | Ht 62.0 in | Wt 188.0 lb

## 2023-01-21 DIAGNOSIS — J3089 Other allergic rhinitis: Secondary | ICD-10-CM | POA: Diagnosis not present

## 2023-01-21 DIAGNOSIS — H8113 Benign paroxysmal vertigo, bilateral: Secondary | ICD-10-CM

## 2023-01-21 DIAGNOSIS — J342 Deviated nasal septum: Secondary | ICD-10-CM

## 2023-01-21 DIAGNOSIS — R0981 Nasal congestion: Secondary | ICD-10-CM | POA: Diagnosis not present

## 2023-01-21 DIAGNOSIS — R42 Dizziness and giddiness: Secondary | ICD-10-CM | POA: Diagnosis not present

## 2023-01-21 DIAGNOSIS — J343 Hypertrophy of nasal turbinates: Secondary | ICD-10-CM | POA: Diagnosis not present

## 2023-01-21 DIAGNOSIS — J3489 Other specified disorders of nose and nasal sinuses: Secondary | ICD-10-CM | POA: Diagnosis not present

## 2023-01-21 DIAGNOSIS — J329 Chronic sinusitis, unspecified: Secondary | ICD-10-CM | POA: Diagnosis not present

## 2023-01-21 DIAGNOSIS — R0982 Postnasal drip: Secondary | ICD-10-CM | POA: Diagnosis not present

## 2023-01-21 NOTE — Patient Instructions (Signed)
-   continue Flonase and Zyrtec daily - schedule CT sinuses  - continue vestibular rehab - if sx worsen, see PCP to get referred to Neurology/Cards  - return to see [place] after CT sinuses

## 2023-01-21 NOTE — Progress Notes (Signed)
ENT CONSULT:  Reason for Consult: dizziness x 4 weeks/currently better and nasal congestion concern for sinus infection  HPI: Lynn [name] is an 72 y.o. female with hx of OA, DDD, lumbar spine problems, bilateral knee arthritis, seasonal and environmental allergies, on Zyrtec and Flonase, who is here for 4 weeks of dizziness/vertigo, which improved with vestibular rehab/PT and nasal congestion/facial pain and pressure.   Felt off balance and vertigo when she got up in the morning and it was seconds of dizziness/sx of feeling off balance. She also had some nausea with it and symptoms of nasal congestion and facial pain pressure concerning for sinus infection. Was seen by PCP, got a steroid shot and was referred to neurorehab for vestibular therapy.  PT notes indicate that she had negative Dix-Hallpike, she underwent vestibular therapy for balance strengthening.  In regards to her symptoms she feels significantly better , last episode of feeling off balance was 4 to 5 days ago .    She had prior allergy testing years ago and it was positive to dust, mold and cat dander mild reaction.  She has a cat in her home.  Currently on Zyrtec and Flonase. No sinus or nasal surgeries. She had sinus infections that required abx in the past, has been a while since she had 1. She had Meclizine given to her, has not needed it. No ear surgeries. Able to walk and no longer has nausea. No headache or vision changes.    Records Reviewed:  PT Note by Nedra Hai 01/11/2023 Non-Vestibular symptoms: headaches, tinnitus, and nausea/vomiting             Type of dizziness: Imbalance (Disequilibrium), Unsteady with head/body turns, and "Swimmyheaded"             Frequency: nearly every time she gets up in the morning             Duration: about a min             Aggravating factors: Induced by position change: supine to sit and Worse in the morning             Relieving factors: rest and sitting still              Progression of symptoms: better  Office Note by  Zandra Abts NP 12/29/22 Seen for headache and dizziness, received steroid pack and  Administered Depo-Medrol 80mg  injection during appointment for headache and dizziness as patient requested to see if this will help with her symptoms as it has previously.  -Recommend to follow up with physical therapy tomorrow and ENT in August.   -Can continue taking Meclizine if needed for dizziness.  -Placed a referral to dermatology for history of lesions on face and needs a skin assessment    Past Medical History:  Diagnosis Date   Allergy    Arthritis    left knee   Basal cell carcinoma of skin 10/14/1994   Right breast   Basal cell carcinoma of skin 12/07/2011   Left inner elbow - TX p BX   Hyperlipidemia    Hypertension    Hypothyroidism    Squamous cell carcinoma of skin 12/07/2011   Left lower leg - TX p BX   Squamous cell carcinoma of skin 02/02/2017   Left anterior thigh - CX3 + 5FU    Past Surgical History:  Procedure Laterality Date   ABDOMINAL HYSTERECTOMY     c-section 1983     catract  Right 01/15/2022  CESAREAN SECTION  06/27/1981   COLONOSCOPY     EYE SURGERY  01/15/22   Cataract surgery right eye   KNEE ARTHROSCOPY     PARTIAL HYSTERECTOMY     POLYPECTOMY     TONSILLECTOMY     TUBAL LIGATION      Family History  Problem Relation Age of Onset   Colon cancer Father    Cancer Father        skin   Colon polyps Sister    Colon polyps Sister    Colon polyps Sister    Colon polyps Sister    Cancer Maternal Grandfather    Cancer Paternal Grandfather        skin   Pancreatic cancer Neg Hx    Stomach cancer Neg Hx    Esophageal cancer Neg Hx    Rectal cancer Neg Hx     Social History:  reports that she has never smoked. She has never used smokeless tobacco. She reports current alcohol use. She reports that she does not use drugs.  Allergies:  Allergies  Allergen Reactions   Sulfa Antibiotics Nausea Only  and Nausea And Vomiting   Brimonidine Tartrate     Medications: I have reviewed the patient's current medications.  The PMH, PSH, Medications, Allergies, and SH were reviewed and updated.  ROS: Constitutional: Negative for fever, weight loss and weight gain. Cardiovascular: Negative for chest pain and dyspnea on exertion. Respiratory: Is not experiencing shortness of breath at rest. Gastrointestinal: Negative for nausea and vomiting. Neurological: Negative for headaches. Psychiatric: The patient is not nervous/anxious  Blood pressure (!) 168/92, pulse (!) 58, height 5\' 2"  (1.575 m), weight 188 lb (85.3 kg), SpO2 95%.  PHYSICAL EXAM:  Exam: General: Well-developed, well-nourished Communication and Voice: Clear pitch and clarity Respiratory Respiratory effort: Equal inspiration and expiration without stridor Cardiovascular Peripheral Vascular: Warm extremities with equal color/perfusion Eyes: No nystagmus with equal extraocular motion bilaterally Neuro/Psych/Balance: Patient oriented to person, place, and time; Appropriate mood and affect; Gait is intact with no imbalance; Cranial nerves I-XII are intact Head and Face Inspection: Normocephalic and atraumatic without mass or lesion Palpation: Facial skeleton intact without bony stepoffs Salivary Glands: No mass or tenderness Facial Strength: Facial motility symmetric and full bilaterally ENT Pinna: External ear intact and fully developed External canal: Canal is patent with intact skin Tympanic Membrane: Clear and mobile External Nose: No scar or anatomic deformity Internal Nose: Septum is relatively straight. No polyp, or purulence. Mucosal edema and erythema present. Bilateral inferior turbinate hypertrophy.  Lips, Teeth, and gums: Mucosa and teeth intact and viable TMJ: No pain to palpation with full mobility Oral cavity/oropharynx: No erythema or exudate, no lesions present Nasopharynx: No mass or lesion with intact  mucosa Neck Neck and Trachea: Midline trachea without mass or lesion Thyroid: No mass or nodularity Lymphatics: No lymphadenopathy  Procedure:   PROCEDURE NOTE: nasal endoscopy  Preoperative diagnosis: chronic sinusitis symptoms  Postoperative diagnosis: same  Procedure: Diagnostic nasal endoscopy (09811)  Surgeon: Ashok Croon, M.D.  Anesthesia: Topical lidocaine and Afrin  H&P REVIEW: The patient's history and physical were reviewed today prior to procedure. All medications were reviewed and updated as well. Complications: None Condition is stable throughout exam Indications and consent: The patient presents with symptoms of chronic sinusitis not responding to previous therapies. All the risks, benefits, and potential complications were reviewed with the patient preoperatively and informed consent was obtained. The time out was completed with confirmation of the correct procedure.   Procedure:  The patient was seated upright in the clinic. Topical lidocaine and Afrin were applied to the nasal cavity. After adequate anesthesia had occurred, the rigid nasal endoscope was passed into the nasal cavity. The nasal mucosa, turbinates, septum, and sinus drainage pathways were visualized bilaterally. This revealed no purulence or significant secretions that might be cultured. There were no polyps or sites of significant inflammation. The mucosa was intact and there was no crusting present. The scope was then slowly withdrawn and the patient tolerated the procedure well. There were no complications or blood loss.   Studies Reviewed: CT max face done 03/04/2015 FINDINGS: Scattered beam hardening artifacts of dental origin.   Visualized intracranial contents unremarkable.   Orbital soft tissue planes clear.   Nasal septum midline.   Concha bullosa of the middle turbinates.   Paranasal sinuses, mastoid air cells and middle ear cavities clear bilaterally.   No sinus opacification,  significant mucosal thickening or air-fluid levels.   No fracture or bone destruction.   IMPRESSION: No acute sinus abnormalities.  Assessment/Plan: Encounter Diagnoses  Name Primary?   Benign paroxysmal positional vertigo due to bilateral vestibular disorder    Vertigo Yes   Chronic sinusitis, unspecified location    Nasal congestion    Environmental and seasonal allergies    Post-nasal drip    Nasal septal deviation    Concha bullosa    Hypertrophy of both inferior nasal turbinates    Nasal obstruction    72 year old female with history of generative disc disease and osteoarthritis chronic lower back pain, history of environmental allergies, currently on Zyrtec and Flonase, prior allergy testing positive to allergies for cat dander, dust mites, molds, who is here for an episode of intermittent feeling of dizziness off-balance sensation triggered by body movement lasting for minutes.  Received steroid shot by PCP, and initially required meclizine however, after completing vestibular rehab for balance strengthening exercises, her dizziness balance problem is better.  Records indicate that she had negative Dix-Hallpike maneuver with PT. I suspect that her balance problems are likely related to history of chronic back pain and stiffness.  She also reports nasal congestion symptoms of postnasal drainage and nasal obstruction, no recent imaging of her sinuses.  I reviewed max face CT done back in 2016 and it shows patent paranasal sinuses and evidence of bilateral concha bullosa with minimal septal deviation and inferior turbinate hypertrophy.  Nasal endoscopy today did not show any evidence of polyps or purulence, but her nasal passages were somewhat narrow and there is evidence of mucosal edema and clear secretions.  Nasopharynx was free of masses or lesions or purulence.  I discussed with the patient common causes of dizziness and vertigo, and since her dizziness and balance problems have  resolved at this time, we will hold off on additional workup.  If symptoms return or she has another bout of symptoms in the future, I advised her to seek additional workup and testing including vestibular testing, and evaluation by neurology and cardiology.  To better assess her nasal congestion, will order CT sinuses, to rule out chronic sinus inflammation.  If negative for chronic sinusitis, we will focus on medical management of nasal congestion and allergies.   - continue Flonase and Zyrtec daily - schedule CT sinuses  - continue vestibular rehab - if sx worsen, see PCP to get referred to Neurology/Cards  - return to see [place] after CT sinuses   Thank you for allowing me to participate in the care of this patient. Please do  not hesitate to contact me with any questions or concerns.   Ashok Croon, MD Otolaryngology [facility] Phone: [phone] Fax: [phone]    01/21/2023, 7:47 PM

## 2023-01-27 ENCOUNTER — Encounter: Payer: Self-pay | Admitting: Physical Therapy

## 2023-01-27 ENCOUNTER — Ambulatory Visit (INDEPENDENT_AMBULATORY_CARE_PROVIDER_SITE_OTHER): Payer: Medicare Other | Admitting: Physical Therapy

## 2023-01-27 DIAGNOSIS — R2681 Unsteadiness on feet: Secondary | ICD-10-CM | POA: Diagnosis not present

## 2023-01-27 DIAGNOSIS — R42 Dizziness and giddiness: Secondary | ICD-10-CM

## 2023-01-27 NOTE — Therapy (Signed)
OUTPATIENT PHYSICAL THERAPY VESTIBULAR TREATMENT DISCHARGE SUMMARY     Patient Name: Lynn [name] MRN: [id number] DOB:01-29-1951, 72 y.o., female Today's Date: 01/27/2023  END OF SESSION:  PT End of Session - 01/27/23 1304     Visit Number 4    Number of Visits 12    Date for PT Re-Evaluation 02/15/23    Authorization Type Medicare    Progress Note Due on Visit 10    PT Start Time 1301    PT Stop Time 1324    PT Time Calculation (min) 23 min    Activity Tolerance Patient tolerated treatment well    Behavior During Therapy [facility] for tasks assessed/performed                Past Medical History:  Diagnosis Date   Allergy    Arthritis    left knee   Basal cell carcinoma of skin 10/14/1994   Right breast   Basal cell carcinoma of skin 12/07/2011   Left inner elbow - TX p BX   Hyperlipidemia    Hypertension    Hypothyroidism    Squamous cell carcinoma of skin 12/07/2011   Left lower leg - TX p BX   Squamous cell carcinoma of skin 02/02/2017   Left anterior thigh - CX3 + 5FU   Past Surgical History:  Procedure Laterality Date   ABDOMINAL HYSTERECTOMY     c-section 1983     catract  Right 01/15/2022   CESAREAN SECTION  06/27/1981   COLONOSCOPY     EYE SURGERY  01/15/22   Cataract surgery right eye   KNEE ARTHROSCOPY     PARTIAL HYSTERECTOMY     POLYPECTOMY     TONSILLECTOMY     TUBAL LIGATION     Patient Active Problem List   Diagnosis Date Noted   Degenerative disc disease, lumbar 08/24/2022   Combined forms of age-related cataract of right eye 01/14/2022   Regular astigmatism of right eye 01/14/2022   Lateral subluxation of right patella 08/15/2019   Pain of right hip joint 05/21/2018   Degenerative arthritis of knee, bilateral 10/18/2017   Elevated BP without diagnosis of hypertension 11/05/2016   Physical exam 04/22/2016   Hypothyroidism 10/24/2015   Hyperlipidemia 10/24/2015   Obesity (BMI 35.0-39.9 without comorbidity) 10/24/2015    PCP:  Sheliah Hatch, MD  REFERRING PROVIDER: Alveria Apley, NP  REFERRING DIAG: R42 (ICD-10-CM) - Vertigo  THERAPY DIAG:  Dizziness and giddiness  Unsteadiness on feet  ONSET DATE: 12/21/22  Rationale for Evaluation and Treatment: Rehabilitation  SUBJECTIVE:   SUBJECTIVE STATEMENT: Symptoms nearly resolved; feels 95% better. "How will I know when I'm done."   Pt accompanied by: self  PERTINENT HISTORY: HLD, HTN, OA  PAIN:  Are you having pain?  0/10 pain small amount of sinus pressure; no dizziness   PRECAUTIONS: None  RED FLAGS: None   WEIGHT BEARING RESTRICTIONS: No  FALLS: Has patient fallen in last 6 months? No  LIVING ENVIRONMENT: Lives with: lives alone Lives in: House/apartment Stairs: Yes: Internal: 15 steps; on left going up and External: 6 steps; can reach both (inside steps to basement)  PLOF: Independent and Leisure: travel  PATIENT GOALS: improve dizziness  OBJECTIVE:   DIAGNOSTIC FINDINGS: n/a  COGNITION: Overall cognitive status: Within functional limits for tasks assessed  POSTURE:  rounded shoulders and forward head  GAIT: Comments: independent with slightly guarded posture  VESTIBULAR ASSESSMENT:  GENERAL OBSERVATION: no symptoms (except sinus headache)   SYMPTOM BEHAVIOR:  Subjective history: see above  Non-Vestibular symptoms: headaches, tinnitus, and nausea/vomiting  Type of dizziness: Imbalance (Disequilibrium), Unsteady with head/body turns, and "Swimmyheaded"  Frequency: nearly every time she gets up in the morning  Duration: about a min  Aggravating factors: Induced by position change: supine to sit and Worse in the morning  Relieving factors: rest and sitting still  Progression of symptoms: better  OCULOMOTOR EXAM:  Ocular Alignment: normal  Ocular ROM: No Limitations  Spontaneous Nystagmus: absent  Gaze-Induced Nystagmus: absent  Smooth Pursuits: intact  Saccades: intact and symptom provoking  bil   VESTIBULAR - OCULAR REFLEX:   VOR: intact with increase in symptoms  Head-Impulse Test: HIT Right: positive with catch up saccade HIT Left: negative    POSITIONAL TESTING: Right Dix-Hallpike: no nystagmus Left Dix-Hallpike: no nystagmus Right Roll Test: no nystagmus Left Roll Test: no nystagmus and mild symptoms when rolling; resolved quickly  MOTION SENSITIVITY:  Motion Sensitivity Quotient Intensity: 0 = none, 1 = Lightheaded, 2 = Mild, 3 = Moderate, 4 = Severe, 5 = Vomiting  Intensity  1. Sitting to supine   2. Supine to L side   3. Supine to R side   4. Supine to sitting   5. L Hallpike-Dix   6. Up from L    7. R Hallpike-Dix   8. Up from R    9. Sitting, head tipped to L knee   10. Head up from L knee   11. Sitting, head tipped to R knee   12. Head up from R knee   13. Sitting head turns x5   14.Sitting head nods x5   15. In stance, 180 turn to L    16. In stance, 180 turn to R     OTHOSTATICS: Supine: 127/83 with HR 92 Standing: 132/89 with HR 80   VESTIBULAR TREATMENT:                                                                                                   DATE:  01/27/23 NMR Review of HEP with removal of habituation exercises no longer provoking symptoms Review of corner balance exercises with education on progression of exercises including partial tandem and full tandem stance Recommended continuation of HEP as well as increasing activity as tolerated    01/11/23 NMR EO with horizontal and vertical head turns 2x60 seconds each (second round on foam) Forward/backward walking x5 in room with horizontal head turns, vertical head turns STS x10 with horizontal head turns, then x10 with vertical head turns  Tandem stance solid surface 3x30 seconds B  Forward tandem gait x5 laps cues for appropriate pace + backwards walking  Narrow BOS on blue foam pad x30 seconds EO, x60 EC   01/06/23 Neuro Red-Ed Habituation with rolling and position  changes: Supine to roll to Lt then up to sitting x 3 reps; mild symptoms Corner balance activities on foam: EO with horizontal/vertical head turns x 10 each;  EC with horizontal/vertical head turns x 10 each EC with feet apart 2x10 sec Gaze x 1 standing with feet apart and together x  30 sec horizontal and vertical  01/04/23 Gaze Adaptation: x1 Viewing Horizontal: Position: sitting, Time: 30 sec, Reps: 1, and Comment: demonstrated for HEP and x1 Viewing Vertical:  Position: sitting, Time: 30 sec, Reps: 1, and Comment: demonstrated for HEP  PATIENT EDUCATION: Education details: HEP Person educated: Patient Education method: Programmer, multimedia, Facilities manager, and Handouts Education comprehension: verbalized understanding, returned demonstration, and needs further education  HOME EXERCISE PROGRAM: Access Code: [email or web address] URL: [email or web address] Date: 01/27/2023 Prepared by: Moshe Cipro  Exercises - Narrow Stance with Eyes Closed and Head Turns  - 2 x daily - 7 x weekly - 3 sets - 1 reps - 10 rotations each way - Romberg Stance Eyes Closed on Foam Pad  - 2 x daily - 7 x weekly - 1 sets - 5 reps - 10-15 sec hold - Standing Romberg to 1/2 Tandem Stance  - 1 x daily - 7 x weekly - 1 sets - 10 reps - 10-15 sec hold  GOALS: Goals reviewed with patient? Yes  SHORT TERM GOALS: Target date: 01/25/2023   Independent with initial HEP Goal status: MET 01/27/23  2.  Report 50% improvement in vestibular symptoms Goal status: MET 01/27/23  LONG TERM GOALS: Target date: 02/15/2023  Independent with final HEP Goal status: MET8/21/24  2.  Report 50% improvement in vestibular symptoms Goal status: MET 01/27/23  3.  Amb without any deviations or increase in symptoms for improved mobility Goal status: MET 01/27/23  ASSESSMENT:  CLINICAL IMPRESSION: Pt has met all goals and is ready for d/c from PT.  Will d/c PT today.  OBJECTIVE IMPAIRMENTS: Abnormal gait, decreased activity  tolerance, decreased balance, and dizziness.   ACTIVITY LIMITATIONS: bending, standing, bed mobility, and locomotion level  PARTICIPATION LIMITATIONS: meal prep, cleaning, laundry, driving, shopping, and community activity  PERSONAL FACTORS: 3+ comorbidities: HLD, HTN, OA  are also affecting patient's functional outcome.   REHAB POTENTIAL: Good  CLINICAL DECISION MAKING: Evolving/moderate complexity  EVALUATION COMPLEXITY: Moderate   PLAN:  PT FREQUENCY: 1-2x/week  PT DURATION: 6 weeks  PLANNED INTERVENTIONS: Therapeutic exercises, Therapeutic activity, Neuromuscular re-education, Balance training, Gait training, Patient/Family education, Self Care, Vestibular training, Canalith repositioning, Visual/preceptual remediation/compensation, Aquatic Therapy, Dry Needling, Electrical stimulation, Cryotherapy, Moist heat, Taping, Manual therapy, and Re-evaluation  PLAN FOR NEXT SESSION: d/c PT   Clarita Crane, PT, DPT 01/27/23 1:31 PM    PHYSICAL THERAPY DISCHARGE SUMMARY  Visits from Start of Care: 4  Current functional level related to goals / functional outcomes: See above   Remaining deficits: See above   Education / Equipment: HEP   Patient agrees to discharge. Patient goals were met. Patient is being discharged due to meeting the stated rehab goals.   Clarita Crane, PT, DPT 01/27/23 1:31 PM  [facility] [address] [place], [place], [address] Phone: [phone]   Fax:  [phone]

## 2023-02-02 ENCOUNTER — Other Ambulatory Visit: Payer: Self-pay | Admitting: Family Medicine

## 2023-02-02 ENCOUNTER — Ambulatory Visit (HOSPITAL_COMMUNITY)
Admission: RE | Admit: 2023-02-02 | Discharge: 2023-02-02 | Disposition: A | Discharge status: Home / Self Care | Payer: Medicare Other | Source: Ambulatory Visit | Attending: Otolaryngology | Admitting: Otolaryngology

## 2023-02-02 DIAGNOSIS — J329 Chronic sinusitis, unspecified: Secondary | ICD-10-CM | POA: Diagnosis not present

## 2023-02-10 ENCOUNTER — Ambulatory Visit (INDEPENDENT_AMBULATORY_CARE_PROVIDER_SITE_OTHER): Payer: Medicare Other | Admitting: *Deleted

## 2023-02-10 DIAGNOSIS — Z0000 Encounter for general adult medical examination without abnormal findings: Secondary | ICD-10-CM | POA: Diagnosis not present

## 2023-02-10 NOTE — Patient Instructions (Signed)
Lynn [name] , Thank you for taking time to come for your Medicare Wellness Visit. I appreciate your ongoing commitment to your health goals. Please review the following plan we discussed and let me know if I can assist you in the future.   Screening recommendations/referrals: Colonoscopy: up to date Mammogram: up to date Bone Density: up tod ate Recommended yearly ophthalmology/optometry visit for glaucoma screening and checkup Recommended yearly dental visit for hygiene and checkup  Vaccinations: Influenza vaccine: Education provided Pneumococcal vaccine: up to date Tdap vaccine: up to date Shingles vaccine: up to date       Preventive Care 65 Years and Older, Female Preventive care refers to lifestyle choices and visits with your health care provider that can promote health and wellness. What does preventive care include? A yearly physical exam. This is also called an annual well check. Dental exams once or twice a year. Routine eye exams. Ask your health care provider how often you should have your eyes checked. Personal lifestyle choices, including: Daily care of your teeth and gums. Regular physical activity. Eating a healthy diet. Avoiding tobacco and drug use. Limiting alcohol use. Practicing safe sex. Taking low-dose aspirin every day. Taking vitamin and mineral supplements as recommended by your health care provider. What happens during an annual well check? The services and screenings done by your health care provider during your annual well check will depend on your age, overall health, lifestyle risk factors, and family history of disease. Counseling  Your health care provider may ask you questions about your: Alcohol use. Tobacco use. Drug use. Emotional well-being. Home and relationship well-being. Sexual activity. Eating habits. History of falls. Memory and ability to understand (cognition). Work and work Astronomer. Reproductive health. Screening  You  may have the following tests or measurements: Height, weight, and BMI. Blood pressure. Lipid and cholesterol levels. These may be checked every 5 years, or more frequently if you are over 32 years old. Skin check. Lung cancer screening. You may have this screening every year starting at age 67 if you have a 30-pack-year history of smoking and currently smoke or have quit within the past 15 years. Fecal occult blood test (FOBT) of the stool. You may have this test every year starting at age 52. Flexible sigmoidoscopy or colonoscopy. You may have a sigmoidoscopy every 5 years or a colonoscopy every 10 years starting at age 28. Hepatitis C blood test. Hepatitis B blood test. Sexually transmitted disease (STD) testing. Diabetes screening. This is done by checking your blood sugar (glucose) after you have not eaten for a while (fasting). You may have this done every 1-3 years. Bone density scan. This is done to screen for osteoporosis. You may have this done starting at age 86. Mammogram. This may be done every 1-2 years. Talk to your health care provider about how often you should have regular mammograms. Talk with your health care provider about your test results, treatment options, and if necessary, the need for more tests. Vaccines  Your health care provider may recommend certain vaccines, such as: Influenza vaccine. This is recommended every year. Tetanus, diphtheria, and acellular pertussis (Tdap, Td) vaccine. You may need a Td booster every 10 years. Zoster vaccine. You may need this after age 93. Pneumococcal 13-valent conjugate (PCV13) vaccine. One dose is recommended after age 56. Pneumococcal polysaccharide (PPSV23) vaccine. One dose is recommended after age 37. Talk to your health care provider about which screenings and vaccines you need and how often you need them. This  information is not intended to replace advice given to you by your health care provider. Make sure you discuss any  questions you have with your health care provider. Document Released: 06/21/2015 Document Revised: 02/12/2016 Document Reviewed: 03/26/2015 Elsevier Interactive Patient Education  2017 [employer].  Fall Prevention in the Home Falls can cause injuries. They can happen to people of all ages. There are many things you can do to make your home safe and to help prevent falls. What can I do on the outside of my home? Regularly fix the edges of walkways and driveways and fix any cracks. Remove anything that might make you trip as you walk through a door, such as a raised step or threshold. Trim any bushes or trees on the path to your home. Use bright outdoor lighting. Clear any walking paths of anything that might make someone trip, such as rocks or tools. Regularly check to see if handrails are loose or broken. Make sure that both sides of any steps have handrails. Any raised decks and porches should have guardrails on the edges. Have any leaves, snow, or ice cleared regularly. Use sand or salt on walking paths during winter. Clean up any spills in your garage right away. This includes oil or grease spills. What can I do in the bathroom? Use night lights. Install grab bars by the toilet and in the tub and shower. Do not use towel bars as grab bars. Use non-skid mats or decals in the tub or shower. If you need to sit down in the shower, use a plastic, non-slip stool. Keep the floor dry. Clean up any water that spills on the floor as soon as it happens. Remove soap buildup in the tub or shower regularly. Attach bath mats securely with double-sided non-slip rug tape. Do not have throw rugs and other things on the floor that can make you trip. What can I do in the bedroom? Use night lights. Make sure that you have a light by your bed that is easy to reach. Do not use any sheets or blankets that are too big for your bed. They should not hang down onto the floor. Have a firm chair that has side  arms. You can use this for support while you get dressed. Do not have throw rugs and other things on the floor that can make you trip. What can I do in the kitchen? Clean up any spills right away. Avoid walking on wet floors. Keep items that you use a lot in easy-to-reach places. If you need to reach something above you, use a strong step stool that has a grab bar. Keep electrical cords out of the way. Do not use floor polish or wax that makes floors slippery. If you must use wax, use non-skid floor wax. Do not have throw rugs and other things on the floor that can make you trip. What can I do with my stairs? Do not leave any items on the stairs. Make sure that there are handrails on both sides of the stairs and use them. Fix handrails that are broken or loose. Make sure that handrails are as long as the stairways. Check any carpeting to make sure that it is firmly attached to the stairs. Fix any carpet that is loose or worn. Avoid having throw rugs at the top or bottom of the stairs. If you do have throw rugs, attach them to the floor with carpet tape. Make sure that you have a light switch at the top  of the stairs and the bottom of the stairs. If you do not have them, ask someone to add them for you. What else can I do to help prevent falls? Wear shoes that: Do not have high heels. Have rubber bottoms. Are comfortable and fit you well. Are closed at the toe. Do not wear sandals. If you use a stepladder: Make sure that it is fully opened. Do not climb a closed stepladder. Make sure that both sides of the stepladder are locked into place. Ask someone to hold it for you, if possible. Clearly mark and make sure that you can see: Any grab bars or handrails. First and last steps. Where the edge of each step is. Use tools that help you move around (mobility aids) if they are needed. These include: Canes. Walkers. Scooters. Crutches. Turn on the lights when you go into a dark area.  Replace any light bulbs as soon as they burn out. Set up your furniture so you have a clear path. Avoid moving your furniture around. If any of your floors are uneven, fix them. If there are any pets around you, be aware of where they are. Review your medicines with your doctor. Some medicines can make you feel dizzy. This can increase your chance of falling. Ask your doctor what other things that you can do to help prevent falls. This information is not intended to replace advice given to you by your health care provider. Make sure you discuss any questions you have with your health care provider. Document Released: 03/21/2009 Document Revised: 10/31/2015 Document Reviewed: 06/29/2014 Elsevier Interactive Patient Education  2017 [employer].

## 2023-02-10 NOTE — Progress Notes (Signed)
Subjective:   Lynn [name] is a 72 y.o. female who presents for Medicare Annual (Subsequent) preventive examination.  Visit Complete: Virtual  I connected with  Lynn [name] on 02/10/23 by a audio enabled telemedicine application and verified that I am speaking with the correct person using two identifiers.  Patient Location: Home  Provider Location: Home Office  I discussed the limitations of evaluation and management by telemedicine. The patient expressed understanding and agreed to proceed.  Patient Medicare AWV questionnaire was completed by the patient on 02-09-2023; I have confirmed that all information answered by patient is correct and no changes since this date.   Vital Signs: Unable to obtain new vitals due to this being a telehealth visit.  Review of Systems     Cardiac Risk Factors include: advanced age (>2men, >68 women)     Objective:    Today's Vitals   02/10/23 1110  PainSc: 4    There is no height or weight on file to calculate BMI.     02/10/2023   11:11 AM 01/04/2023    9:32 AM 01/29/2022   10:35 AM 10/25/2021    5:54 PM 12/30/2020    3:25 PM 12/26/2019    1:37 PM 10/22/2017    9:29 PM  Advanced Directives  Does Patient Have a Medical Advance Directive? Yes Yes Yes No Yes Yes No  Type of Estate agent of [employer] Power of [place];Living will Healthcare Power of [place];Living will  Healthcare Power of [employer] of [place];Living will   Copy of Healthcare Power of Attorney in Chart? Yes - validated most recent copy scanned in chart (See row information)  Yes - validated most recent copy scanned in chart (See row information)  No - copy requested Yes - validated most recent copy scanned in chart (See row information)   Would patient like information on creating a medical advance directive?       No - Patient declined    Current Medications (verified) Outpatient Encounter Medications as of  02/10/2023  Medication Sig   atorvastatin (LIPITOR) 20 MG tablet TAKE 1 TABLET BY MOUTH EVERY DAY   fluticasone (FLONASE) 50 MCG/ACT nasal spray Place 2 sprays into both nostrils daily.   furosemide (LASIX) 20 MG tablet TAKE 1 TABLET BY MOUTH EVERY DAY   gabapentin (NEURONTIN) 100 MG capsule TAKE 2 CAPSULES BY MOUTH 3 TIMES DAILY AS NEEDED.   levothyroxine (SYNTHROID) 100 MCG tablet TAKE 1 TABLET BY MOUTH EVERY DAY   meclizine (ANTIVERT) 25 MG tablet Take 25 mg by mouth 3 (three) times daily as needed for dizziness.   Multiple Vitamin (MULTIVITAMIN) tablet Take 1 tablet by mouth daily.   Probiotic Product (PROBIOTIC-10) CHEW Chew by mouth.   promethazine (PHENERGAN) 25 MG tablet TAKE 1 TABLET BY MOUTH EVERY 6 HOURS AS NEEDED FOR NAUSEA OR VOMITING.   imiquimod (ALDARA) 5 % cream Apply topically at bedtime.   No facility-administered encounter medications on file as of 02/10/2023.    Allergies (verified) Sulfa antibiotics and Brimonidine tartrate   History: Past Medical History:  Diagnosis Date   Allergy    Arthritis    left knee   Basal cell carcinoma of skin 10/14/1994   Right breast   Basal cell carcinoma of skin 12/07/2011   Left inner elbow - TX p BX   Hyperlipidemia    Hypertension    Hypothyroidism    Squamous cell carcinoma of skin 12/07/2011   Left lower leg - TX  p BX   Squamous cell carcinoma of skin 02/02/2017   Left anterior thigh - CX3 + 5FU   Past Surgical History:  Procedure Laterality Date   ABDOMINAL HYSTERECTOMY     c-section 1983     catract  Right 01/15/2022   CESAREAN SECTION  06/27/1981   COLONOSCOPY     EYE SURGERY  01/15/22   Cataract surgery right eye   KNEE ARTHROSCOPY     PARTIAL HYSTERECTOMY     POLYPECTOMY     TONSILLECTOMY     TUBAL LIGATION     Family History  Problem Relation Age of Onset   Colon cancer Father    Cancer Father        skin   Colon polyps Sister    Colon polyps Sister    Colon polyps Sister    Colon polyps Sister     Cancer Maternal Grandfather    Cancer Paternal Grandfather        skin   Pancreatic cancer Neg Hx    Stomach cancer Neg Hx    Esophageal cancer Neg Hx    Rectal cancer Neg Hx    Social History   Socioeconomic History   Marital status: Widowed    Spouse name: Not on file   Number of children: Not on file   Years of education: Not on file   Highest education level: Associate degree: occupational, Scientist, product/process development, or vocational program  Occupational History   Occupation: retired  Tobacco Use   Smoking status: Never   Smokeless tobacco: Never   Tobacco comments:    Do not and never have smoked  Vaping Use   Vaping status: Never Used  Substance and Sexual Activity   Alcohol use: Yes   Drug use: No   Sexual activity: Not Currently    Birth control/protection: Post-menopausal  Other Topics Concern   Not on file  Social History Narrative   Not on file   Social Determinants of Health   Financial Resource Strain: Low Risk  (02/10/2023)   Overall Financial Resource Strain (CARDIA)    Difficulty of Paying Living Expenses: Not hard at all  Food Insecurity: No Food Insecurity (02/10/2023)   Hunger Vital Sign    Worried About Running Out of Food in the Last Year: Never true    Ran Out of Food in the Last Year: Never true  Transportation Needs: No Transportation Needs (02/10/2023)   PRAPARE - Administrator, Civil Service (Medical): No    Lack of Transportation (Non-Medical): No  Physical Activity: Insufficiently Active (02/10/2023)   Exercise Vital Sign    Days of Exercise per Week: 1 day    Minutes of Exercise per Session: 20 min  Stress: No Stress Concern Present (02/10/2023)   [employer] of Occupational Health - Occupational Stress Questionnaire    Feeling of Stress : Only a little  Social Connections: Moderately Isolated (02/10/2023)   Social Connection and Isolation Panel [NHANES]    Frequency of Communication with Friends and Family: More than three times a week     Frequency of Social Gatherings with Friends and Family: Once a week    Attends Religious Services: Never    Database administrator or Organizations: Yes    Attends Banker Meetings: 1 to 4 times per year    Marital Status: Widowed    Tobacco Counseling Counseling given: Not Answered Tobacco comments: Do not and never have smoked   Clinical Intake:  Pre-visit preparation  completed: Yes  Pain : 0-10 Pain Score: 4  Pain Location: Knee Pain Orientation: Left, Right Pain Descriptors / Indicators: Burning, Aching Pain Onset: More than a month ago Pain Frequency: Constant Effect of Pain on Daily Activities: when acts up     Diabetes: No  How often do you need to have someone help you when you read instructions, pamphlets, or other written materials from your doctor or pharmacy?: 1 - Never  Interpreter Needed?: No  Information entered by :: Remi Haggard LPN   Activities of Daily Living    02/10/2023   11:13 AM 02/09/2023    1:50 PM  In your present state of health, do you have any difficulty performing the following activities:  Hearing? 0 0  Vision? 0 0  Difficulty concentrating or making decisions? 0 0  Walking or climbing stairs? 0 0  Dressing or bathing? 0 0  Doing errands, shopping? 0 0  Preparing Food and eating ? N N  Using the Toilet? N N  In the past six months, have you accidently leaked urine? Y Y  Do you have problems with loss of bowel control? N N  Managing your Medications? N N  Managing your Finances? N N  Housekeeping or managing your Housekeeping? N N    Patient Care Team: Sheliah Hatch, MD as PCP - General (Family Medicine) Janalyn Harder, MD (Inactive) as Consulting Physician (Dermatology) Louis Meckel, MD (Inactive) as Consulting Physician (Gastroenterology) Dahlia Byes, [facility] (Inactive) as Pharmacist (Pharmacist)  Indicate any recent Medical Services you may have received from other than [facility] providers in the past year  (date may be approximate).     Assessment:   This is a routine wellness examination for Lynn [name].  Hearing/Vision screen Hearing Screening - Comments:: No trouble hearing Vision Screening - Comments:: Up to date Vision source Dr. Demetrius Charity  Dietary issues and exercise activities discussed:     Goals Addressed             This Visit's Progress    Increase physical activity         Depression Screen    02/10/2023   11:13 AM 12/21/2022    4:36 PM 01/29/2022   10:36 AM 01/22/2022   10:47 AM 05/19/2021    1:21 PM 12/30/2020    3:07 PM 10/24/2020    8:58 AM  PHQ 2/9 Scores  PHQ - 2 Score 0 0 0 1 0 0 0  PHQ- 9 Score 1 0  4 2  0    Fall Risk    02/10/2023   11:13 AM 02/09/2023    1:50 PM 12/29/2022    2:07 PM 12/21/2022    4:36 PM 01/29/2022   10:36 AM  Fall Risk   Falls in the past year? 0 0 0 0 0  Number falls in past yr: 0 0 0 0 0  Injury with Fall? 0 0 0 0 0  Risk for fall due to :   No Fall Risks No Fall Risks   Follow up Falls evaluation completed;Education provided;Falls prevention discussed  Falls evaluation completed Falls evaluation completed Falls evaluation completed;Education provided    MEDICARE RISK AT HOME: Medicare Risk at Home Any stairs in or around the home?: Yes If so, are there any without handrails?: No Home free of loose throw rugs in walkways, pet beds, electrical cords, etc?: Yes Adequate lighting in your home to reduce risk of falls?: Yes Life alert?: No Use of a cane, walker or w/c?:  No Grab bars in the bathroom?: Yes Shower chair or bench in shower?: No Elevated toilet seat or a handicapped toilet?: No  TIMED UP AND GO:  Was the test performed?  No    Cognitive Function:        02/10/2023   11:09 AM 01/29/2022   10:42 AM 12/26/2019    1:42 PM  6CIT Screen  What Year? 0 points  0 points  What month? 0 points  0 points  What time? 0 points 0 points 0 points  Count back from 20 0 points 0 points 0 points  Months in reverse 0 points 0 points 0  points  Repeat phrase 0 points 0 points 0 points  Total Score 0 points  0 points    Immunizations Immunization History  Administered Date(s) Administered   Fluad Quad(high Dose 65+) 02/17/2019, 03/11/2020, 03/17/2021   Influenza, High Dose Seasonal PF 03/07/2018   Influenza, Seasonal, Injecte, Preservative Fre 03/08/2012, 03/13/2013, 03/15/2014, 03/19/2015   Influenza,inj,Quad PF,6+ Mos 04/02/2016, 02/02/2017, 01/22/2022   Moderna Covid-19 Vaccine Bivalent Booster 38yrs & up 04/21/2021   Moderna Sars-Covid-2 Vaccination 07/26/2019, 08/17/2019, 05/23/2020   Pneumococcal Conjugate-13 10/24/2015   Pneumococcal Polysaccharide-23 06/09/2017   Td 07/25/2018   Tdap 10/24/2011   Zoster Recombinant(Shingrix) 02/03/2018, 06/22/2018   Zoster, Live 10/24/2014    TDAP status: Up to date  Flu Vaccine status: Due, Education has been provided regarding the importance of this vaccine. Advised may receive this vaccine at local pharmacy or Health Dept. Aware to provide a copy of the vaccination record if obtained from local pharmacy or Health Dept. Verbalized acceptance and understanding.  Pneumococcal vaccine status: Up to date  Covid-19 vaccine status: Information provided on how to obtain vaccines.   Qualifies for Shingles Vaccine? No   Zostavax completed Yes   Shingrix Completed?: Yes  Screening Tests Health Maintenance  Topic Date Due   INFLUENZA VACCINE  01/07/2023   MAMMOGRAM  02/28/2023   Medicare Annual Wellness (AWV)  02/10/2024   Colonoscopy  02/23/2024   DTaP/Tdap/Td (3 - Td or Tdap) 07/25/2028   Pneumonia Vaccine 58+ Years old  Completed   DEXA SCAN  Completed   Hepatitis C Screening  Completed   Zoster Vaccines- Shingrix  Completed   HPV VACCINES  Aged Out   COVID-19 Vaccine  Discontinued    Health Maintenance  Health Maintenance Due  Topic Date Due   INFLUENZA VACCINE  01/07/2023    Colorectal cancer screening: Type of screening: Colonoscopy. Completed 2025.  Repeat every 5 years  Mammogram status: Completed  . Repeat every year  Bone Density status: Completed 2023. Results reflect: Bone density results: OSTEOPOROSIS. Repeat every 2 years.  Lung Cancer Screening: (Low Dose CT Chest recommended if Age 82-80 years, 20 pack-year currently smoking OR have quit w/in 15years.) does not qualify.   Lung Cancer Screening Referral:   Additional Screening:  Hepatitis C Screening: does not qualify; Completed 2022  Vision Screening: Recommended annual ophthalmology exams for early detection of glaucoma and other disorders of the eye. Is the patient up to date with their annual eye exam?  Yes  Who is the provider or what is the name of the office in which the patient attends annual eye exams? Dr. Urban [place] If pt is not established with a provider, would they like to be referred to a provider to establish care? No .   Dental Screening: Recommended annual dental exams for proper oral hygiene    Community Resource Referral / Chronic  Care Management: CRR required this visit?  No   CCM required this visit?  No     Plan:     I have personally reviewed and noted the following in the patient's chart:   Medical and social history Use of alcohol, tobacco or illicit drugs  Current medications and supplements including opioid prescriptions. Patient is not currently taking opioid prescriptions. Functional ability and status Nutritional status Physical activity Advanced directives List of other physicians Hospitalizations, surgeries, and ER visits in previous 12 months Vitals Screenings to include cognitive, depression, and falls Referrals and appointments  In addition, I have reviewed and discussed with patient certain preventive protocols, quality metrics, and best practice recommendations. A written personalized care plan for preventive services as well as general preventive health recommendations were provided to patient.     Remi Haggard,  LPN   06/13/1094   After Visit Summary: (MyChart) Due to this being a telephonic visit, the after visit summary with patients personalized plan was offered to patient via MyChart   Nurse Notes:

## 2023-02-11 NOTE — Progress Notes (Deleted)
[name] [address] [place] [address] Phone: [phone] Subjective:    I'm seeing this patient by the request  of:  Sheliah Hatch, MD  CC:   [id number]  10/26/2022 Bilateral worsening pain in the knees again.  Would like to try aquatic therapy with this worsening progression.  Discussed with patient icing regimen and home exercises.  Patient's BMI is over 35 so would be a candidate for knee replacement if she ever needs it. Patient will try these interventions and follow-up with me again in 3 months.     Updated 02/17/2023 Lynn [name] is a 72 y.o. female coming in with complaint of knee pain       Past Medical History:  Diagnosis Date   Allergy    Arthritis    left knee   Basal cell carcinoma of skin 10/14/1994   Right breast   Basal cell carcinoma of skin 12/07/2011   Left inner elbow - TX p BX   Hyperlipidemia    Hypertension    Hypothyroidism    Squamous cell carcinoma of skin 12/07/2011   Left lower leg - TX p BX   Squamous cell carcinoma of skin 02/02/2017   Left anterior thigh - CX3 + 5FU   Past Surgical History:  Procedure Laterality Date   ABDOMINAL HYSTERECTOMY     c-section 1983     catract  Right 01/15/2022   CESAREAN SECTION  06/27/1981   COLONOSCOPY     EYE SURGERY  01/15/22   Cataract surgery right eye   KNEE ARTHROSCOPY     PARTIAL HYSTERECTOMY     POLYPECTOMY     TONSILLECTOMY     TUBAL LIGATION     Social History   Socioeconomic History   Marital status: Widowed    Spouse name: Not on file   Number of children: Not on file   Years of education: Not on file   Highest education level: Associate degree: occupational, Scientist, product/process development, or vocational program  Occupational History   Occupation: retired  Tobacco Use   Smoking status: Never   Smokeless tobacco: Never   Tobacco comments:    Do not and never have smoked  Vaping Use   Vaping status: Never Used  Substance and Sexual  Activity   Alcohol use: Yes   Drug use: No   Sexual activity: Not Currently    Birth control/protection: Post-menopausal  Other Topics Concern   Not on file  Social History Narrative   Not on file   Social Determinants of Health   Financial Resource Strain: Low Risk  (02/10/2023)   Overall Financial Resource Strain (CARDIA)    Difficulty of Paying Living Expenses: Not hard at all  Food Insecurity: No Food Insecurity (02/10/2023)   Hunger Vital Sign    Worried About Running Out of Food in the Last Year: Never true    Ran Out of Food in the Last Year: Never true  Transportation Needs: No Transportation Needs (02/10/2023)   PRAPARE - Administrator, Civil Service (Medical): No    Lack of Transportation (Non-Medical): No  Physical Activity: Insufficiently Active (02/10/2023)   Exercise Vital Sign    Days of Exercise per Week: 1 day    Minutes of Exercise per Session: 20 min  Stress: No Stress Concern Present (02/10/2023)   [employer] of Occupational Health - Occupational Stress Questionnaire    Feeling of Stress : Only a little  Social Connections: Moderately Isolated (  02/10/2023)   Social Connection and Isolation Panel [NHANES]    Frequency of Communication with Friends and Family: More than three times a week    Frequency of Social Gatherings with Friends and Family: Once a week    Attends Religious Services: Never    Database administrator or Organizations: Yes    Attends Banker Meetings: 1 to 4 times per year    Marital Status: Widowed   Allergies  Allergen Reactions   Sulfa Antibiotics Nausea Only and Nausea And Vomiting   Brimonidine Tartrate    Family History  Problem Relation Age of Onset   Colon cancer Father    Cancer Father        skin   Colon polyps Sister    Colon polyps Sister    Colon polyps Sister    Colon polyps Sister    Cancer Maternal Grandfather    Cancer Paternal Grandfather        skin   Pancreatic cancer Neg Hx     Stomach cancer Neg Hx    Esophageal cancer Neg Hx    Rectal cancer Neg Hx     Current Outpatient Medications (Endocrine & Metabolic):    levothyroxine (SYNTHROID) 100 MCG tablet, TAKE 1 TABLET BY MOUTH EVERY DAY  Current Outpatient Medications (Cardiovascular):    atorvastatin (LIPITOR) 20 MG tablet, TAKE 1 TABLET BY MOUTH EVERY DAY   furosemide (LASIX) 20 MG tablet, TAKE 1 TABLET BY MOUTH EVERY DAY  Current Outpatient Medications (Respiratory):    fluticasone (FLONASE) 50 MCG/ACT nasal spray, Place 2 sprays into both nostrils daily.   promethazine (PHENERGAN) 25 MG tablet, TAKE 1 TABLET BY MOUTH EVERY 6 HOURS AS NEEDED FOR NAUSEA OR VOMITING.    Current Outpatient Medications (Other):    gabapentin (NEURONTIN) 100 MG capsule, TAKE 2 CAPSULES BY MOUTH 3 TIMES DAILY AS NEEDED.   imiquimod (ALDARA) 5 % cream, Apply topically at bedtime.   meclizine (ANTIVERT) 25 MG tablet, Take 25 mg by mouth 3 (three) times daily as needed for dizziness.   Multiple Vitamin (MULTIVITAMIN) tablet, Take 1 tablet by mouth daily.   Probiotic Product (PROBIOTIC-10) CHEW, Chew by mouth.   Reviewed prior external information including notes and imaging from  primary care provider As well as notes that were available from care everywhere and other healthcare systems.  Past medical history, social, surgical and family history all reviewed in electronic medical record.  No pertanent information unless stated regarding to the chief complaint.   Review of Systems:  No headache, visual changes, nausea, vomiting, diarrhea, constipation, dizziness, abdominal pain, skin rash, fevers, chills, night sweats, weight loss, swollen lymph nodes, body aches, joint swelling, chest pain, shortness of breath, mood changes. POSITIVE muscle aches  Objective  There were no vitals taken for this visit.   General: No apparent distress alert and oriented x3 mood and affect normal, dressed appropriately.  HEENT: Pupils equal,  extraocular movements intact  Respiratory: Patient's speak in full sentences and does not appear short of breath  Cardiovascular: No lower extremity edema, non tender, no erythema      Impression and Recommendations:

## 2023-02-12 NOTE — Progress Notes (Unsigned)
[name] [address] [place] [address] Phone: [phone] Subjective:   [name], am serving as a scribe for Dr. Antoine Primas.  I'm seeing this patient by the request  of:  Sheliah Hatch, MD  CC: Bilateral knee pain follow-up  [id number]  10/26/2022 Bilateral worsening pain in the knees again.  Would like to try aquatic therapy with this worsening progression.  Discussed with patient icing regimen and home exercises.  Patient's BMI is over 35 so would be a candidate for knee replacement if she ever needs it. Patient will try these interventions and follow-up with me again in 3 months.  Update 02/15/2023 Lynn [name] is a 72 y.o. female coming in with complaint of B knee pain.  Known arthritic changes.  Was having worsening pain.  Last injections were nearly 4 months ago.  Patient states continues to have some difficulty as well.  Patient also states would like injections in knees. Real painful this last month. Would like aquatic therapy.    Past Medical History:  Diagnosis Date   Allergy    Arthritis    left knee   Basal cell carcinoma of skin 10/14/1994   Right breast   Basal cell carcinoma of skin 12/07/2011   Left inner elbow - TX p BX   Hyperlipidemia    Hypertension    Hypothyroidism    Squamous cell carcinoma of skin 12/07/2011   Left lower leg - TX p BX   Squamous cell carcinoma of skin 02/02/2017   Left anterior thigh - CX3 + 5FU   Past Surgical History:  Procedure Laterality Date   ABDOMINAL HYSTERECTOMY     c-section 1983     catract  Right 01/15/2022   CESAREAN SECTION  06/27/1981   COLONOSCOPY     EYE SURGERY  01/15/22   Cataract surgery right eye   KNEE ARTHROSCOPY     PARTIAL HYSTERECTOMY     POLYPECTOMY     TONSILLECTOMY     TUBAL LIGATION     Social History   Socioeconomic History   Marital status: Widowed    Spouse name: Not on file   Number of children: Not on file   Years of  education: Not on file   Highest education level: Associate degree: occupational, Scientist, product/process development, or vocational program  Occupational History   Occupation: retired  Tobacco Use   Smoking status: Never   Smokeless tobacco: Never   Tobacco comments:    Do not and never have smoked  Vaping Use   Vaping status: Never Used  Substance and Sexual Activity   Alcohol use: Yes   Drug use: No   Sexual activity: Not Currently    Birth control/protection: Post-menopausal  Other Topics Concern   Not on file  Social History Narrative   Not on file   Social Determinants of Health   Financial Resource Strain: Low Risk  (02/10/2023)   Overall Financial Resource Strain (CARDIA)    Difficulty of Paying Living Expenses: Not hard at all  Food Insecurity: No Food Insecurity (02/10/2023)   Hunger Vital Sign    Worried About Running Out of Food in the Last Year: Never true    Ran Out of Food in the Last Year: Never true  Transportation Needs: No Transportation Needs (02/10/2023)   PRAPARE - Administrator, Civil Service (Medical): No    Lack of Transportation (Non-Medical): No  Physical Activity: Insufficiently Active (02/10/2023)   Exercise  Vital Sign    Days of Exercise per Week: 1 day    Minutes of Exercise per Session: 20 min  Stress: No Stress Concern Present (02/10/2023)   [employer] of Occupational Health - Occupational Stress Questionnaire    Feeling of Stress : Only a little  Social Connections: Moderately Isolated (02/10/2023)   Social Connection and Isolation Panel [NHANES]    Frequency of Communication with Friends and Family: More than three times a week    Frequency of Social Gatherings with Friends and Family: Once a week    Attends Religious Services: Never    Database administrator or Organizations: Yes    Attends Banker Meetings: 1 to 4 times per year    Marital Status: Widowed   Allergies  Allergen Reactions   Sulfa Antibiotics Nausea Only and Nausea And  Vomiting   Brimonidine Tartrate    Family History  Problem Relation Age of Onset   Colon cancer Father    Cancer Father        skin   Colon polyps Sister    Colon polyps Sister    Colon polyps Sister    Colon polyps Sister    Cancer Maternal Grandfather    Cancer Paternal Grandfather        skin   Pancreatic cancer Neg Hx    Stomach cancer Neg Hx    Esophageal cancer Neg Hx    Rectal cancer Neg Hx     Current Outpatient Medications (Endocrine & Metabolic):    levothyroxine (SYNTHROID) 100 MCG tablet, TAKE 1 TABLET BY MOUTH EVERY DAY  Current Outpatient Medications (Cardiovascular):    atorvastatin (LIPITOR) 20 MG tablet, TAKE 1 TABLET BY MOUTH EVERY DAY   furosemide (LASIX) 20 MG tablet, TAKE 1 TABLET BY MOUTH EVERY DAY  Current Outpatient Medications (Respiratory):    fluticasone (FLONASE) 50 MCG/ACT nasal spray, Place 2 sprays into both nostrils daily.   promethazine (PHENERGAN) 25 MG tablet, TAKE 1 TABLET BY MOUTH EVERY 6 HOURS AS NEEDED FOR NAUSEA OR VOMITING.    Current Outpatient Medications (Other):    gabapentin (NEURONTIN) 100 MG capsule, TAKE 2 CAPSULES BY MOUTH 3 TIMES DAILY AS NEEDED.   imiquimod (ALDARA) 5 % cream, Apply topically at bedtime.   meclizine (ANTIVERT) 25 MG tablet, Take 25 mg by mouth 3 (three) times daily as needed for dizziness.   Multiple Vitamin (MULTIVITAMIN) tablet, Take 1 tablet by mouth daily.   Probiotic Product (PROBIOTIC-10) CHEW, Chew by mouth.   Reviewed prior external information including notes and imaging from  primary care provider As well as notes that were available from care everywhere and other healthcare systems.  Past medical history, social, surgical and family history all reviewed in electronic medical record.  No pertanent information unless stated regarding to the chief complaint.   Review of Systems:  No headache, visual changes, nausea, vomiting, diarrhea, constipation, dizziness, abdominal pain, skin rash,  fevers, chills, night sweats, weight loss, swollen lymph nodes, body aches, chest pain, shortness of breath, mood changes. POSITIVE muscle aches, joint swelling  Objective  Blood pressure (!) 130/90, pulse 87, height 5\' 2"  (1.575 m), weight 189 lb (85.7 kg), SpO2 97%.   General: No apparent distress alert and oriented x3 mood and affect normal, dressed appropriately.  HEENT: Pupils equal, extraocular movements intact  Respiratory: Patient's speak in full sentences and does not appear short of breath  Cardiovascular: No lower extremity edema, non tender, no erythema  Knee exam shows the  patient does have crepitus noted.  Lateral tracking of the patella.  Patient does have positive grind test noted.   After informed written and verbal consent, patient was seated on exam table. Right knee was prepped with alcohol swab and utilizing anterolateral approach, patient's right knee space was injected with 4:1  marcaine 0.5%: Kenalog 40mg /dL. Patient tolerated the procedure well without immediate complications.  After informed written and verbal consent, patient was seated on exam table. Left knee was prepped with alcohol swab and utilizing anterolateral approach, patient's left knee space was injected with 4:1  marcaine 0.5%: Kenalog 40mg /dL. Patient tolerated the procedure well without immediate complications.   Impression and Recommendations:    The above documentation has been reviewed and is accurate and complete Judi Saa, DO

## 2023-02-15 ENCOUNTER — Ambulatory Visit (INDEPENDENT_AMBULATORY_CARE_PROVIDER_SITE_OTHER): Payer: Medicare Other | Admitting: Family Medicine

## 2023-02-15 ENCOUNTER — Encounter: Payer: Self-pay | Admitting: Family Medicine

## 2023-02-15 VITALS — BP 130/90 | HR 87 | Ht 62.0 in | Wt 189.0 lb

## 2023-02-15 DIAGNOSIS — M170 Bilateral primary osteoarthritis of knee: Secondary | ICD-10-CM

## 2023-02-15 NOTE — Assessment & Plan Note (Addendum)
Patient does have degenerative arthritic changes of the knees bilaterally.  Discussed icing regimen and home exercises, discussed which activities to do and which ones to avoid, increase activity slowly over the course of next several weeks.  Discussed icing regimen and home exercises.  Patient BMI is under 35 and would be a surgical candidate now if necessary.  Patient wants to continue with the injections if they do seem to continue to be beneficial.  Follow-up with me again in 3 months otherwise.

## 2023-02-15 NOTE — Patient Instructions (Signed)
See you again in 3 months Aquatic Therapy Referral

## 2023-02-17 ENCOUNTER — Ambulatory Visit: Payer: Medicare Other | Admitting: Family Medicine

## 2023-03-01 ENCOUNTER — Ambulatory Visit: Payer: Medicare Other

## 2023-03-01 NOTE — Progress Notes (Deleted)
Per orders of Dr. Neena Rhymes, injection of  High Dose Influenza  given by Dorris Fetch in {Left/right:33004} deltoid. Patient tolerated injection well.

## 2023-03-04 ENCOUNTER — Encounter (INDEPENDENT_AMBULATORY_CARE_PROVIDER_SITE_OTHER): Payer: Self-pay | Admitting: Otolaryngology

## 2023-03-04 ENCOUNTER — Ambulatory Visit (INDEPENDENT_AMBULATORY_CARE_PROVIDER_SITE_OTHER): Payer: Medicare Other | Admitting: Otolaryngology

## 2023-03-04 VITALS — BP 139/93 | HR 91 | Ht 62.0 in | Wt 189.4 lb

## 2023-03-04 DIAGNOSIS — G43C0 Periodic headache syndromes in child or adult, not intractable: Secondary | ICD-10-CM

## 2023-03-04 DIAGNOSIS — J3489 Other specified disorders of nose and nasal sinuses: Secondary | ICD-10-CM | POA: Diagnosis not present

## 2023-03-04 DIAGNOSIS — R0981 Nasal congestion: Secondary | ICD-10-CM

## 2023-03-04 DIAGNOSIS — R0982 Postnasal drip: Secondary | ICD-10-CM

## 2023-03-04 DIAGNOSIS — J342 Deviated nasal septum: Secondary | ICD-10-CM

## 2023-03-04 DIAGNOSIS — J343 Hypertrophy of nasal turbinates: Secondary | ICD-10-CM | POA: Diagnosis not present

## 2023-03-04 DIAGNOSIS — J3089 Other allergic rhinitis: Secondary | ICD-10-CM

## 2023-03-04 MED ORDER — CETIRIZINE HCL 10 MG PO TABS: 10.0000 mg | ORAL_TABLET | Freq: Every day | ORAL | 11 refills | Status: DC

## 2023-03-04 NOTE — Patient Instructions (Signed)
-   continue Zyrtec and Flonase - [facility] rinses with salt water - for headaches try taking over the counter Sudafed  - see Neurology for headache management

## 2023-03-04 NOTE — Progress Notes (Signed)
ENT Progress Note:  Update 03/04/23: She returns after completion of CT of the sinuses which essentially showed sinuses and no significant nasal obstruction.  She feels that her dizziness vertigo symptoms completely resolved following completion of vestibular rehab.  She was doing better on antihistamine and Flonase but with weather changes and rainy weather started having frontal headaches again.  Headaches are mild, but bothersome and respond to over-the-counter NSAIDs.  Never seen neurology for her headaches and thinks she had prior diagnosis of migraine headaches.  Initial consult 01/21/23   Reason for Consult: dizziness x 4 weeks/currently better and nasal congestion concern for sinus infection  HPI: Lynn [name] is an 72 y.o. female with hx of OA, DDD, lumbar spine problems, bilateral knee arthritis, seasonal and environmental allergies, on Zyrtec and Flonase, who is here for 4 weeks of dizziness/vertigo, which improved with vestibular rehab/PT and nasal congestion/facial pain and pressure.   Felt off balance and vertigo when she got up in the morning and it was seconds of dizziness/sx of feeling off balance. She also had some nausea with it and symptoms of nasal congestion and facial pain pressure concerning for sinus infection. Was seen by PCP, got a steroid shot and was referred to neurorehab for vestibular therapy.  PT notes indicate that she had negative Dix-Hallpike, she underwent vestibular therapy for balance strengthening.  In regards to her symptoms she feels significantly better , last episode of feeling off balance was 4 to 5 days ago .    She had prior allergy testing years ago and it was positive to dust, mold and cat dander mild reaction.  She has a cat in her home.  Currently on Zyrtec and Flonase. No sinus or nasal surgeries. She had sinus infections that required abx in the past, has been a while since she had 1. She had Meclizine given to her, has not needed it. No ear  surgeries. Able to walk and no longer has nausea. No headache or vision changes.    Records Reviewed:  PT Note by Nedra Hai 01/11/2023 Non-Vestibular symptoms: headaches, tinnitus, and nausea/vomiting             Type of dizziness: Imbalance (Disequilibrium), Unsteady with head/body turns, and "Swimmyheaded"             Frequency: nearly every time she gets up in the morning             Duration: about a min             Aggravating factors: Induced by position change: supine to sit and Worse in the morning             Relieving factors: rest and sitting still             Progression of symptoms: better  Office Note by  Zandra Abts NP 12/29/22 Seen for headache and dizziness, received steroid pack and  Administered Depo-Medrol 80mg  injection during appointment for headache and dizziness as patient requested to see if this will help with her symptoms as it has previously.  -Recommend to follow up with physical therapy tomorrow and ENT in August.   -Can continue taking Meclizine if needed for dizziness.  -Placed a referral to dermatology for history of lesions on face and needs a skin assessment    Past Medical History:  Diagnosis Date   Allergy    Arthritis    left knee   Basal cell carcinoma of skin 10/14/1994   Right breast  Basal cell carcinoma of skin 12/07/2011   Left inner elbow - TX p BX   Hyperlipidemia    Hypertension    Hypothyroidism    Squamous cell carcinoma of skin 12/07/2011   Left lower leg - TX p BX   Squamous cell carcinoma of skin 02/02/2017   Left anterior thigh - CX3 + 5FU    Past Surgical History:  Procedure Laterality Date   ABDOMINAL HYSTERECTOMY     c-section 1983     catract  Right 01/15/2022   CESAREAN SECTION  06/27/1981   COLONOSCOPY     EYE SURGERY  01/15/22   Cataract surgery right eye   KNEE ARTHROSCOPY     PARTIAL HYSTERECTOMY     POLYPECTOMY     TONSILLECTOMY     TUBAL LIGATION      Family History  Problem Relation Age of  Onset   Colon cancer Father    Cancer Father        skin   Colon polyps Sister    Colon polyps Sister    Colon polyps Sister    Colon polyps Sister    Cancer Maternal Grandfather    Cancer Paternal Grandfather        skin   Pancreatic cancer Neg Hx    Stomach cancer Neg Hx    Esophageal cancer Neg Hx    Rectal cancer Neg Hx     Social History:  reports that she has never smoked. She has never used smokeless tobacco. She reports current alcohol use. She reports that she does not use drugs.  Allergies:  Allergies  Allergen Reactions   Sulfa Antibiotics Nausea Only and Nausea And Vomiting   Brimonidine Tartrate     Medications: I have reviewed the patient's current medications.  The PMH, PSH, Medications, Allergies, and SH were reviewed and updated.  ROS: Constitutional: Negative for fever, weight loss and weight gain. Cardiovascular: Negative for chest pain and dyspnea on exertion. Respiratory: Is not experiencing shortness of breath at rest. Gastrointestinal: Negative for nausea and vomiting. Neurological: Negative for headaches. Psychiatric: The patient is not nervous/anxious  Blood pressure (!) 139/93, pulse 91, height 5\' 2"  (1.575 m), weight 189 lb 6.4 oz (85.9 kg), SpO2 96%.  PHYSICAL EXAM:  Exam: General: Well-developed, well-nourished Respiratory Respiratory effort: Equal inspiration and expiration without stridor Cardiovascular Peripheral Vascular: Warm extremities with equal color/perfusion Eyes: No nystagmus with equal extraocular motion bilaterally Neuro/Psych/Balance: Patient oriented to person, place, and time; Appropriate mood and affect; Gait is intact with no imbalance; Cranial nerves I-XII are intact Head and Face Inspection: Normocephalic and atraumatic without mass or lesion Facial Strength: Facial motility symmetric and full bilaterally ENT Pinna: External ear intact and fully developed Neck Neck and Trachea: Midline trachea without mass or  lesion   Studies Reviewed: CT max face done 03/04/2015 FINDINGS: Scattered beam hardening artifacts of dental origin.   Visualized intracranial contents unremarkable.   Orbital soft tissue planes clear.   Nasal septum midline.   Concha bullosa of the middle turbinates.   Paranasal sinuses, mastoid air cells and middle ear cavities clear bilaterally.   No sinus opacification, significant mucosal thickening or air-fluid levels.   No fracture or bone destruction.   IMPRESSION: No acute sinus abnormalities.  CT max face done 02/02/23 COMPARISON:  03/06/2015   FINDINGS: Paranasal sinuses:   Frontal: Normally aerated. Patent frontal sinus drainage pathways.   Ethmoid: Normally aerated.   Maxillary: Normally aerated.   Sphenoid: Normally aerated. Patent sphenoethmoidal recesses.  Right ostiomeatal unit: Crowded infundibulum from ethmoid bulla but patent outflow. Concha bullosa.   Left ostiomeatal unit: Patent despite concha bullosa.   Nasal passages: Patent. Intact nasal septum is midline.   Other: No incidental finding on soft tissue windows.   IMPRESSION: Normally aerated paranasal sinuses.  Patent sinus drainage pathways.   Patent concha bullosa.  Assessment/Plan: Encounter Diagnoses  Name Primary?   Periodic headache syndrome, not intractable Yes   Nasal obstruction    Nasal septal deviation    Nasal congestion    Environmental and seasonal allergies    Concha bullosa    Post-nasal drip    Hypertrophy of both inferior nasal turbinates     71 year old female with history of generative disc disease and osteoarthritis chronic lower back pain, history of environmental allergies, currently on Zyrtec and Flonase, prior allergy testing positive to allergies for cat dander, dust mites, molds, who is here for an episode of intermittent feeling of dizziness off-balance sensation triggered by body movement lasting for minutes.  Received steroid shot by PCP, and  initially required meclizine however, after completing vestibular rehab for balance strengthening exercises, her dizziness balance problem is better.  Records indicate that she had negative Dix-Hallpike maneuver with PT. I suspect that her balance problems are likely related to history of chronic back pain and stiffness.  She also reports nasal congestion symptoms of postnasal drainage and nasal obstruction, no recent imaging of her sinuses.  I reviewed max face CT done back in 2016 and it shows patent paranasal sinuses and evidence of bilateral concha bullosa with minimal septal deviation and inferior turbinate hypertrophy.  Nasal endoscopy today did not show any evidence of polyps or purulence, but her nasal passages were somewhat narrow and there is evidence of mucosal edema and clear secretions.  Nasopharynx was free of masses or lesions or purulence.  I discussed with the patient common causes of dizziness and vertigo, and since her dizziness and balance problems have resolved at this time, we will hold off on additional workup.  If symptoms return or she has another bout of symptoms in the future, I advised her to seek additional workup and testing including vestibular testing, and evaluation by neurology and cardiology.  To better assess her nasal congestion, will order CT sinuses, to rule out chronic sinus inflammation.  If negative for chronic sinusitis, we will focus on medical management of nasal congestion and allergies.   - continue Flonase and Zyrtec daily - schedule CT sinuses  - continue vestibular rehab - if sx worsen, see PCP to get referred to Neurology/Cards  - return to see [place] after CT sinuses   Update 03/04/23 She reports some sx improvement, but today with rainy weather, had headache again. She states that dizziness symptoms completely resolved after completion of vestibular rehab.  Reports prior history of migraine headaches, never evaluated by neurology  Nasal congestion/inferior  turban hypertrophy/concha bullosa/nasal obstruction -Continue medical management with daily Flonase 2 puffs b/l BID and Zyrtec 10 mg daily (rx sent today) - try nasal saline rinses   2. Frontal headache hx of migraines -I discussed with the patient at length today that her symptoms could be related to undiagnosed migraine headache versus either sinusitis symptoms when atmospheric pressure changes with weather such as rainy weather - try OTC Sudafed when having a headache  -Management of nasal congestion as above -Will refer her to neurology for evaluation and further recommendations  3. Dizziness and balance problems - resolved following vestibular rehab  Thank  you for allowing me to participate in the care of this patient. Please do not hesitate to contact me with any questions or concerns.   Ashok Croon, MD Otolaryngology [facility] Phone: [phone] Fax: [phone]    03/04/2023, 4:06 PM

## 2023-03-05 ENCOUNTER — Ambulatory Visit: Payer: Medicare Other

## 2023-03-09 ENCOUNTER — Ambulatory Visit (INDEPENDENT_AMBULATORY_CARE_PROVIDER_SITE_OTHER): Payer: Medicare Other

## 2023-03-09 DIAGNOSIS — Z23 Encounter for immunization: Secondary | ICD-10-CM

## 2023-03-09 NOTE — Progress Notes (Signed)
Lynn [name] is a 72 y.o. female presents to the office today for High Dose FLU injections, per physician's orders. Fluad  (med), 0.47mL (dose),  IM (route) was administered Lt Deltoid  (location) today. Patient tolerated injection.  Lynn [name] K Ronn Smolinsky

## 2023-03-11 ENCOUNTER — Other Ambulatory Visit: Payer: Self-pay | Admitting: Family Medicine

## 2023-03-12 ENCOUNTER — Ambulatory Visit (HOSPITAL_BASED_OUTPATIENT_CLINIC_OR_DEPARTMENT_OTHER)
Admission: RE | Admit: 2023-03-12 | Discharge: 2023-03-12 | Disposition: A | Discharge status: Home / Self Care | Payer: Medicare Other | Source: Ambulatory Visit | Attending: Family Medicine | Admitting: Family Medicine

## 2023-03-12 DIAGNOSIS — Z1231 Encounter for screening mammogram for malignant neoplasm of breast: Secondary | ICD-10-CM | POA: Diagnosis not present

## 2023-03-29 DIAGNOSIS — L538 Other specified erythematous conditions: Secondary | ICD-10-CM | POA: Diagnosis not present

## 2023-03-29 DIAGNOSIS — L570 Actinic keratosis: Secondary | ICD-10-CM | POA: Diagnosis not present

## 2023-03-29 DIAGNOSIS — L820 Inflamed seborrheic keratosis: Secondary | ICD-10-CM | POA: Diagnosis not present

## 2023-03-29 DIAGNOSIS — L2989 Other pruritus: Secondary | ICD-10-CM | POA: Diagnosis not present

## 2023-03-29 DIAGNOSIS — Z789 Other specified health status: Secondary | ICD-10-CM | POA: Diagnosis not present

## 2023-04-15 ENCOUNTER — Other Ambulatory Visit: Payer: Self-pay

## 2023-04-15 ENCOUNTER — Encounter (HOSPITAL_BASED_OUTPATIENT_CLINIC_OR_DEPARTMENT_OTHER): Payer: Self-pay | Admitting: Physical Therapy

## 2023-04-15 ENCOUNTER — Ambulatory Visit (HOSPITAL_BASED_OUTPATIENT_CLINIC_OR_DEPARTMENT_OTHER): Payer: Medicare Other | Attending: Family Medicine | Admitting: Physical Therapy

## 2023-04-15 DIAGNOSIS — M25562 Pain in left knee: Secondary | ICD-10-CM | POA: Diagnosis not present

## 2023-04-15 DIAGNOSIS — G8929 Other chronic pain: Secondary | ICD-10-CM | POA: Insufficient documentation

## 2023-04-15 DIAGNOSIS — M170 Bilateral primary osteoarthritis of knee: Secondary | ICD-10-CM | POA: Diagnosis not present

## 2023-04-15 DIAGNOSIS — M25561 Pain in right knee: Secondary | ICD-10-CM | POA: Insufficient documentation

## 2023-04-15 DIAGNOSIS — M6281 Muscle weakness (generalized): Secondary | ICD-10-CM | POA: Insufficient documentation

## 2023-04-15 NOTE — Therapy (Addendum)
 OUTPATIENT PHYSICAL THERAPY LOWER EXTREMITY EVALUATION PHYSICAL THERAPY DISCHARGE SUMMARY  Visits from Start of Care: 1  Current functional level related to goals / functional outcomes: unknown   Remaining deficits: unknown   Education / Equipment: Initial eval edu   Patient agrees to discharge. Patient goals were not met. Patient is being discharged due to not returning since the last visit.  Addend Adriana Hopping [facility]) Ellesse Antenucci MPT 06/23/23 12:53 PM [facility] [address] [place], [place], [address] Phone: [phone]   Fax:  [phone]    Patient Name: Lynn [name] MRN: [id number] DOB:[birth year], 72 y.o., female Today's Date: 04/15/2023  END OF SESSION:  PT End of Session - 04/15/23 1209     Visit Number 1    Date for PT Re-Evaluation 05/21/23    Authorization Type Mcr    PT Start Time 1202    PT Stop Time 1240    PT Time Calculation (min) 38 min    Activity Tolerance Patient tolerated treatment well    Behavior During Therapy [facility] for tasks assessed/performed             Past Medical History:  Diagnosis Date   Allergy    Arthritis    left knee   Basal cell carcinoma of skin 10/14/1994   Right breast   Basal cell carcinoma of skin 12/07/2011   Left inner elbow - TX p BX   Hyperlipidemia    Hypertension    Hypothyroidism    Squamous cell carcinoma of skin 12/07/2011   Left lower leg - TX p BX   Squamous cell carcinoma of skin 02/02/2017   Left anterior thigh - CX3 + 5FU   Past Surgical History:  Procedure Laterality Date   ABDOMINAL HYSTERECTOMY     c-section 1983     catract  Right 01/15/2022   CESAREAN SECTION  06/27/1981   COLONOSCOPY     EYE SURGERY  01/15/22   Cataract surgery right eye   KNEE ARTHROSCOPY     PARTIAL HYSTERECTOMY     POLYPECTOMY     TONSILLECTOMY     TUBAL LIGATION     Patient Active Problem List   Diagnosis Date Noted   Degenerative disc disease, lumbar  08/24/2022   Combined forms of age-related cataract of right eye 01/14/2022   Regular astigmatism of right eye 01/14/2022   Lateral subluxation of right patella 08/15/2019   Pain of right hip joint 05/21/2018   Degenerative arthritis of knee, bilateral 10/18/2017   Elevated BP without diagnosis of hypertension 11/05/2016   Physical exam 04/22/2016   Hypothyroidism 10/24/2015   Hyperlipidemia 10/24/2015   Obesity (BMI 35.0-39.9 without comorbidity) 10/24/2015    PCP: Laymon Priest MD  REFERRING PROVIDER: Isidro Margo, DO   REFERRING DIAG: Primary osteoarthritis of both knees   THERAPY DIAG:  Chronic pain of both knees  Muscle weakness (generalized)  Rationale for Evaluation and Treatment: Rehabilitation  ONSET DATE: 3-4 years  SUBJECTIVE:   SUBJECTIVE STATEMENT: I have been getting injections in both knee for past couple of years.  I walk about 15 mins every other day consistently. No limitation with transfers, walking through grocery stores.  Stairs climbing is limited..  Occasional waking at night if sleeping in wrong position  PERTINENT HISTORY: Lynn [name] is a 72 y.o. female coming in with complaint of B knee pain.  Known arthritic changes.  Was having worsening pain.  Last injections were nearly 4 months ago.  Patient states  continues to have some difficulty as well.  Patient also states would like injections in knees. Real painful this last month. Would like aquatic therapy.  PAIN:  Are you having pain? Yes: NPRS scale: current 6/10;  avg pain 2/10; worst 6-7/10/10 Pain location: bilateral joint lines Pain description: sore Aggravating factors: stairs, sleeping in wrong position  Relieving factors: resting, steroid injections  PRECAUTIONS: None  RED FLAGS: None   WEIGHT BEARING RESTRICTIONS: No  FALLS:  Has patient fallen in last 6 months? No  LIVING ENVIRONMENT: Lives with: lives alone Lives in: House/apartment Stairs: Yes: Internal: 15  steps; on left going up and External: 6 steps; can reach both (inside steps to basement)  OCCUPATION: retired  PLOF: Independent  PATIENT GOALS: more flexibility, less pain with movement, not thinking about it.  NEXT MD VISIT: 3 months  OBJECTIVE:  Note: Objective measures were completed at Evaluation unless otherwise noted.  DIAGNOSTIC FINDINGS:   Lumbar MRI: IMPRESSION: 1. Multilevel degenerative disc disease and facet hypertrophy in the lumbar spine, most prominent at L4-L5 where there is 7 mm anterolisthesis. 2. Mild dextroscoliotic curvature.  PATIENT SURVEYS:  FOTO Primary measure 65% with goal of 70%  COGNITION: Overall cognitive status: Within functional limits for tasks assessed     SENSATION: WFL  EDEMA:  none MUSCLE LENGTH: Hamstrings: testing in sitting: R hamstring tighter than L but wfl   POSTURE: Slight valgus deformities bilaterally knee  PALPATION: Slight TTP about bilat knee joint lines  LOWER EXTREMITY ROM:  Active ROM Right eval Left eval  Hip flexion    Hip extension    Hip abduction    Hip adduction    Hip internal rotation    Hip external rotation    Knee flexion 120 120  Knee extension 0 0  Ankle dorsiflexion    Ankle plantarflexion    Ankle inversion    Ankle eversion     (Blank rows = not tested)  LOWER EXTREMITY MMT:  MMT Right eval Left eval  Hip flexion 47.4 44.4  Hip extension    Hip abduction 29.7 25.6  Hip adduction    Hip internal rotation    Hip external rotation    Knee flexion 42.6 43.7  Knee extension 43.0 36.3  Ankle dorsiflexion    Ankle plantarflexion    Ankle inversion    Ankle eversion     (Blank rows = not tested)  LOWER EXTREMITY SPECIAL TESTS:  Knee special tests: Patellafemoral apprehension test: negative  FUNCTIONAL TESTS:  5 times sit to stand: 11.52 Timed up and go (TUG): 11.82 4 stage: 1&2 passed tandem x 10s; sls x 5  GAIT: Distance walked: 500 ft Assistive device utilized:  None Level of assistance: Complete Independence Comments: slight off loading R and Left   TODAY'S TREATMENT:  Eval Testing Pt edu   PATIENT EDUCATION:  Education details: Discussed eval findings, rehab rationale, aquatic program progression/POC and pools in area. Patient is in agreement  Person educated: Patient Education method: Explanation Education comprehension: verbalized understanding  HOME EXERCISE PROGRAM: Aquatic to be assigned  ASSESSMENT:  CLINICAL IMPRESSION: Patient is a 72 y.o. F who was seen today for physical therapy evaluation and treatment for bilateral knee OA.   She presents with complaints of moderate knee pain daily but with subjective history taken she is very minimally limited by knee dysfunction.  She does get steroid injections every 3-4 months which she has been for the last few years which has been managing her pain.  She is an active senior walking  3-4 x weekly x 15 minutes and travels frequently with her sisters.  She has normal ROM in both knees and minor strength deficits. Patellofemoral apprehension test neg. She will benefit from aquatic based intervention for a few sessions to instruct her on the benefits of aquatic therapy and how to use the properties of water for strengthening, ROM, balance retraining, aerobic capacity training as well as pain management.  Will decide on transitioning to land based intervention at [facility]  OBJECTIVE IMPAIRMENTS: Abnormal gait, decreased balance, decreased strength, and pain.   REHAB POTENTIAL: Good  CLINICAL DECISION MAKING: Stable/uncomplicated  EVALUATION COMPLEXITY: Low   GOALS: Goals reviewed with patient? Yes  SHORT TERM GOALS: Target date: 05/21/23 Pt will tolerate full aquatic sessions consistently without increase in pain and with improving function to demonstrate good toleration  and effectiveness of intervention.  Baseline: Goal status: INITIAL  2.  Pt to meet stated Foto Goal of 70% Baseline: 65% Goal status: INITIAL  3.  Pt will be indep with final HEP's aquatic for continued management of condition Baseline:  Goal status: INITIAL  4.  Pt will decide on value of aquatic intervention and if she desires to continue with she will be directed towards area pools that she may chose to access. Baseline: undecided Goal status: INITIAL  5.  Pt will demonstrate full hamstring and gastroc length through stretching to meet personal goal. Baseline: Pt reports feeling tight Goal status: INITIAL  6.  Pt will pass tandem and SLS in 4 stage balance test (stages 3&4) holding positions on land x 20s to demonstrate improved Le strength and balance ability Baseline: see chart Goal status: INITIAL  LONG TERM GOALS: To be added at EOC as approp   PLAN:  PT FREQUENCY: 1-2x/week  PT DURATION: 5 week.  Likely 4 visits.  PLANNED INTERVENTIONS: 97164- PT Re-evaluation, 97110-Therapeutic exercises, 97530- Therapeutic activity, [id number]- Neuromuscular re-education, 97535- Self Care, 40981- Manual therapy, [phone]- Gait training, [phone]- Orthotic Fit/training, [id number]- Aquatic Therapy, [phone]- Splinting, 97014- Electrical stimulation (unattended), 97035- Ultrasound, 69629- Ionotophoresis 4mg /ml Dexamethasone, Patient/Family education, Balance training, Stair training, Taping, Dry Needling, Joint mobilization, DME instructions, Cryotherapy, Moist heat, and Biofeedback  PLAN FOR NEXT SESSION: Aquatic only: Instruction on le and core strengthening, balance retraining, assign HEP, instruct on benefits of aquatic therapy and the use of properties of water   Frankie Maya Scholer, PT 04/15/2023, 1:37 PM

## 2023-04-22 ENCOUNTER — Ambulatory Visit (HOSPITAL_BASED_OUTPATIENT_CLINIC_OR_DEPARTMENT_OTHER): Payer: Self-pay | Admitting: Physical Therapy

## 2023-04-28 ENCOUNTER — Ambulatory Visit (HOSPITAL_BASED_OUTPATIENT_CLINIC_OR_DEPARTMENT_OTHER): Payer: Self-pay | Admitting: Physical Therapy

## 2023-05-03 ENCOUNTER — Ambulatory Visit (HOSPITAL_BASED_OUTPATIENT_CLINIC_OR_DEPARTMENT_OTHER): Payer: Self-pay | Admitting: Physical Therapy

## 2023-05-04 ENCOUNTER — Ambulatory Visit (HOSPITAL_BASED_OUTPATIENT_CLINIC_OR_DEPARTMENT_OTHER): Payer: Medicare Other | Admitting: Physical Therapy

## 2023-05-12 ENCOUNTER — Ambulatory Visit (HOSPITAL_BASED_OUTPATIENT_CLINIC_OR_DEPARTMENT_OTHER): Payer: Medicare Other | Admitting: Physical Therapy

## 2023-05-12 ENCOUNTER — Encounter (HOSPITAL_BASED_OUTPATIENT_CLINIC_OR_DEPARTMENT_OTHER): Payer: Self-pay

## 2023-05-13 NOTE — Progress Notes (Signed)
[name] [address] [place] [address] Phone: [phone] Subjective:   [name], am serving as a scribe for Dr. Antoine Primas.  I'm seeing this patient by the request  of:  Sheliah Hatch, MD  CC: Bilateral knee pain  SEG:[id number]  02/15/2023 Patient does have degenerative arthritic changes of the knees bilaterally. Discussed icing regimen and home exercises, discussed which activities to do and which ones to avoid, increase activity slowly over the course of next several weeks. Discussed icing regimen and home exercises. Patient BMI is under 35 and would be a surgical candidate now if necessary. Patient wants to continue with the injections if they do seem to continue to be beneficial. Follow-up with me again in 3 months otherwise.   Updated 05/18/2023 Lynn [name] is a 72 y.o. female coming in with complaint of B knee pain. Patient was doing well until going to a game in [place].  Patient states that when she started to have to climb stairs seem to have some more aggravation.      Past Medical History:  Diagnosis Date   Allergy    Arthritis    left knee   Basal cell carcinoma of skin 10/14/1994   Right breast   Basal cell carcinoma of skin 12/07/2011   Left inner elbow - TX p BX   Hyperlipidemia    Hypertension    Hypothyroidism    Squamous cell carcinoma of skin 12/07/2011   Left lower leg - TX p BX   Squamous cell carcinoma of skin 02/02/2017   Left anterior thigh - CX3 + 5FU   Past Surgical History:  Procedure Laterality Date   ABDOMINAL HYSTERECTOMY     c-section 1983     catract  Right 01/15/2022   CESAREAN SECTION  06/27/1981   COLONOSCOPY     EYE SURGERY  01/15/22   Cataract surgery right eye   KNEE ARTHROSCOPY     PARTIAL HYSTERECTOMY     POLYPECTOMY     TONSILLECTOMY     TUBAL LIGATION     Social History   Socioeconomic History   Marital status: Widowed    Spouse name: Not on file    Number of children: Not on file   Years of education: Not on file   Highest education level: Associate degree: occupational, Scientist, product/process development, or vocational program  Occupational History   Occupation: retired  Tobacco Use   Smoking status: Never   Smokeless tobacco: Never   Tobacco comments:    Do not and never have smoked  Vaping Use   Vaping status: Never Used  Substance and Sexual Activity   Alcohol use: Yes   Drug use: No   Sexual activity: Not Currently    Birth control/protection: Post-menopausal  Other Topics Concern   Not on file  Social History Narrative   Not on file   Social Determinants of Health   Financial Resource Strain: Low Risk  (02/10/2023)   Overall Financial Resource Strain (CARDIA)    Difficulty of Paying Living Expenses: Not hard at all  Food Insecurity: No Food Insecurity (02/10/2023)   Hunger Vital Sign    Worried About Running Out of Food in the Last Year: Never true    Ran Out of Food in the Last Year: Never true  Transportation Needs: No Transportation Needs (02/10/2023)   PRAPARE - Administrator, Civil Service (Medical): No    Lack of Transportation (Non-Medical): No  Physical Activity: Insufficiently Active (02/10/2023)   Exercise Vital Sign    Days of Exercise per Week: 1 day    Minutes of Exercise per Session: 20 min  Stress: No Stress Concern Present (02/10/2023)   [employer] of Occupational Health - Occupational Stress Questionnaire    Feeling of Stress : Only a little  Social Connections: Moderately Isolated (02/10/2023)   Social Connection and Isolation Panel [NHANES]    Frequency of Communication with Friends and Family: More than three times a week    Frequency of Social Gatherings with Friends and Family: Once a week    Attends Religious Services: Never    Database administrator or Organizations: Yes    Attends Banker Meetings: 1 to 4 times per year    Marital Status: Widowed   Allergies  Allergen Reactions    Sulfa Antibiotics Nausea Only and Nausea And Vomiting   Brimonidine Tartrate    Family History  Problem Relation Age of Onset   Colon cancer Father    Cancer Father        skin   Colon polyps Sister    Colon polyps Sister    Colon polyps Sister    Colon polyps Sister    Cancer Maternal Grandfather    Cancer Paternal Grandfather        skin   Pancreatic cancer Neg Hx    Stomach cancer Neg Hx    Esophageal cancer Neg Hx    Rectal cancer Neg Hx     Current Outpatient Medications (Endocrine & Metabolic):    levothyroxine (SYNTHROID) 100 MCG tablet, TAKE 1 TABLET BY MOUTH EVERY DAY  Current Outpatient Medications (Cardiovascular):    atorvastatin (LIPITOR) 20 MG tablet, TAKE 1 TABLET BY MOUTH EVERY DAY   furosemide (LASIX) 20 MG tablet, TAKE 1 TABLET BY MOUTH EVERY DAY  Current Outpatient Medications (Respiratory):    cetirizine (ZYRTEC) 10 MG tablet, Take 1 tablet (10 mg total) by mouth daily.   fluticasone (FLONASE) 50 MCG/ACT nasal spray, Place 2 sprays into both nostrils daily.   promethazine (PHENERGAN) 25 MG tablet, TAKE 1 TABLET BY MOUTH EVERY 6 HOURS AS NEEDED FOR NAUSEA OR VOMITING.    Current Outpatient Medications (Other):    gabapentin (NEURONTIN) 100 MG capsule, TAKE 2 CAPSULES BY MOUTH 3 TIMES DAILY AS NEEDED.   Multiple Vitamin (MULTIVITAMIN) tablet, Take 1 tablet by mouth daily.   Probiotic Product (PROBIOTIC-10) CHEW, Chew by mouth.   Reviewed prior external information including notes and imaging from  primary care provider As well as notes that were available from care everywhere and other healthcare systems.  Past medical history, social, surgical and family history all reviewed in electronic medical record.  No pertanent information unless stated regarding to the chief complaint.   Review of Systems:  No headache, visual changes, nausea, vomiting, diarrhea, constipation, dizziness, abdominal pain, skin rash, fevers, chills, night sweats, weight loss,  swollen lymph nodes, body aches, joint swelling, chest pain, shortness of breath, mood changes. POSITIVE muscle aches  Objective  Blood pressure (!) 122/98, pulse (!) 56, height 5\' 2"  (1.575 m), weight 191 lb (86.6 kg), SpO2 98%.   General: No apparent distress alert and oriented x3 mood and affect normal, dressed appropriately.  HEENT: Pupils equal, extraocular movements intact  Respiratory: Patient's speak in full sentences and does not appear short of breath  Cardiovascular: No lower extremity edema, non tender, no erythema  Bilateral knees trace effusion noted.  Varus deformity noted.  Instability with valgus and varus force noted.  Patient does have trace effusion noted of the patellofemoral joint bilaterally  After informed written and verbal consent, patient was seated on exam table. Right knee was prepped with alcohol swab and utilizing anterolateral approach, patient's right knee space was injected with 4:1  marcaine 0.5%: Kenalog 40mg /dL. Patient tolerated the procedure well without immediate complications.  After informed written and verbal consent, patient was seated on exam table. Left knee was prepped with alcohol swab and utilizing anterolateral approach, patient's left knee space was injected with 4:1  marcaine 0.5%: Kenalog 40mg /dL. Patient tolerated the procedure well without immediate complications.    Impression and Recommendations:     The above documentation has been reviewed and is accurate and complete Judi Saa, DO

## 2023-05-18 ENCOUNTER — Ambulatory Visit (INDEPENDENT_AMBULATORY_CARE_PROVIDER_SITE_OTHER): Payer: Medicare Other | Admitting: Family Medicine

## 2023-05-18 ENCOUNTER — Encounter: Payer: Self-pay | Admitting: Family Medicine

## 2023-05-18 VITALS — BP 122/98 | HR 56 | Ht 62.0 in | Wt 191.0 lb

## 2023-05-18 DIAGNOSIS — M170 Bilateral primary osteoarthritis of knee: Secondary | ICD-10-CM

## 2023-05-18 NOTE — Assessment & Plan Note (Signed)
Chronic problem with exacerbation.  Discussed icing regimen and home exercises, discussed which activities to do and which ones to avoid.  Increase activity slowly.  Continue to work on weight loss but patient has done very well and BMI is now under 35.  Would be a surgical candidate if she needed it but hopefully will not be necessary.  Follow-up with me again in 3 months.

## 2023-05-19 ENCOUNTER — Ambulatory Visit (HOSPITAL_BASED_OUTPATIENT_CLINIC_OR_DEPARTMENT_OTHER): Payer: Medicare Other | Admitting: Physical Therapy

## 2023-05-21 IMAGING — MG MM DIGITAL SCREENING BILAT W/ TOMO AND CAD
6 of 12 series · 6 of 36 positions shown · non-contrast
Comparison: Previous exam(s).

ACR Breast Density Category a: The breast tissue is almost entirely
fatty.

CLINICAL DATA: Screening.

EXAM:
DIGITAL SCREENING BILATERAL MAMMOGRAM WITH TOMOSYNTHESIS AND CAD
TECHNIQUE: Bilateral screening digital craniocaudal and mediolateral oblique
mammograms were obtained. Bilateral screening digital breast
tomosynthesis was performed. The images were evaluated with
computer-aided detection.

[R CC synth-2D]
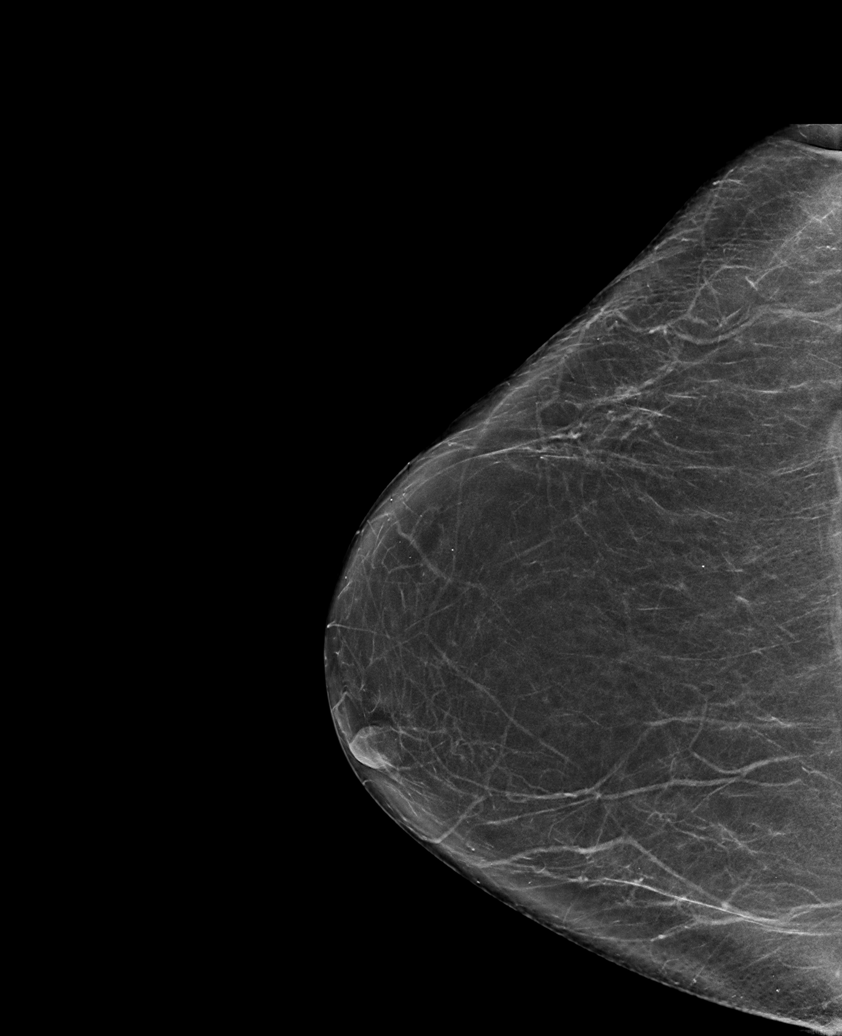

[R CV synth-2D]
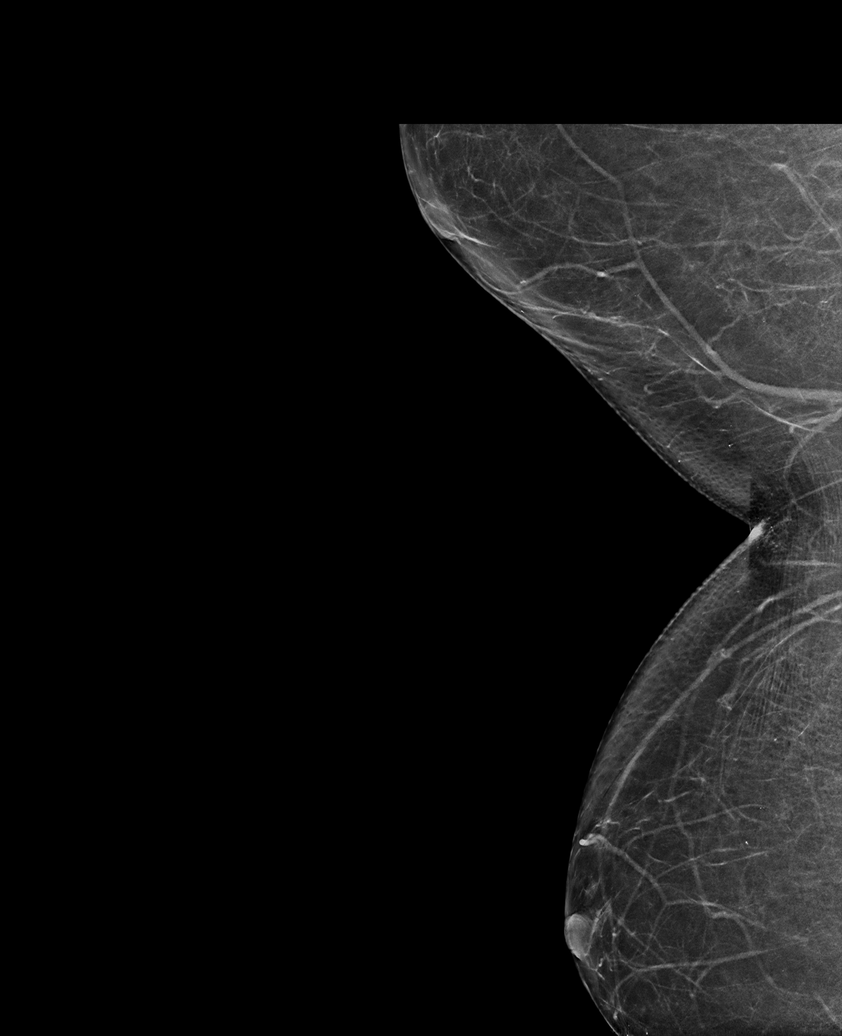

[L MLO synth-2D (1 of 2)]
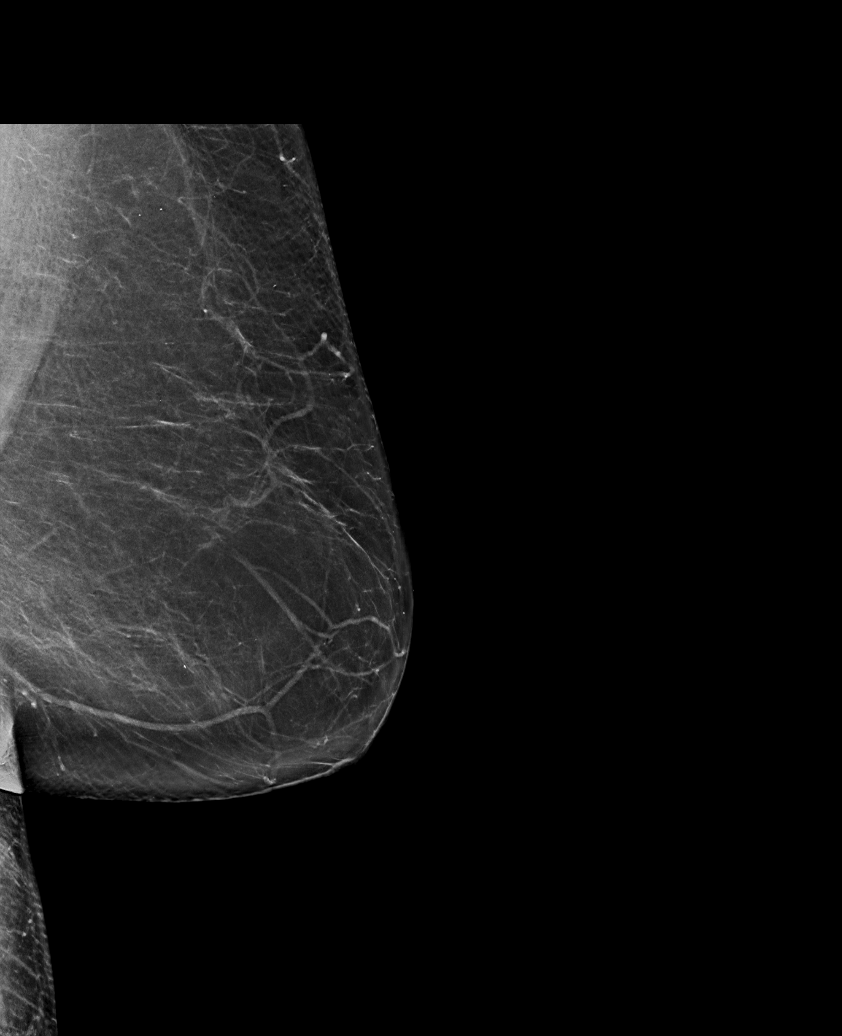

[L CC synth-2D]
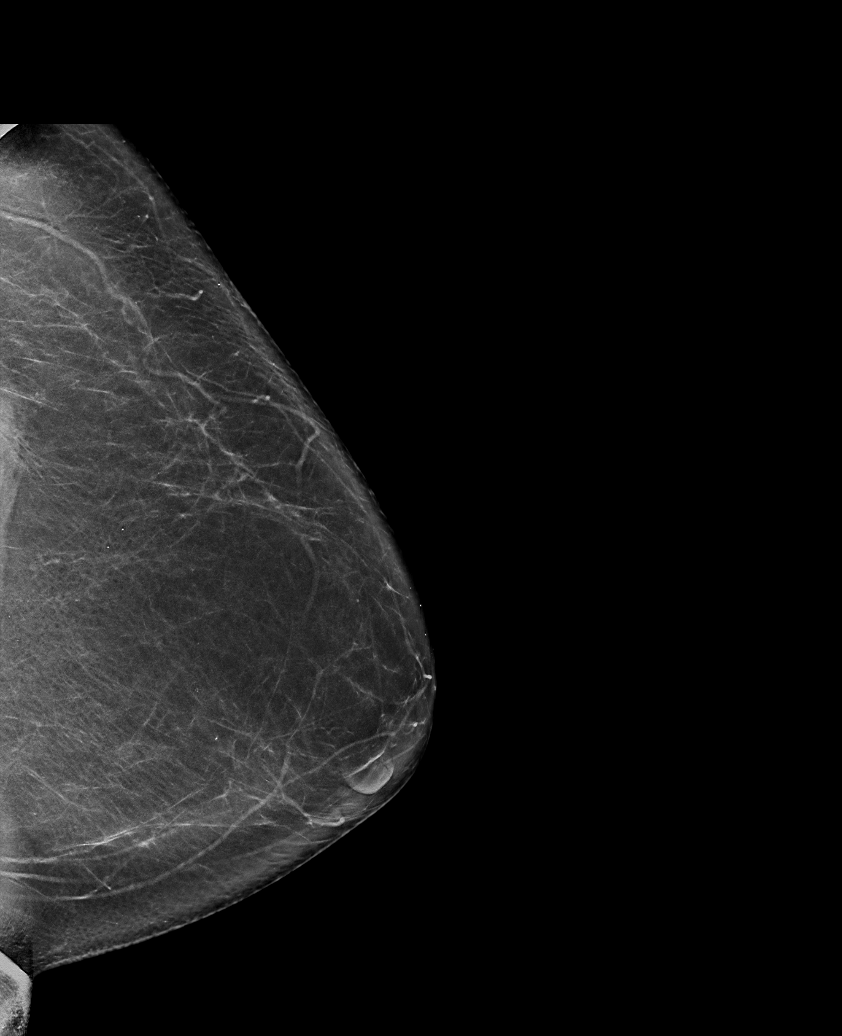

[L MLO synth-2D (2 of 2)]
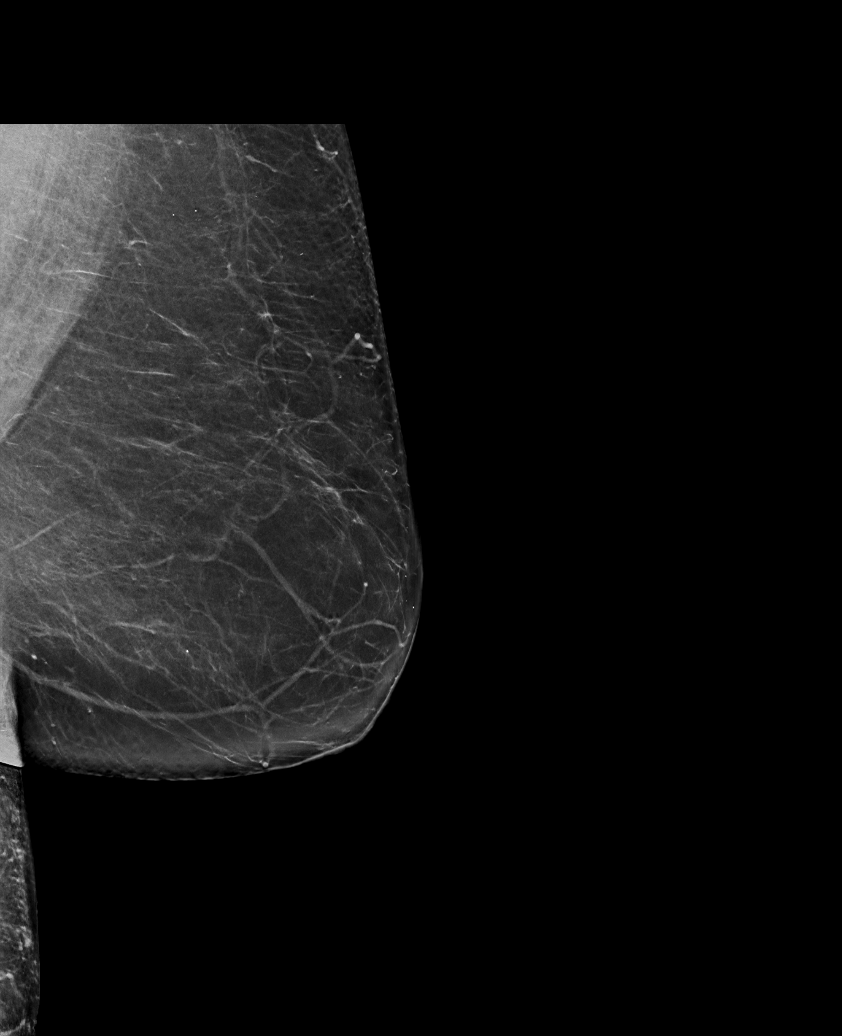

[R MLO synth-2D]
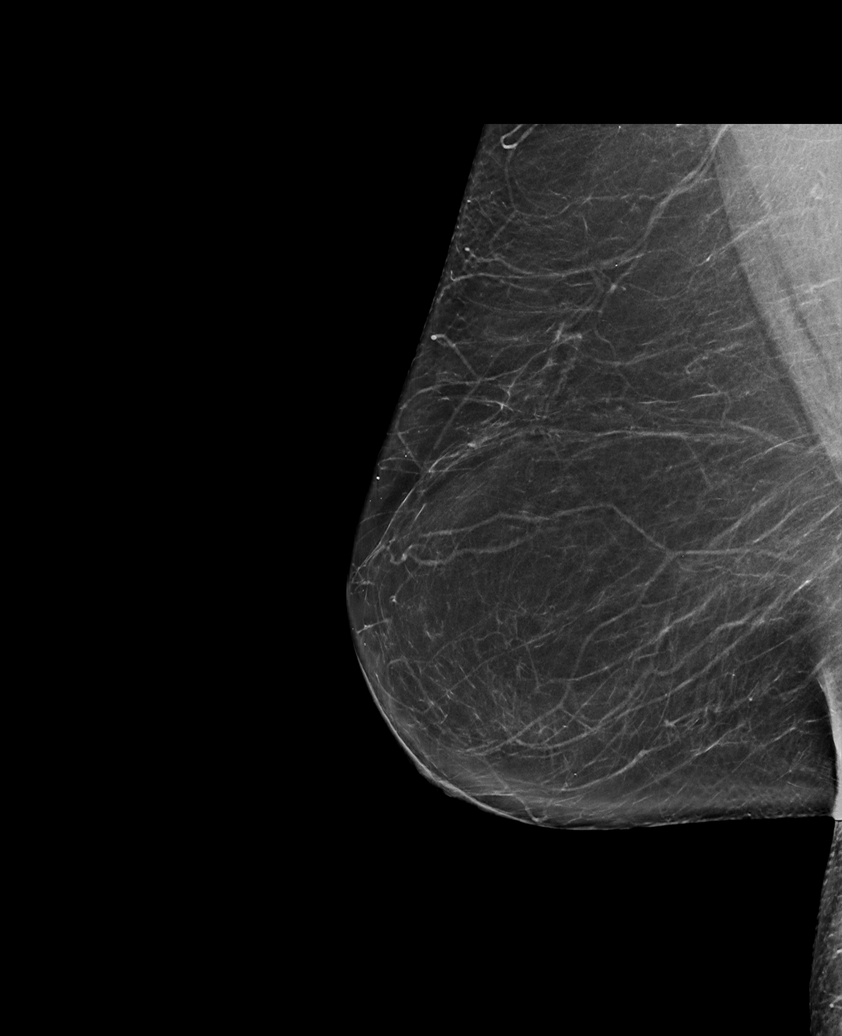

[6 of 36 positions shown; findings below may reference images not displayed]

FINDINGS: There are no findings suspicious for malignancy.
IMPRESSION: No mammographic evidence of malignancy. A result letter of this
screening mammogram will be mailed directly to the patient.

RECOMMENDATION:
Screening mammogram in one year. (Code:0E-3-N98)

BI-RADS CATEGORY  1: Negative.

## 2023-05-27 ENCOUNTER — Ambulatory Visit (HOSPITAL_BASED_OUTPATIENT_CLINIC_OR_DEPARTMENT_OTHER): Payer: Medicare Other | Admitting: Physical Therapy

## 2023-06-06 ENCOUNTER — Other Ambulatory Visit: Payer: Self-pay | Admitting: Family Medicine

## 2023-06-15 ENCOUNTER — Ambulatory Visit (HOSPITAL_BASED_OUTPATIENT_CLINIC_OR_DEPARTMENT_OTHER): Payer: Medicare Other | Admitting: Physical Therapy

## 2023-07-10 ENCOUNTER — Other Ambulatory Visit: Payer: Self-pay | Admitting: Family Medicine

## 2023-07-12 ENCOUNTER — Ambulatory Visit: Payer: Medicare Other | Admitting: Family Medicine

## 2023-07-13 ENCOUNTER — Telehealth: Payer: Self-pay | Admitting: Family Medicine

## 2023-07-13 NOTE — Telephone Encounter (Signed)
Called pt she stated that she thought had sinus issues but she felt better and canceled her appt.

## 2023-07-16 ENCOUNTER — Telehealth: Payer: Medicare Other | Admitting: Physician Assistant

## 2023-07-16 DIAGNOSIS — B9689 Other specified bacterial agents as the cause of diseases classified elsewhere: Secondary | ICD-10-CM

## 2023-07-16 DIAGNOSIS — J019 Acute sinusitis, unspecified: Secondary | ICD-10-CM

## 2023-07-16 MED ORDER — AMOXICILLIN-POT CLAVULANATE 875-125 MG PO TABS: 1.0000 | ORAL_TABLET | Freq: Two times a day (BID) | ORAL | 0 refills | Status: DC

## 2023-07-16 NOTE — Progress Notes (Signed)

## 2023-07-29 ENCOUNTER — Ambulatory Visit: Payer: Medicare Other | Admitting: Dermatology

## 2023-08-09 ENCOUNTER — Other Ambulatory Visit: Payer: Self-pay | Admitting: Family Medicine

## 2023-08-09 DIAGNOSIS — R42 Dizziness and giddiness: Secondary | ICD-10-CM

## 2023-08-12 ENCOUNTER — Other Ambulatory Visit: Payer: Self-pay | Admitting: Family Medicine

## 2023-08-17 ENCOUNTER — Ambulatory Visit: Payer: Medicare Other | Admitting: Family Medicine

## 2023-08-24 ENCOUNTER — Encounter: Payer: Self-pay | Admitting: Dermatology

## 2023-08-24 ENCOUNTER — Ambulatory Visit (INDEPENDENT_AMBULATORY_CARE_PROVIDER_SITE_OTHER): Payer: Medicare Other | Admitting: Dermatology

## 2023-08-24 VITALS — BP 131/82 | HR 89

## 2023-08-24 DIAGNOSIS — Z5111 Encounter for antineoplastic chemotherapy: Secondary | ICD-10-CM

## 2023-08-24 DIAGNOSIS — L570 Actinic keratosis: Secondary | ICD-10-CM | POA: Diagnosis not present

## 2023-08-24 DIAGNOSIS — W908XXA Exposure to other nonionizing radiation, initial encounter: Secondary | ICD-10-CM | POA: Diagnosis not present

## 2023-08-24 MED ORDER — FLUOROURACIL 5 % EX CREA: TOPICAL_CREAM | Freq: Two times a day (BID) | CUTANEOUS | 1 refills | Status: AC

## 2023-08-24 NOTE — Patient Instructions (Addendum)
 Efudex Instructions 1. Apply a thin layer of Efudex cream to the specified sun  damaged area twice a day. The medication is to be applied to the entire  area, not just the keratoses (rough spots). Wash hands thoroughly after  application! 2. Use Efudex twice daily for 2 weeks to 4 weeks (depending on the  physician directions) or once a day for 4 weeks (depending on the  physician directions).  3. The skin will become red and scaly. This is the appropriate response.  4. Once redness starts, you may begin using Hydrocortisone 1% Cream (or  other topical steroid provided by the physician) twice a day. This helps  relieve pain and will speed the healing process. It may be applied as soon  as 30 minutes after Efudex application. 5. For excessive reactions or if suspicious for an infection or allergic  reaction, please call to make an appointment at my office so that I may  evaluate your response to treatment. 6. The area may be washed as you normally would with soap and water. Do  not wash the face for at least three hours after Efudex  application. 7. Ladies: Chrystie Nose is permitted throughout the entire treatment period. Wait  30 minutes after Efudex use before applying makeup directly on top of the  medication. 8. Please read the instructional pamphlet provided by the [employer] so that you may familiarize yourself with the kinds of reactions  you may experience. 9. If you have had a biopsy on any area that you will use the medication on,  DO NOT start the Efudex until your results are given to you and  the medical assistant lets you know it is okay to start the treatment   Skin Education : We counseled the patient regarding the following: Sun screen (SPF 30 or greater) should be applied during peak UV exposure (between 10am and 2pm) and reapplied after exercise or swimming.  The ABCDEs of melanoma were reviewed with the patient, and the importance of monthly self-examination  of moles was emphasized. Should any moles change in shape or color, or itch, bleed or burn, pt will contact our office for evaluation sooner then their interval appointment.  Plan: Sunscreen Recommendations We recommended a broad spectrum sunscreen with a SPF of 30 or higher. SPF 30 sunscreens block approximately 97 percent of the sun's harmful rays. Sunscreens should be applied at least 15 minutes prior to expected sun exposure and then every 2 hours after that as long as sun exposure continues. If swimming or exercising sunscreen should be reapplied every 45 minutes to an hour after getting wet or sweating. One ounce, or the equivalent of a shot glass full of sunscreen, is adequate to protect the skin not covered by a bathing suit. We also recommended a lip balm with a sunscreen as well. Sun protective clothing can be used in lieu of sunscreen but must be worn the entire time you are exposed to the sun's rays.   Important Information   Due to recent changes in healthcare laws, you may see results of your pathology and/or laboratory studies on MyChart before the doctors have had a chance to review them. We understand that in some cases there may be results that are confusing or concerning to you. Please understand that not all results are received at the same time and often the doctors may need to interpret multiple results in order to provide you with the best plan of care or course of treatment.  Therefore, we ask that you please give [place] 2 business days to thoroughly review all your results before contacting the office for clarification. Should we see a critical lab result, you will be contacted sooner.     If You Need Anything After Your Visit   If you have any questions or concerns for your doctor, please call our main line at [phone]. If no one answers, please leave a voicemail as directed and we will return your call as soon as possible. Messages left after 4 pm will be answered the following  business day.    You may also send [place] a message via MyChart. We typically respond to MyChart messages within 1-2 business days.  For prescription refills, please ask your pharmacy to contact our office. Our fax number is [phone].  If you have an urgent issue when the clinic is closed that cannot wait until the next business day, you can page your doctor at the number below.     Please note that while we do our best to be available for urgent issues outside of office hours, we are not available 24/7.    If you have an urgent issue and are unable to reach [place], you may choose to seek medical care at your doctor's office, retail clinic, urgent care center, or emergency room.   If you have a medical emergency, please immediately call 911 or go to the emergency department. In the event of inclement weather, please call our main line at [phone] for an update on the status of any delays or closures.  Dermatology Medication Tips: Please keep the boxes that topical medications come in in order to help keep track of the instructions about where and how to use these. Pharmacies typically print the medication instructions only on the boxes and not directly on the medication tubes.   If your medication is too expensive, please contact our office at [phone] or send [place] a message through MyChart.    We are unable to tell what your co-pay for medications will be in advance as this is different depending on your insurance coverage. However, we may be able to find a substitute medication at lower cost or fill out paperwork to get insurance to cover a needed medication.    If a prior authorization is required to get your medication covered by your insurance company, please allow [place] 1-2 business days to complete this process.   Drug prices often vary depending on where the prescription is filled and some pharmacies may offer cheaper prices.   The website www.goodrx.com contains coupons for  medications through different pharmacies. The prices here do not account for what the cost may be with help from insurance (it may be cheaper with your insurance), but the website can give you the price if you did not use any insurance.  - You can print the associated coupon and take it with your prescription to the pharmacy.  - You may also stop by our office during regular business hours and pick up a GoodRx coupon card.  - If you need your prescription sent electronically to a different pharmacy, notify our office through [facility] or by phone at [phone]

## 2023-08-24 NOTE — Progress Notes (Signed)
   New Patient Visit   Subjective  Lynn [name] is a 73 y.o. female who presents for the following: spots on the face.   Pt has some places on her face she'd like evaluated as she has had precancers before. She has no hx of skin cancer. She states that some of them are gritty and painful. She has previously seen Dr. Jorja Loa.  The patient has spots, moles and lesions to be evaluated, some may be new or changing.  The following portions of the chart were reviewed this encounter and updated as appropriate: medications, allergies, medical history  Review of Systems:  No other skin or systemic complaints except as noted in HPI or Assessment and Plan.  Objective  Well appearing patient in no apparent distress; mood and affect are within normal limits.   A focused examination was performed of the following areas: face  Relevant exam findings are noted in the Assessment and Plan.    Assessment & Plan   ACTINIC KERATOSIS Exam: Erythematous thin papules/macules with gritty scale at the face  Actinic keratoses are precancerous spots that appear secondary to cumulative UV radiation exposure/sun exposure over time. They are chronic with expected duration over 1 year. A portion of actinic keratoses will progress to squamous cell carcinoma of the skin. It is not possible to reliably predict which spots will progress to skin cancer and so treatment is recommended to prevent development of skin cancer.  Recommend daily broad spectrum sunscreen SPF 30+ to sun-exposed areas, reapply every 2 hours as needed.  Recommend staying in the shade or wearing long sleeves, sun glasses (UVA+UVB protection) and wide brim hats (4-inch brim around the entire circumference of the hat). Call for new or changing lesions.  Treatment Plan: Start 5-fluorouracil cream twice a day for 14 days to affected areas including face.  Reviewed course of treatment and expected reaction.  Patient advised to expect  inflammation and crusting and advised that erosions are possible.  Patient advised to be diligent with sun protection during and after treatment. Handout with details of how to apply medication and what to expect provided. Counseled to keep medication out of reach of children and pets.  Reviewed course of treatment and expected reaction.  Patient advised to expect inflammation and crusting and advised that erosions are possible.  Patient advised to be diligent with sun protection during and after treatment. Handout with details of how to apply medication and what to expect provided. Counseled to keep medication out of reach of children and pets.  Return in about 2 months (around 10/24/2023) for [place] f/u.  I, Tillie Fantasia, CMA, am acting as scribe for Gwenith Daily, MD.   Documentation: I have reviewed the above documentation for accuracy and completeness, and I agree with the above.  Gwenith Daily, MD

## 2023-09-08 ENCOUNTER — Ambulatory Visit: Admitting: Family Medicine

## 2023-09-10 ENCOUNTER — Telehealth: Payer: Self-pay | Admitting: Family Medicine

## 2023-09-10 ENCOUNTER — Other Ambulatory Visit: Payer: Self-pay

## 2023-09-10 ENCOUNTER — Other Ambulatory Visit: Payer: Self-pay | Admitting: Family Medicine

## 2023-09-10 MED ORDER — GABAPENTIN 100 MG PO CAPS: 200.0000 mg | ORAL_CAPSULE | Freq: Three times a day (TID) | ORAL | 0 refills | Status: DC

## 2023-09-10 NOTE — Telephone Encounter (Signed)
 Patient called requesting a refill on: gabapentin (NEURONTIN) 100 MG capsule   Pharmacy: [facility]

## 2023-09-15 ENCOUNTER — Ambulatory Visit: Admitting: Family Medicine

## 2023-10-13 NOTE — Progress Notes (Unsigned)
 [name] [address] [place] [address] Phone: [phone] Subjective:   [name], am serving as a scribe for Dr. Ronnell Coins.  I'm seeing this patient by the request  of:  Jess Morita, MD  CC: knee pain   [id number]  05/18/2023 Chronic problem with exacerbation.  Discussed icing regimen and home exercises, discussed which activities to do and which ones to avoid.  Increase activity slowly.  Continue to work on weight loss but patient has done very well and BMI is now under 35.  Would be a surgical candidate if she needed it but hopefully will not be necessary.  Follow-up with me again in 3 months.     Updated 10/14/2023 Lynn [name] is a 73 y.o. female coming in with complaint of B knee pain. Doing better, but thinks its about time for her injections. No new symptoms.       Past Medical History:  Diagnosis Date   Allergy    Arthritis    left knee   Basal cell carcinoma of skin 10/14/1994   Right breast   Basal cell carcinoma of skin 12/07/2011   Left inner elbow - TX p BX   Hyperlipidemia    Hypertension    Hypothyroidism    Squamous cell carcinoma of skin 12/07/2011   Left lower leg - TX p BX   Squamous cell carcinoma of skin 02/02/2017   Left anterior thigh - CX3 + 5FU   Past Surgical History:  Procedure Laterality Date   ABDOMINAL HYSTERECTOMY     c-section 1983     catract  Right 01/15/2022   CESAREAN SECTION  06/27/1981   COLONOSCOPY     EYE SURGERY  01/15/22   Cataract surgery right eye   KNEE ARTHROSCOPY     PARTIAL HYSTERECTOMY     POLYPECTOMY     TONSILLECTOMY     TUBAL LIGATION     Social History   Socioeconomic History   Marital status: Widowed    Spouse name: Not on file   Number of children: Not on file   Years of education: Not on file   Highest education level: Associate degree: occupational, Scientist, product/process development, or vocational program  Occupational History   Occupation: retired   Tobacco Use   Smoking status: Never   Smokeless tobacco: Never   Tobacco comments:    Do not and never have smoked  Vaping Use   Vaping status: Never Used  Substance and Sexual Activity   Alcohol use: Yes   Drug use: No   Sexual activity: Not Currently    Birth control/protection: Post-menopausal  Other Topics Concern   Not on file  Social History Narrative   Not on file   Social Drivers of Health   Financial Resource Strain: Low Risk  (02/10/2023)   Overall Financial Resource Strain (CARDIA)    Difficulty of Paying Living Expenses: Not hard at all  Food Insecurity: No Food Insecurity (02/10/2023)   Hunger Vital Sign    Worried About Running Out of Food in the Last Year: Never true    Ran Out of Food in the Last Year: Never true  Transportation Needs: No Transportation Needs (02/10/2023)   PRAPARE - Administrator, Civil Service (Medical): No    Lack of Transportation (Non-Medical): No  Physical Activity: Insufficiently Active (02/10/2023)   Exercise Vital Sign    Days of Exercise per Week: 1 day    Minutes of Exercise  per Session: 20 min  Stress: No Stress Concern Present (02/10/2023)   [employer] of Occupational Health - Occupational Stress Questionnaire    Feeling of Stress : Only a little  Social Connections: Moderately Isolated (02/10/2023)   Social Connection and Isolation Panel [NHANES]    Frequency of Communication with Friends and Family: More than three times a week    Frequency of Social Gatherings with Friends and Family: Once a week    Attends Religious Services: Never    Database administrator or Organizations: Yes    Attends Banker Meetings: 1 to 4 times per year    Marital Status: Widowed   Allergies  Allergen Reactions   Sulfa Antibiotics Nausea Only and Nausea And Vomiting   Brimonidine Tartrate    Family History  Problem Relation Age of Onset   Colon cancer Father    Cancer Father        skin   Colon polyps Sister     Colon polyps Sister    Colon polyps Sister    Colon polyps Sister    Cancer Maternal Grandfather    Cancer Paternal Grandfather        skin   Pancreatic cancer Neg Hx    Stomach cancer Neg Hx    Esophageal cancer Neg Hx    Rectal cancer Neg Hx     Current Outpatient Medications (Endocrine & Metabolic):    levothyroxine  (SYNTHROID ) 100 MCG tablet, TAKE 1 TABLET (100 MCG TOTAL) BY MOUTH DAILY. LAST REFILL WITHOUT APPT  Current Outpatient Medications (Cardiovascular):    atorvastatin  (LIPITOR) 20 MG tablet, TAKE 1 TABLET BY MOUTH EVERY DAY   furosemide  (LASIX ) 20 MG tablet, TAKE 1 TABLET BY MOUTH EVERY DAY  Current Outpatient Medications (Respiratory):    cetirizine  (ZYRTEC ) 10 MG tablet, Take 1 tablet (10 mg total) by mouth daily.   fluticasone  (FLONASE ) 50 MCG/ACT nasal spray, SPRAY 2 SPRAYS INTO EACH NOSTRIL EVERY DAY   promethazine  (PHENERGAN ) 25 MG tablet, TAKE 1 TABLET BY MOUTH EVERY 6 HOURS AS NEEDED FOR NAUSEA OR VOMITING.    Current Outpatient Medications (Other):    amoxicillin -clavulanate (AUGMENTIN ) 875-125 MG tablet, Take 1 tablet by mouth 2 (two) times daily.   fluorouracil  (EFUDEX ) 5 % cream, Apply topically 2 (two) times daily.   gabapentin  (NEURONTIN ) 100 MG capsule, Take 2 capsules (200 mg total) by mouth 3 (three) times daily.   Multiple Vitamin (MULTIVITAMIN) tablet, Take 1 tablet by mouth daily.   Probiotic Product (PROBIOTIC-10) CHEW, Chew by mouth.   Reviewed prior external information including notes and imaging from  primary care provider As well as notes that were available from care everywhere and other healthcare systems.  Past medical history, social, surgical and family history all reviewed in electronic medical record.  No pertanent information unless stated regarding to the chief complaint.   Review of Systems:  No headache, visual changes, nausea, vomiting, diarrhea, constipation, dizziness, abdominal pain, skin rash, fevers, chills, night  sweats, weight loss, swollen lymph nodes, body aches, joint swelling, chest pain, shortness of breath, mood changes. POSITIVE muscle aches  Objective  Blood pressure (!) 136/96, pulse 60, height 5\' 2"  (1.575 m), weight 189 lb (85.7 kg), SpO2 95%.   General: No apparent distress alert and oriented x3 mood and affect normal, dressed appropriately.  HEENT: Pupils equal, extraocular movements intact  Respiratory: Patient's speak in full sentences and does not appear short of breath  Cardiovascular: No lower extremity edema, non tender, no erythema  Knee exam shows the patient does have significant arthritic changes noted.  Varus deformity of the knee is noted bilaterally.  Instability noted with valgus and varus force.  After informed written and verbal consent, patient was seated on exam table. Right knee was prepped with alcohol swab and utilizing anterolateral approach, patient's right knee space was injected with 4:1  marcaine 0.5%: Kenalog 40mg /dL. Patient tolerated the procedure well without immediate complications.  After informed written and verbal consent, patient was seated on exam table. Left knee was prepped with alcohol swab and utilizing anterolateral approach, patient's left knee space was injected with 4:1  marcaine 0.5%: Kenalog 40mg /dL. Patient tolerated the procedure well without immediate complications.    Impression and Recommendations:     The above documentation has been reviewed and is accurate and complete Deavon Podgorski M Shanora Christensen, DO

## 2023-10-14 ENCOUNTER — Ambulatory Visit: Admitting: Family Medicine

## 2023-10-14 ENCOUNTER — Encounter: Payer: Self-pay | Admitting: Family Medicine

## 2023-10-14 VITALS — BP 136/96 | HR 60 | Ht 62.0 in | Wt 189.0 lb

## 2023-10-14 DIAGNOSIS — M170 Bilateral primary osteoarthritis of knee: Secondary | ICD-10-CM

## 2023-10-14 NOTE — Patient Instructions (Signed)
 Good to see you! Injection in knees today See you again in 3-4 months

## 2023-10-14 NOTE — Assessment & Plan Note (Signed)
 Significant arthritic changes noted in the knees bilaterally.  Patient has had viscosupplementation in the past previously.  Seems to be responding relatively well into steroid injections at 36-month intervals.  Follow-up again in 3 to 6 months otherwise.  Continue to work on losing weight.  BMI is under 35 so she would be a candidate for replacement if needed.

## 2023-10-16 ENCOUNTER — Telehealth

## 2023-10-19 ENCOUNTER — Other Ambulatory Visit: Payer: Self-pay | Admitting: Family Medicine

## 2023-10-25 ENCOUNTER — Encounter: Payer: Self-pay | Admitting: Dermatology

## 2023-10-25 ENCOUNTER — Ambulatory Visit: Admitting: Dermatology

## 2023-10-25 VITALS — BP 110/71 | HR 81

## 2023-10-25 DIAGNOSIS — L570 Actinic keratosis: Secondary | ICD-10-CM | POA: Diagnosis not present

## 2023-10-25 DIAGNOSIS — R238 Other skin changes: Secondary | ICD-10-CM

## 2023-10-25 DIAGNOSIS — W908XXD Exposure to other nonionizing radiation, subsequent encounter: Secondary | ICD-10-CM | POA: Diagnosis not present

## 2023-10-25 NOTE — Patient Instructions (Signed)

## 2023-10-25 NOTE — Progress Notes (Signed)
   Follow-Up Visit   Subjective  Lynn [name] is a 73 y.o. female who presents for the following: AKs Pt here to follow up on efudex  treatment to the face which she has been using twice a day since March. She did have a strong reaction.  The following portions of the chart were reviewed this encounter and updated as appropriate: medications, allergies, medical history  Review of Systems:  No other skin or systemic complaints except as noted in HPI or Assessment and Plan.  Objective  Well appearing patient in no apparent distress; mood and affect are within normal limits.  A focused examination was performed of the following areas: face  Relevant exam findings are noted in the Assessment and Plan.    Assessment & Plan   Erythematous Papule- mid glabella - Reacting to topical 5FU  - Will re-evaluate in 4-6 weeks once she has discontinued topical 5FU  HISTORY OF PRECANCEROUS ACTINIC KERATOSIS forehead - site(s) of PreCancerous Actinic Keratosis clear today. - these may recur and new lesions may form requiring treatment to prevent transformation into skin cancer - observe for new or changing spots and contact [facility] for appointment if occur - photoprotection with sun protective clothing; sunglasses and broad spectrum sunscreen with SPF of at least 30 + and frequent self skin exams recommended - yearly exams by a dermatologist recommended for persons with history of PreCancerous Actinic Keratoses   Return in about 4 weeks (around 11/22/2023) for AK f/u.  I, Wilson Hasten, CMA, am acting as scribe for Deneise Finlay, MD.   Documentation: I have reviewed the above documentation for accuracy and completeness, and I agree with the above.  Deneise Finlay, MD

## 2023-12-05 ENCOUNTER — Other Ambulatory Visit: Payer: Self-pay | Admitting: Family Medicine

## 2023-12-06 ENCOUNTER — Ambulatory Visit (INDEPENDENT_AMBULATORY_CARE_PROVIDER_SITE_OTHER): Admitting: Family Medicine

## 2023-12-06 VITALS — BP 142/80 | HR 81 | Temp 98.1°F | Resp 17 | Ht 62.0 in | Wt 186.4 lb

## 2023-12-06 DIAGNOSIS — M51362 Other intervertebral disc degeneration, lumbar region with discogenic back pain and lower extremity pain: Secondary | ICD-10-CM | POA: Diagnosis not present

## 2023-12-06 DIAGNOSIS — M5431 Sciatica, right side: Secondary | ICD-10-CM

## 2023-12-06 DIAGNOSIS — R030 Elevated blood-pressure reading, without diagnosis of hypertension: Secondary | ICD-10-CM

## 2023-12-06 DIAGNOSIS — R233 Spontaneous ecchymoses: Secondary | ICD-10-CM

## 2023-12-06 LAB — CBC
HCT: 44.7 % (ref 36.0–46.0)
Hemoglobin: 15 g/dL (ref 12.0–15.0)
MCHC: 33.5 g/dL (ref 30.0–36.0)
MCV: 84.5 fl (ref 78.0–100.0)
Platelets: 170 10*3/uL (ref 150.0–400.0)
RBC: 5.29 Mil/uL — ABNORMAL HIGH (ref 3.87–5.11)
RDW: 13.3 % (ref 11.5–15.5)
WBC: 5.1 10*3/uL (ref 4.0–10.5)

## 2023-12-06 LAB — BASIC METABOLIC PANEL WITH GFR
BUN: 10 mg/dL (ref 6–23)
CO2: 30 meq/L (ref 19–32)
Calcium: 9.1 mg/dL (ref 8.4–10.5)
Chloride: 103 meq/L (ref 96–112)
Creatinine, Ser: 0.92 mg/dL (ref 0.40–1.20)
GFR: 61.82 mL/min (ref >60.00)
Glucose, Bld: 79 mg/dL (ref 70–99)
Potassium: 3.5 meq/L (ref 3.5–5.1)
Sodium: 143 meq/L (ref 135–145)

## 2023-12-06 MED ORDER — MELOXICAM 7.5 MG PO TABS
7.5000 mg | ORAL_TABLET | Freq: Every day | ORAL | 0 refills | Status: AC
Start: 2023-12-06 — End: ?

## 2023-12-06 NOTE — Progress Notes (Signed)
 Subjective:  Patient ID: Lynn [name], female    DOB: 1950-12-18  Age: 73 y.o. MRN: [id number]  CC:  Chief Complaint  Patient presents with   Back Pain    Pt notes back pain and Rt leg pain, notes started on Saturday, notes pt notes no improvement     HPI Lynn [name] presents for  Acute concern is as above, PCP is.pcp  Back pain, right leg pain Started 2 days ago. Very active at beach trip past 2 weeks, more active, but no known injury. Walking up and down beach.  Similar sx's of sciatica in the past.  Pain in front of R leg, moves up R leg to R low back/buttock.   No bowel or bladder incontinence, no saddle anesthesia, able to bear weight, not weak in leg. No fever, unexplained weight loss or night sweats.    Tx: alleve, gapapentin on and off - took last 2 nights - some relief. Makes sleepy, but has been able to take in day.   Flushing and insomnia for 1 night after steroid injection in past.   Medical history reviewed with history of degenerative disc disease, lumbar.  X-ray report reviewed from 08/26/2022 with multiple level degenerative disc disease, facet hypertrophy in the lumbar spine most prominent at L4-5 where there is 7 mm of anterolisthesis, mild dextroscoliotic curvature.  With her history of spondylolisthesis, thought to be contributing to spinal stenosis previously, treated with gabapentin  at night.     Additional concern at end of visit, she has noticed some bruising at times on her arms over the past year.  Denies vaginal bleeding, hematuria or gum bleeding.  She is not taking aspirin or other blood thinners. No recent labs.  CBC with normal platelets in 2023.   Blood pressure mildly elevated today, but also with back pain, leg pain as above.  History Patient Active Problem List   Diagnosis Date Noted   Degenerative disc disease, lumbar 08/24/2022   Combined forms of age-related cataract of right eye 01/14/2022   Lateral subluxation of right  patella 08/15/2019   Pain of right hip joint 05/21/2018   Degenerative arthritis of knee, bilateral 10/18/2017   Elevated BP without diagnosis of hypertension 11/05/2016   Physical exam 04/22/2016   Hypothyroidism 10/24/2015   Hyperlipidemia 10/24/2015   Obesity (BMI 35.0-39.9 without comorbidity) 10/24/2015   Past Medical History:  Diagnosis Date   Allergy    Arthritis    left knee   Basal cell carcinoma of skin 10/14/1994   Right breast   Basal cell carcinoma of skin 12/07/2011   Left inner elbow - TX p BX   Hyperlipidemia    Hypertension    Hypothyroidism    Squamous cell carcinoma of skin 12/07/2011   Left lower leg - TX p BX   Squamous cell carcinoma of skin 02/02/2017   Left anterior thigh - CX3 + 5FU   Past Surgical History:  Procedure Laterality Date   ABDOMINAL HYSTERECTOMY     c-section 1983     catract  Right 01/15/2022   CESAREAN SECTION  06/27/1981   COLONOSCOPY     EYE SURGERY  01/15/22   Cataract surgery right eye   KNEE ARTHROSCOPY     PARTIAL HYSTERECTOMY     POLYPECTOMY     TONSILLECTOMY     TUBAL LIGATION     Allergies  Allergen Reactions   Sulfa Antibiotics Nausea Only and Nausea And Vomiting   Brimonidine Tartrate  Prior to Admission medications   Medication Sig Start Date End Date Taking? Authorizing Provider  atorvastatin  (LIPITOR) 20 MG tablet TAKE 1 TABLET BY MOUTH EVERY DAY 02/18/22  Yes Tabori, Katherine E, MD  cetirizine  (ZYRTEC ) 10 MG tablet Take 1 tablet (10 mg total) by mouth daily. 03/04/23  Yes Soldatova, Liuba, MD  fluorouracil  (EFUDEX ) 5 % cream Apply topically 2 (two) times daily. 08/24/23  Yes Paci, Karina M, MD  fluticasone  (FLONASE ) 50 MCG/ACT nasal spray SPRAY 2 SPRAYS INTO EACH NOSTRIL EVERY DAY 07/13/23  Yes Tabori, Katherine E, MD  furosemide  (LASIX ) 20 MG tablet TAKE 1 TABLET BY MOUTH EVERY DAY 10/20/23  Yes Tabori, Katherine E, MD  gabapentin  (NEURONTIN ) 100 MG capsule TAKE 2 CAPSULES BY MOUTH 3 TIMES DAILY. 12/06/23  Yes  Claudene Arthea HERO, DO  levothyroxine  (SYNTHROID ) 100 MCG tablet TAKE 1 TABLET (100 MCG TOTAL) BY MOUTH DAILY. LAST REFILL WITHOUT APPT 08/12/23  Yes Tabori, Katherine E, MD  Multiple Vitamin (MULTIVITAMIN) tablet Take 1 tablet by mouth daily.   Yes [provider]  Probiotic Product (PROBIOTIC-10) CHEW Chew by mouth.   Yes [provider]  promethazine  (PHENERGAN ) 25 MG tablet TAKE 1 TABLET BY MOUTH EVERY 6 HOURS AS NEEDED FOR NAUSEA OR VOMITING. 01/16/22  Yes Tabori, Katherine E, MD  amoxicillin -clavulanate (AUGMENTIN ) 875-125 MG tablet Take 1 tablet by mouth 2 (two) times daily. Patient not taking: Reported on 12/06/2023 07/16/23   Vivienne Delon HERO, PA-C   Social History   Socioeconomic History   Marital status: Widowed    Spouse name: Not on file   Number of children: Not on file   Years of education: Not on file   Highest education level: Some college, no degree  Occupational History   Occupation: retired  Tobacco Use   Smoking status: Never   Smokeless tobacco: Never   Tobacco comments:    Do not and never have smoked  Vaping Use   Vaping status: Never Used  Substance and Sexual Activity   Alcohol use: Yes   Drug use: No   Sexual activity: Not Currently    Birth control/protection: Post-menopausal  Other Topics Concern   Not on file  Social History Narrative   Not on file   Social Drivers of Health   Financial Resource Strain: Low Risk  (12/06/2023)   Overall Financial Resource Strain (CARDIA)    Difficulty of Paying Living Expenses: Not hard at all  Food Insecurity: No Food Insecurity (12/06/2023)   Hunger Vital Sign    Worried About Running Out of Food in the Last Year: Never true    Ran Out of Food in the Last Year: Never true  Transportation Needs: No Transportation Needs (12/06/2023)   PRAPARE - Administrator, Civil Service (Medical): No    Lack of Transportation (Non-Medical): No  Physical Activity: Insufficiently Active (12/06/2023)    Exercise Vital Sign    Days of Exercise per Week: 3 days    Minutes of Exercise per Session: 30 min  Stress: No Stress Concern Present (12/06/2023)   [employer] of Occupational Health - Occupational Stress Questionnaire    Feeling of Stress: Only a little  Social Connections: Moderately Integrated (12/06/2023)   Social Connection and Isolation Panel    Frequency of Communication with Friends and Family: Three times a week    Frequency of Social Gatherings with Friends and Family: Once a week    Attends Religious Services: More than 4 times per year  Active Member of Clubs or Organizations: Yes    Attends Banker Meetings: 1 to 4 times per year    Marital Status: Widowed  Intimate Partner Violence: Not At Risk (02/10/2023)   Humiliation, Afraid, Rape, and Kick questionnaire    Fear of Current or Ex-Partner: No    Emotionally Abused: No    Physically Abused: No    Sexually Abused: No    Review of Systems   Objective:   Vitals:   12/06/23 1102 12/06/23 1121  BP: (!) 142/86 (!) 142/80  Pulse: 81   Resp: 17   Temp: 98.1 F (36.7 C)   TempSrc: Temporal   SpO2: 97%   Weight: 186 lb 6.4 oz (84.6 kg)   Height: 5' 2 (1.575 m)      Physical Exam Vitals reviewed.  Constitutional:      Appearance: Normal appearance. She is well-developed.  HENT:     Head: Normocephalic and atraumatic.     Mouth/Throat:     Comments: No bleeding seen within oropharynx or around gums.  Eyes:     Conjunctiva/sclera: Conjunctivae normal.     Pupils: Pupils are equal, round, and reactive to light.   Neck:     Vascular: No carotid bruit.   Cardiovascular:     Rate and Rhythm: Normal rate and regular rhythm.     Heart sounds: Normal heart sounds.  Pulmonary:     Effort: Pulmonary effort is normal.     Breath sounds: Normal breath sounds.  Abdominal:     Palpations: Abdomen is soft. There is no pulsatile mass.     Tenderness: There is no abdominal tenderness.    Musculoskeletal:     Right lower leg: No edema.     Left lower leg: No edema.     Comments: Positive seated straight leg raise on the right.  Able to ambulate without assistive device, able to heel and toe walk without difficulty.  No midline lumbar tenderness.  Describes area of discomfort toward the sciatic notch, down posterior thigh and toward anterior right lower leg.    Skin:    General: Skin is warm and dry.     Comments: Few healing bruises on dorsum of hands.   Neurological:     Mental Status: She is alert and oriented to person, place, and time.   Psychiatric:        Mood and Affect: Mood normal.        Behavior: Behavior normal.        Assessment & Plan:  Lynn [name] is a 73 y.o. female . Right sided sciatica - Plan: meloxicam  (MOBIC ) 7.5 MG tablet  Degeneration of intervertebral disc of lumbar region with discogenic back pain and lower extremity pain  Easy bruising - Plan: CBC  Elevated blood pressure reading - Plan: Basic metabolic panel with GFR  Suspected sciatica, flare of spinal stenosis with her degenerative disc disease, spondylolisthesis noted previously.  Without specific injury and no midline bony tenderness, no red flags on exam or history, will defer imaging at this time.  Start meloxicam  for anti-inflammatory, do not combine with NSAIDs.  Short-term treatment discussed.  Hold on prednisone for now as she did have some side effects with a steroid injection previously, but would consider this next step.  Can continue home gabapentin  with option of twice daily dosing.  RTC precautions, handout given.  Blood pressure elevated today including on recheck.  Will check labs including CBC with her concerns for easy  bruising on extremities over the past year.  Follow-up with PCP next few weeks.  RTC precautions.  Meds ordered this encounter  Medications   meloxicam  (MOBIC ) 7.5 MG tablet    Sig: Take 1 tablet (7.5 mg total) by mouth daily.    Dispense:   30 tablet    Refill:  0   Patient Instructions  Based on your symptoms today I do think you have a flare of sciatica, from degenerative disc disease, spinal stenosis of the back.  You can take the meloxicam  that I prescribed today once per day for the next week or 2 at the most.  Do not combine that with Advil, Aleve or other anti-inflammatories but Tylenol is okay.  Continue gabapentin , with option to take that twice per day to help with nerve pain.  If not improving in the next 1 week we could consider prednisone, but I know you did have some side effects from a steroid injection previously.  Be seen if any new or worsening symptoms.  I will check a blood count, other blood work, but please follow-up with your primary care provider in the next few weeks to discuss the bruising further as well as follow-up of your medications and recheck blood pressure.  Return to the clinic or go to the nearest emergency room if any of your symptoms worsen or new symptoms occur.  Sciatica  Sciatica is pain, numbness, weakness, or tingling along the path of the sciatic nerve. The sciatic nerve starts in the lower back and runs down the back of each leg. The nerve controls the muscles in the lower leg and in the back of the knee. It also provides feeling (sensation) to the back of the thigh, the lower leg, and the sole of the foot. Sciatica is a symptom of another medical condition that pinches or puts pressure on the sciatic nerve. Sciatica most often only affects one side of the body. Sciatica usually goes away on its own or with treatment. In some cases, sciatica may come back (recur). What are the causes? This condition is caused by pressure on the sciatic nerve or pinching of the nerve. This may be the result of: A disk in between the bones of the spine bulging out too far (herniated disk). Age-related changes in the spinal disks. A pain disorder that affects a muscle in the buttock. Extra bone growth near  the sciatic nerve. A break (fracture) of the pelvis. Pregnancy. Tumor. This is rare. What increases the risk? The following factors may make you more likely to develop this condition: Playing sports that place pressure or stress on the spine. Having poor strength and flexibility. A history of back injury or surgery. Sitting for long periods of time. Doing activities that involve repetitive bending or lifting. Obesity. What are the signs or symptoms? Symptoms can vary from mild to very severe. They may include: Any of the following problems in the lower back, leg, hip, or buttock: Mild tingling, numbness, or dull aches. Burning sensations. Sharp pains. Numbness in the back of the calf or the sole of the foot. Leg weakness. Severe back pain that makes movement difficult. Symptoms may get worse when you cough, sneeze, or laugh, or when you sit or stand for long periods of time. How is this diagnosed? This condition may be diagnosed based on: Your symptoms and medical history. A physical exam. Blood tests. Imaging tests, such as: X-rays. An MRI. A CT scan. How is this treated? In many cases, this  condition improves on its own without treatment. However, treatment may include: Reducing or modifying physical activity. Exercising, including strengthening and stretching. Icing and applying heat to the affected area. Medicines that help to: Relieve pain and swelling. Relax your muscles. Injections of medicines that help to relieve pain and inflammation (steroids) around the sciatic nerve. Surgery. Follow these instructions at home: Medicines Take over-the-counter and prescription medicines only as told by your health care provider. Ask your health care provider if the medicine prescribed to you requires you to avoid driving or using heavy machinery. Managing pain     If directed, put ice on the affected area. To do this: Put ice in a plastic bag. Place a towel between your  skin and the bag. Leave the ice on for 20 minutes, 2-3 times a day. If your skin turns bright red, remove the ice right away to prevent skin damage. The risk of skin damage is higher if you cannot feel pain, heat, or cold. If directed, apply heat to the affected area as often as told by your health care provider. Use the heat source that your health care provider recommends, such as a moist heat pack or a heating pad. Place a towel between your skin and the heat source. Leave the heat on for 20-30 minutes. If your skin turns bright red, remove the heat right away to prevent burns. The risk of burns is higher if you cannot feel pain, heat, or cold. Activity  Return to your normal activities as told by your health care provider. Ask your health care provider what activities are safe for you. Avoid activities that make your symptoms worse. Take brief periods of rest throughout the day. When you rest for longer periods, mix in some mild activity or stretching between periods of rest. This will help to prevent stiffness and pain. Avoid sitting for long periods of time without moving. Get up and move around at least one time each hour. Exercise and stretch regularly as told by your health care provider. Do not lift anything that is heavier than 10 lb (4.5 kg) until your health care provider says that it is safe. When you do not have symptoms, you should still avoid heavy lifting, especially repetitive heavy lifting. When you lift objects, always use proper lifting technique, which includes: Bending your knees. Keeping the load close to your body. Avoiding twisting. General instructions Maintain a healthy weight. Excess weight puts extra stress on your back. Wear supportive, comfortable shoes. Avoid wearing high heels. Avoid sleeping on a mattress that is too soft or too hard. A mattress that is firm enough to support your back when you sleep may help to reduce your pain. Contact a health care  provider if: Your pain is not controlled by medicine. Your pain does not improve or gets worse. Your pain lasts longer than 4 weeks. You have unexplained weight loss. Get help right away if: You are not able to control when you urinate or have bowel movements (incontinence). You have: Weakness in your lower back, pelvis, buttocks, or legs that gets worse. Redness or swelling of your back. A burning sensation when you urinate. Summary Sciatica is pain, numbness, weakness, or tingling along the path of the sciatic nerve, which may include the lower back, legs, hips, and buttocks. This condition is caused by pressure on the sciatic nerve or pinching of the nerve. Treatment often includes rest, exercise, medicines, and applying ice or heat. This information is not intended to replace advice given  to you by your health care provider. Make sure you discuss any questions you have with your health care provider. Document Revised: 09/01/2021 Document Reviewed: 09/01/2021 Elsevier Patient Education  2024 [employer]    Signed,   Reyes Pines, MD [facility], [facility] 12/06/23 11:44 AM

## 2023-12-06 NOTE — Patient Instructions (Signed)
 Based on your symptoms today I do think you have a flare of sciatica, from degenerative disc disease, spinal stenosis of the back.  You can take the meloxicam  that I prescribed today once per day for the next week or 2 at the most.  Do not combine that with Advil, Aleve or other anti-inflammatories but Tylenol is okay.  Continue gabapentin , with option to take that twice per day to help with nerve pain.  If not improving in the next 1 week we could consider prednisone, but I know you did have some side effects from a steroid injection previously.  Be seen if any new or worsening symptoms.  I will check a blood count, other blood work, but please follow-up with your primary care provider in the next few weeks to discuss the bruising further as well as follow-up of your medications and recheck blood pressure.  Return to the clinic or go to the nearest emergency room if any of your symptoms worsen or new symptoms occur.  Sciatica  Sciatica is pain, numbness, weakness, or tingling along the path of the sciatic nerve. The sciatic nerve starts in the lower back and runs down the back of each leg. The nerve controls the muscles in the lower leg and in the back of the knee. It also provides feeling (sensation) to the back of the thigh, the lower leg, and the sole of the foot. Sciatica is a symptom of another medical condition that pinches or puts pressure on the sciatic nerve. Sciatica most often only affects one side of the body. Sciatica usually goes away on its own or with treatment. In some cases, sciatica may come back (recur). What are the causes? This condition is caused by pressure on the sciatic nerve or pinching of the nerve. This may be the result of: A disk in between the bones of the spine bulging out too far (herniated disk). Age-related changes in the spinal disks. A pain disorder that affects a muscle in the buttock. Extra bone growth near the sciatic nerve. A break (fracture) of the  pelvis. Pregnancy. Tumor. This is rare. What increases the risk? The following factors may make you more likely to develop this condition: Playing sports that place pressure or stress on the spine. Having poor strength and flexibility. A history of back injury or surgery. Sitting for long periods of time. Doing activities that involve repetitive bending or lifting. Obesity. What are the signs or symptoms? Symptoms can vary from mild to very severe. They may include: Any of the following problems in the lower back, leg, hip, or buttock: Mild tingling, numbness, or dull aches. Burning sensations. Sharp pains. Numbness in the back of the calf or the sole of the foot. Leg weakness. Severe back pain that makes movement difficult. Symptoms may get worse when you cough, sneeze, or laugh, or when you sit or stand for long periods of time. How is this diagnosed? This condition may be diagnosed based on: Your symptoms and medical history. A physical exam. Blood tests. Imaging tests, such as: X-rays. An MRI. A CT scan. How is this treated? In many cases, this condition improves on its own without treatment. However, treatment may include: Reducing or modifying physical activity. Exercising, including strengthening and stretching. Icing and applying heat to the affected area. Medicines that help to: Relieve pain and swelling. Relax your muscles. Injections of medicines that help to relieve pain and inflammation (steroids) around the sciatic nerve. Surgery. Follow these instructions at home: Medicines Take  over-the-counter and prescription medicines only as told by your health care provider. Ask your health care provider if the medicine prescribed to you requires you to avoid driving or using heavy machinery. Managing pain     If directed, put ice on the affected area. To do this: Put ice in a plastic bag. Place a towel between your skin and the bag. Leave the ice on for 20  minutes, 2-3 times a day. If your skin turns bright red, remove the ice right away to prevent skin damage. The risk of skin damage is higher if you cannot feel pain, heat, or cold. If directed, apply heat to the affected area as often as told by your health care provider. Use the heat source that your health care provider recommends, such as a moist heat pack or a heating pad. Place a towel between your skin and the heat source. Leave the heat on for 20-30 minutes. If your skin turns bright red, remove the heat right away to prevent burns. The risk of burns is higher if you cannot feel pain, heat, or cold. Activity  Return to your normal activities as told by your health care provider. Ask your health care provider what activities are safe for you. Avoid activities that make your symptoms worse. Take brief periods of rest throughout the day. When you rest for longer periods, mix in some mild activity or stretching between periods of rest. This will help to prevent stiffness and pain. Avoid sitting for long periods of time without moving. Get up and move around at least one time each hour. Exercise and stretch regularly as told by your health care provider. Do not lift anything that is heavier than 10 lb (4.5 kg) until your health care provider says that it is safe. When you do not have symptoms, you should still avoid heavy lifting, especially repetitive heavy lifting. When you lift objects, always use proper lifting technique, which includes: Bending your knees. Keeping the load close to your body. Avoiding twisting. General instructions Maintain a healthy weight. Excess weight puts extra stress on your back. Wear supportive, comfortable shoes. Avoid wearing high heels. Avoid sleeping on a mattress that is too soft or too hard. A mattress that is firm enough to support your back when you sleep may help to reduce your pain. Contact a health care provider if: Your pain is not controlled by  medicine. Your pain does not improve or gets worse. Your pain lasts longer than 4 weeks. You have unexplained weight loss. Get help right away if: You are not able to control when you urinate or have bowel movements (incontinence). You have: Weakness in your lower back, pelvis, buttocks, or legs that gets worse. Redness or swelling of your back. A burning sensation when you urinate. Summary Sciatica is pain, numbness, weakness, or tingling along the path of the sciatic nerve, which may include the lower back, legs, hips, and buttocks. This condition is caused by pressure on the sciatic nerve or pinching of the nerve. Treatment often includes rest, exercise, medicines, and applying ice or heat. This information is not intended to replace advice given to you by your health care provider. Make sure you discuss any questions you have with your health care provider. Document Revised: 09/01/2021 Document Reviewed: 09/01/2021 Elsevier Patient Education  2024 [employer].

## 2023-12-07 ENCOUNTER — Ambulatory Visit: Payer: Self-pay | Admitting: Family Medicine

## 2023-12-08 ENCOUNTER — Ambulatory Visit: Admitting: Dermatology

## 2023-12-13 ENCOUNTER — Ambulatory Visit: Admitting: Family Medicine

## 2023-12-14 NOTE — Progress Notes (Unsigned)
 [name] [address] [place] [address] Phone: [phone] Subjective:   LILLETTE Claretha Schimke am a scribe for Dr. Claudene.   I'm seeing this patient by the request  of:  Mahlon Comer BRAVO, MD  CC: Back pain follow-up  YEP:Dlagzrupcz  Laurinda Carreno is a 73 y.o. female coming in with complaint of lumbar spine pain w R sciatica. Patient seen in May for knee pain. Patient states that it is up the side of the leg today and in her behind.       Past Medical History:  Diagnosis Date   Allergy    Arthritis    left knee   Basal cell carcinoma of skin 10/14/1994   Right breast   Basal cell carcinoma of skin 12/07/2011   Left inner elbow - TX p BX   Hyperlipidemia    Hypertension    Hypothyroidism    Squamous cell carcinoma of skin 12/07/2011   Left lower leg - TX p BX   Squamous cell carcinoma of skin 02/02/2017   Left anterior thigh - CX3 + 5FU   Past Surgical History:  Procedure Laterality Date   ABDOMINAL HYSTERECTOMY     c-section 1983     catract  Right 01/15/2022   CESAREAN SECTION  06/27/1981   COLONOSCOPY     EYE SURGERY  01/15/22   Cataract surgery right eye   KNEE ARTHROSCOPY     PARTIAL HYSTERECTOMY     POLYPECTOMY     TONSILLECTOMY     TUBAL LIGATION     Social History   Socioeconomic History   Marital status: Widowed    Spouse name: Not on file   Number of children: Not on file   Years of education: Not on file   Highest education level: Some college, no degree  Occupational History   Occupation: retired  Tobacco Use   Smoking status: Never   Smokeless tobacco: Never   Tobacco comments:    Do not and never have smoked  Vaping Use   Vaping status: Never Used  Substance and Sexual Activity   Alcohol use: Yes   Drug use: No   Sexual activity: Not Currently    Birth control/protection: Post-menopausal  Other Topics Concern   Not on file  Social History Narrative   Not on file   Social Drivers of  Health   Financial Resource Strain: Low Risk  (12/06/2023)   Overall Financial Resource Strain (CARDIA)    Difficulty of Paying Living Expenses: Not hard at all  Food Insecurity: No Food Insecurity (12/06/2023)   Hunger Vital Sign    Worried About Running Out of Food in the Last Year: Never true    Ran Out of Food in the Last Year: Never true  Transportation Needs: No Transportation Needs (12/06/2023)   PRAPARE - Administrator, Civil Service (Medical): No    Lack of Transportation (Non-Medical): No  Physical Activity: Insufficiently Active (12/06/2023)   Exercise Vital Sign    Days of Exercise per Week: 3 days    Minutes of Exercise per Session: 30 min  Stress: No Stress Concern Present (12/06/2023)   [employer] of Occupational Health - Occupational Stress Questionnaire    Feeling of Stress: Only a little  Social Connections: Moderately Integrated (12/06/2023)   Social Connection and Isolation Panel    Frequency of Communication with Friends and Family: Three times a week    Frequency of Social Gatherings with Friends  and Family: Once a week    Attends Religious Services: More than 4 times per year    Active Member of Clubs or Organizations: Yes    Attends Banker Meetings: 1 to 4 times per year    Marital Status: Widowed   Allergies  Allergen Reactions   Sulfa Antibiotics Nausea Only and Nausea And Vomiting   Brimonidine Tartrate    Family History  Problem Relation Age of Onset   Colon cancer Father    Cancer Father        skin   Colon polyps Sister    Colon polyps Sister    Colon polyps Sister    Colon polyps Sister    Cancer Maternal Grandfather    Cancer Paternal Grandfather        skin   Pancreatic cancer Neg Hx    Stomach cancer Neg Hx    Esophageal cancer Neg Hx    Rectal cancer Neg Hx     Current Outpatient Medications (Endocrine & Metabolic):    levothyroxine  (SYNTHROID ) 100 MCG tablet, TAKE 1 TABLET (100 MCG TOTAL) BY MOUTH  DAILY. LAST REFILL WITHOUT APPT  Current Outpatient Medications (Cardiovascular):    atorvastatin  (LIPITOR) 20 MG tablet, TAKE 1 TABLET BY MOUTH EVERY DAY   furosemide  (LASIX ) 20 MG tablet, TAKE 1 TABLET BY MOUTH EVERY DAY  Current Outpatient Medications (Respiratory):    cetirizine  (ZYRTEC ) 10 MG tablet, Take 1 tablet (10 mg total) by mouth daily.   fluticasone  (FLONASE ) 50 MCG/ACT nasal spray, SPRAY 2 SPRAYS INTO EACH NOSTRIL EVERY DAY   promethazine  (PHENERGAN ) 25 MG tablet, TAKE 1 TABLET BY MOUTH EVERY 6 HOURS AS NEEDED FOR NAUSEA OR VOMITING.  Current Outpatient Medications (Analgesics):    meloxicam  (MOBIC ) 7.5 MG tablet, Take 1 tablet (7.5 mg total) by mouth daily.   Current Outpatient Medications (Other):    fluorouracil  (EFUDEX ) 5 % cream, Apply topically 2 (two) times daily.   gabapentin  (NEURONTIN ) 100 MG capsule, TAKE 2 CAPSULES BY MOUTH 3 TIMES DAILY.   Multiple Vitamin (MULTIVITAMIN) tablet, Take 1 tablet by mouth daily.   Probiotic Product (PROBIOTIC-10) CHEW, Chew by mouth.   amoxicillin -clavulanate (AUGMENTIN ) 875-125 MG tablet, Take 1 tablet by mouth 2 (two) times daily. (Patient not taking: Reported on 12/16/2023)   Reviewed prior external information including notes and imaging from  primary care provider As well as notes that were available from care everywhere and other healthcare systems.  Past medical history, social, surgical and family history all reviewed in electronic medical record.  No pertanent information unless stated regarding to the chief complaint.   Review of Systems:  No headache, visual changes, nausea, vomiting, diarrhea, constipation, dizziness, abdominal pain, skin rash, fevers, chills, night sweats, weight loss, swollen lymph nodes, joint swelling, chest pain, shortness of breath, mood changes. POSITIVE muscle aches, body aches  Objective  Blood pressure (!) 142/90, pulse 90, height 5' 2 (1.575 m), weight 184 lb 8 oz (83.7 kg), SpO2 97%.    General: No apparent distress alert and oriented x3 mood and affect normal, dressed appropriately.  HEENT: Pupils equal, extraocular movements intact  Respiratory: Patient's speak in full sentences and does not appear short of breath  Cardiovascular: No lower extremity edema, non tender, no erythema  Back exam does have some loss lordosis.  Severe tenderness in the gluteal area on the right side.  Tightness with FABER test.  Still has tightness with straight leg test but no true radicular symptoms.  4-5 strength of  the lower extremities but symmetric    Impression and Recommendations:    The above documentation has been reviewed and is accurate and complete Breylon Sherrow M Sufian Ravi, DO

## 2023-12-16 ENCOUNTER — Ambulatory Visit: Admitting: Family Medicine

## 2023-12-16 ENCOUNTER — Ambulatory Visit (INDEPENDENT_AMBULATORY_CARE_PROVIDER_SITE_OTHER)

## 2023-12-16 VITALS — BP 142/90 | HR 90 | Ht 62.0 in | Wt 184.5 lb

## 2023-12-16 DIAGNOSIS — M5135 Other intervertebral disc degeneration, thoracolumbar region: Secondary | ICD-10-CM | POA: Diagnosis not present

## 2023-12-16 DIAGNOSIS — M549 Dorsalgia, unspecified: Secondary | ICD-10-CM | POA: Diagnosis not present

## 2023-12-16 DIAGNOSIS — M51362 Other intervertebral disc degeneration, lumbar region with discogenic back pain and lower extremity pain: Secondary | ICD-10-CM

## 2023-12-16 DIAGNOSIS — M545 Low back pain, unspecified: Secondary | ICD-10-CM

## 2023-12-16 DIAGNOSIS — M419 Scoliosis, unspecified: Secondary | ICD-10-CM | POA: Diagnosis not present

## 2023-12-16 DIAGNOSIS — M25551 Pain in right hip: Secondary | ICD-10-CM | POA: Diagnosis not present

## 2023-12-16 DIAGNOSIS — M4316 Spondylolisthesis, lumbar region: Secondary | ICD-10-CM | POA: Diagnosis not present

## 2023-12-16 DIAGNOSIS — M47816 Spondylosis without myelopathy or radiculopathy, lumbar region: Secondary | ICD-10-CM | POA: Diagnosis not present

## 2023-12-16 DIAGNOSIS — M8580 Other specified disorders of bone density and structure, unspecified site: Secondary | ICD-10-CM | POA: Diagnosis not present

## 2023-12-16 MED ORDER — KETOROLAC TROMETHAMINE 60 MG/2ML IM SOLN
60.0000 mg | Freq: Once | INTRAMUSCULAR | Status: AC
Start: 2023-12-16 — End: 2023-12-16
  Administered 2023-12-16: 60 mg via INTRAMUSCULAR

## 2023-12-16 MED ORDER — METHYLPREDNISOLONE ACETATE 80 MG/ML IJ SUSP
80.0000 mg | Freq: Once | INTRAMUSCULAR | Status: AC
Start: 2023-12-16 — End: 2023-12-16
  Administered 2023-12-16: 80 mg via INTRAMUSCULAR

## 2023-12-16 NOTE — Assessment & Plan Note (Signed)
 Degenerative disc disease.  Discussed icing regimen and home exercises, discussed core strengthening.  Toradol  and Depo-Medrol  injections given today.  Increase patient's gabapentin  at this time.  Follow-up with me again 2 months otherwise.  Worsening pain, weakness, may need to consider advanced imaging.

## 2023-12-16 NOTE — Patient Instructions (Addendum)
 Full cocktail today. Lumbar x-ray and right hip on the way out. 3 pills of gabapentin  100 mg at night. Sciatic nerve exercises.  See me again in 8 weeks.

## 2023-12-19 ENCOUNTER — Ambulatory Visit: Payer: Self-pay | Admitting: Family Medicine

## 2023-12-21 DIAGNOSIS — J0140 Acute pansinusitis, unspecified: Secondary | ICD-10-CM | POA: Diagnosis not present

## 2023-12-21 DIAGNOSIS — T3695XA Adverse effect of unspecified systemic antibiotic, initial encounter: Secondary | ICD-10-CM | POA: Diagnosis not present

## 2023-12-21 DIAGNOSIS — B379 Candidiasis, unspecified: Secondary | ICD-10-CM | POA: Diagnosis not present

## 2024-01-12 ENCOUNTER — Ambulatory Visit: Admitting: Dermatology

## 2024-01-12 ENCOUNTER — Ambulatory Visit: Admitting: Family Medicine

## 2024-01-14 ENCOUNTER — Ambulatory Visit: Admitting: Family Medicine

## 2024-01-17 ENCOUNTER — Ambulatory Visit: Admitting: Dermatology

## 2024-01-17 ENCOUNTER — Ambulatory Visit: Admitting: Family Medicine

## 2024-02-03 ENCOUNTER — Other Ambulatory Visit: Payer: Self-pay | Admitting: Family Medicine

## 2024-02-09 NOTE — Progress Notes (Unsigned)
 [name] [address] [place] [address] Phone: [phone] Subjective:   Lynn [name], am serving as a scribe for Dr. Arthea Claudene.  I'm seeing this patient by the request  of:  Mahlon Comer BRAVO, MD  CC: Bilateral knee pain  YEP:Dlagzrupcz  12/16/2023 Degenerative disc disease.  Discussed icing regimen and home exercises, discussed core strengthening.  Toradol  and Depo-Medrol  injections given today.  Increase patient's gabapentin  at this time.  Follow-up with me again 2 months otherwise.  Worsening pain, weakness, may need to consider advanced imaging.     Update 02/10/2024 Lynn [name] is a 73 y.o. female coming in with complaint of lumbar spine pain. Patient states here for knees today. Back is doing about the same    Past Medical History:  Diagnosis Date   Allergy    Arthritis    left knee   Basal cell carcinoma of skin 10/14/1994   Right breast   Basal cell carcinoma of skin 12/07/2011   Left inner elbow - TX p BX   Hyperlipidemia    Hypertension    Hypothyroidism    Squamous cell carcinoma of skin 12/07/2011   Left lower leg - TX p BX   Squamous cell carcinoma of skin 02/02/2017   Left anterior thigh - CX3 + 5FU   Past Surgical History:  Procedure Laterality Date   ABDOMINAL HYSTERECTOMY     c-section 1983     catract  Right 01/15/2022   CESAREAN SECTION  06/27/1981   COLONOSCOPY     EYE SURGERY  01/15/22   Cataract surgery right eye   KNEE ARTHROSCOPY     PARTIAL HYSTERECTOMY     POLYPECTOMY     TONSILLECTOMY     TUBAL LIGATION     Social History   Socioeconomic History   Marital status: Widowed    Spouse name: Not on file   Number of children: Not on file   Years of education: Not on file   Highest education level: Some college, no degree  Occupational History   Occupation: retired  Tobacco Use   Smoking status: Never   Smokeless tobacco: Never   Tobacco comments:    Do not and never have smoked   Vaping Use   Vaping status: Never Used  Substance and Sexual Activity   Alcohol use: Yes   Drug use: No   Sexual activity: Not Currently    Birth control/protection: Post-menopausal  Other Topics Concern   Not on file  Social History Narrative   Not on file   Social Drivers of Health   Financial Resource Strain: Low Risk  (12/06/2023)   Overall Financial Resource Strain (CARDIA)    Difficulty of Paying Living Expenses: Not hard at all  Food Insecurity: No Food Insecurity (12/06/2023)   Hunger Vital Sign    Worried About Running Out of Food in the Last Year: Never true    Ran Out of Food in the Last Year: Never true  Transportation Needs: No Transportation Needs (12/06/2023)   PRAPARE - Administrator, Civil Service (Medical): No    Lack of Transportation (Non-Medical): No  Physical Activity: Insufficiently Active (12/06/2023)   Exercise Vital Sign    Days of Exercise per Week: 3 days    Minutes of Exercise per Session: 30 min  Stress: No Stress Concern Present (12/06/2023)   [employer] of Occupational Health - Occupational Stress Questionnaire    Feeling of Stress: Only a  little  Social Connections: Moderately Integrated (12/06/2023)   Social Connection and Isolation Panel    Frequency of Communication with Friends and Family: Three times a week    Frequency of Social Gatherings with Friends and Family: Once a week    Attends Religious Services: More than 4 times per year    Active Member of [employer] or Organizations: Yes    Attends Banker Meetings: 1 to 4 times per year    Marital Status: Widowed   Allergies  Allergen Reactions   Sulfa Antibiotics Nausea Only and Nausea And Vomiting   Brimonidine Tartrate    Family History  Problem Relation Age of Onset   Colon cancer Father    Cancer Father        skin   Colon polyps Sister    Colon polyps Sister    Colon polyps Sister    Colon polyps Sister    Cancer Maternal Grandfather    Cancer  Paternal Grandfather        skin   Pancreatic cancer Neg Hx    Stomach cancer Neg Hx    Esophageal cancer Neg Hx    Rectal cancer Neg Hx     Current Outpatient Medications (Endocrine & Metabolic):    levothyroxine  (SYNTHROID ) 100 MCG tablet, TAKE 1 TABLET (100 MCG TOTAL) BY MOUTH DAILY. LAST REFILL WITHOUT APPT  Current Outpatient Medications (Cardiovascular):    atorvastatin  (LIPITOR) 20 MG tablet, TAKE 1 TABLET BY MOUTH EVERY DAY   furosemide  (LASIX ) 20 MG tablet, TAKE 1 TABLET BY MOUTH EVERY DAY  Current Outpatient Medications (Respiratory):    cetirizine  (ZYRTEC ) 10 MG tablet, Take 1 tablet (10 mg total) by mouth daily.   fluticasone  (FLONASE ) 50 MCG/ACT nasal spray, SPRAY 2 SPRAYS INTO EACH NOSTRIL EVERY DAY   promethazine  (PHENERGAN ) 25 MG tablet, TAKE 1 TABLET BY MOUTH EVERY 6 HOURS AS NEEDED FOR NAUSEA OR VOMITING.  Current Outpatient Medications (Analgesics):    meloxicam  (MOBIC ) 7.5 MG tablet, Take 1 tablet (7.5 mg total) by mouth daily.   Current Outpatient Medications (Other):    amoxicillin -clavulanate (AUGMENTIN ) 875-125 MG tablet, Take 1 tablet by mouth 2 (two) times daily. (Patient not taking: Reported on 12/16/2023)   fluorouracil  (EFUDEX ) 5 % cream, Apply topically 2 (two) times daily.   gabapentin  (NEURONTIN ) 100 MG capsule, TAKE 2 CAPSULES BY MOUTH 3 TIMES DAILY.   Multiple Vitamin (MULTIVITAMIN) tablet, Take 1 tablet by mouth daily.   Probiotic Product (PROBIOTIC-10) CHEW, Chew by mouth.   Reviewed prior external information including notes and imaging from  primary care provider As well as notes that were available from care everywhere and other healthcare systems.  Past medical history, social, surgical and family history all reviewed in electronic medical record.  No pertanent information unless stated regarding to the chief complaint.   Review of Systems:  No headache, visual changes, nausea, vomiting, diarrhea, constipation, dizziness, abdominal pain,  skin rash, fevers, chills, night sweats, weight loss, swollen lymph nodes, body aches, joint swelling, chest pain, shortness of breath, mood changes. POSITIVE muscle aches  Objective  Blood pressure (!) 128/90, pulse 87, height 5' 2 (1.575 m), weight 185 lb (83.9 kg), SpO2 95%.   General: No apparent distress alert and oriented x3 mood and affect normal, dressed appropriately.  HEENT: Pupils equal, extraocular movements intact  Respiratory: Patient's speak in full sentences and does not appear short of breath  Mild resting tremor noted of the right upper extremity. Antalgic gait noted.  Severe tenderness  to palpation over both knees.  Instability noted with valgus and varus force.  Trace effusion of the knees noted bilaterally.  After informed written and verbal consent, patient was seated on exam table. Right knee was prepped with alcohol swab and utilizing anterolateral approach, patient's right knee space was injected with 4:1  marcaine 0.5%: Kenalog 40mg /dL. Patient tolerated the procedure well without immediate complications.  After informed written and verbal consent, patient was seated on exam table. Left knee was prepped with alcohol swab and utilizing anterolateral approach, patient's left knee space was injected with 4:1  marcaine 0.5%: Kenalog 40mg /dL. Patient tolerated the procedure well without immediate complications.    Impression and Recommendations:

## 2024-02-10 ENCOUNTER — Ambulatory Visit: Admitting: Family Medicine

## 2024-02-10 ENCOUNTER — Encounter: Payer: Self-pay | Admitting: Family Medicine

## 2024-02-10 ENCOUNTER — Other Ambulatory Visit: Payer: Self-pay | Admitting: Family Medicine

## 2024-02-10 ENCOUNTER — Ambulatory Visit (INDEPENDENT_AMBULATORY_CARE_PROVIDER_SITE_OTHER): Admitting: Family Medicine

## 2024-02-10 VITALS — BP 128/90 | HR 87 | Ht 62.0 in | Wt 185.0 lb

## 2024-02-10 DIAGNOSIS — M170 Bilateral primary osteoarthritis of knee: Secondary | ICD-10-CM

## 2024-02-10 NOTE — Assessment & Plan Note (Signed)
 Chronic problem with worsening symptoms.  Seems to be having more difficulty.  Encourage patient to continue to work on weight loss.  Does have BMI under 35 so would be a surgical candidate but still wants to avoid it.  Have done multiple different types of injections and did discuss PRP for her.  Discussed different medications as well.  Patient will follow-up again in 10 weeks

## 2024-02-10 NOTE — Patient Instructions (Addendum)
 Good to see you! Injections in knee See you again in 10 weeks

## 2024-02-20 ENCOUNTER — Other Ambulatory Visit: Payer: Self-pay | Admitting: Family Medicine

## 2024-02-20 ENCOUNTER — Other Ambulatory Visit (INDEPENDENT_AMBULATORY_CARE_PROVIDER_SITE_OTHER): Payer: Self-pay | Admitting: Otolaryngology

## 2024-02-23 ENCOUNTER — Ambulatory Visit (INDEPENDENT_AMBULATORY_CARE_PROVIDER_SITE_OTHER)

## 2024-02-23 ENCOUNTER — Ambulatory Visit (INDEPENDENT_AMBULATORY_CARE_PROVIDER_SITE_OTHER): Payer: Medicare Other

## 2024-02-23 VITALS — Ht 62.0 in | Wt 185.0 lb

## 2024-02-23 DIAGNOSIS — Z23 Encounter for immunization: Secondary | ICD-10-CM

## 2024-02-23 DIAGNOSIS — Z0000 Encounter for general adult medical examination without abnormal findings: Secondary | ICD-10-CM | POA: Diagnosis not present

## 2024-02-23 NOTE — Progress Notes (Signed)
 Subjective:   Lynn [name] is a 73 y.o. who presents for a Medicare Wellness preventive visit.  As a reminder, Annual Wellness Visits don't include a physical exam, and some assessments may be limited, especially if this visit is performed virtually. We may recommend an in-person follow-up visit with your provider if needed.  Visit Complete: Virtual I connected with  Dickey Gerome Stordahl on 02/23/24 by a audio enabled telemedicine application and verified that I am speaking with the correct person using two identifiers.  Patient Location: Home  Provider Location: Home Office  I discussed the limitations of evaluation and management by telemedicine. The patient expressed understanding and agreed to proceed.  Vital Signs: Because this visit was a virtual/telehealth visit, some criteria may be missing or patient reported. Any vitals not documented were not able to be obtained and vitals that have been documented are patient reported.  VideoDeclined- This patient declined Librarian, academic. Therefore the visit was completed with audio only.  Persons Participating in Visit: Patient.  AWV Questionnaire: Yes: Patient Medicare AWV questionnaire was completed by the patient on 02/20/2024; I have confirmed that all information answered by patient is correct and no changes since this date.  Cardiac Risk Factors include: advanced age (>54men, >83 women);dyslipidemia;obesity (BMI >30kg/m2)     Objective:    Today's Vitals   02/23/24 0929  Weight: 185 lb (83.9 kg)  Height: 5' 2 (1.575 m)   Body mass index is 33.84 kg/m.     02/23/2024    9:32 AM 02/10/2023   11:11 AM 01/04/2023    9:32 AM 01/29/2022   10:35 AM 10/25/2021    5:54 PM 12/30/2020    3:25 PM 12/26/2019    1:37 PM  Advanced Directives  Does Patient Have a Medical Advance Directive? Yes Yes Yes Yes No Yes Yes  Type of Estate agent of [place];Living will Healthcare Power of  [employer] of [place];Living will Healthcare Power of [place];Living will  Healthcare Power of [employer] of [place];Living will  Does patient want to make changes to medical advance directive? No - Patient declined        Copy of Healthcare Power of Attorney in Chart? Yes - validated most recent copy scanned in chart (See row information) Yes - validated most recent copy scanned in chart (See row information)  Yes - validated most recent copy scanned in chart (See row information)  No - copy requested Yes - validated most recent copy scanned in chart (See row information)    Current Medications (verified) Outpatient Encounter Medications as of 02/23/2024  Medication Sig   atorvastatin  (LIPITOR) 20 MG tablet TAKE 1 TABLET BY MOUTH EVERY DAY   cetirizine  (ZYRTEC ) 10 MG tablet TAKE 1 TABLET BY MOUTH EVERY DAY   fluorouracil  (EFUDEX ) 5 % cream Apply topically 2 (two) times daily.   fluticasone  (FLONASE ) 50 MCG/ACT nasal spray SPRAY 2 SPRAYS INTO EACH NOSTRIL EVERY DAY   furosemide  (LASIX ) 20 MG tablet TAKE 1 TABLET BY MOUTH EVERY DAY   gabapentin  (NEURONTIN ) 100 MG capsule TAKE 2 CAPSULES BY MOUTH 3 TIMES DAILY.   levothyroxine  (SYNTHROID ) 100 MCG tablet TAKE 1 TABLET (100 MCG TOTAL) BY MOUTH DAILY. LAST REFILL WITHOUT APPT   meloxicam  (MOBIC ) 7.5 MG tablet Take 1 tablet (7.5 mg total) by mouth daily.   Multiple Vitamin (MULTIVITAMIN) tablet Take 1 tablet by mouth daily.   Probiotic Product (PROBIOTIC-10) CHEW Chew by mouth.   promethazine  (PHENERGAN ) 25 MG  tablet TAKE 1 TABLET BY MOUTH EVERY 6 HOURS AS NEEDED FOR NAUSEA OR VOMITING.   amoxicillin -clavulanate (AUGMENTIN ) 875-125 MG tablet Take 1 tablet by mouth 2 (two) times daily. (Patient not taking: Reported on 02/23/2024)   No facility-administered encounter medications on file as of 02/23/2024.    Allergies (verified) Sulfa antibiotics and Brimonidine tartrate   History: Past Medical History:  Diagnosis  Date   Allergy    Arthritis    left knee   Basal cell carcinoma of skin 10/14/1994   Right breast   Basal cell carcinoma of skin 12/07/2011   Left inner elbow - TX p BX   Hyperlipidemia    Hypertension    Hypothyroidism    Squamous cell carcinoma of skin 12/07/2011   Left lower leg - TX p BX   Squamous cell carcinoma of skin 02/02/2017   Left anterior thigh - CX3 + 5FU   Past Surgical History:  Procedure Laterality Date   ABDOMINAL HYSTERECTOMY     c-section 1983     catract  Right 01/15/2022   CESAREAN SECTION  06/27/1981   COLONOSCOPY     EYE SURGERY  01/15/22   Cataract surgery right eye   KNEE ARTHROSCOPY     PARTIAL HYSTERECTOMY     POLYPECTOMY     TONSILLECTOMY     TUBAL LIGATION     Family History  Problem Relation Age of Onset   Colon cancer Father    Cancer Father        skin   Colon polyps Sister    Colon polyps Sister    Colon polyps Sister    Colon polyps Sister    Cancer Maternal Grandfather    Cancer Paternal Grandfather        skin   Pancreatic cancer Neg Hx    Stomach cancer Neg Hx    Esophageal cancer Neg Hx    Rectal cancer Neg Hx    Social History   Socioeconomic History   Marital status: Widowed    Spouse name: Not on file   Number of children: Not on file   Years of education: Not on file   Highest education level: Some college, no degree  Occupational History   Occupation: retired  Tobacco Use   Smoking status: Never   Smokeless tobacco: Never   Tobacco comments:    Do not and never have smoked  Vaping Use   Vaping status: Never Used  Substance and Sexual Activity   Alcohol use: Yes   Drug use: No   Sexual activity: Not Currently    Birth control/protection: Post-menopausal  Other Topics Concern   Not on file  Social History Narrative   Lives alone/2025 and 1 cat   Social Drivers of Health   Financial Resource Strain: Low Risk  (02/23/2024)   Overall Financial Resource Strain (CARDIA)    Difficulty of Paying Living  Expenses: Not hard at all  Food Insecurity: No Food Insecurity (02/23/2024)   Hunger Vital Sign    Worried About Running Out of Food in the Last Year: Never true    Ran Out of Food in the Last Year: Never true  Transportation Needs: No Transportation Needs (02/23/2024)   PRAPARE - Administrator, Civil Service (Medical): No    Lack of Transportation (Non-Medical): No  Physical Activity: Insufficiently Active (02/23/2024)   Exercise Vital Sign    Days of Exercise per Week: 3 days    Minutes of Exercise per Session: 30  min  Stress: No Stress Concern Present (02/23/2024)   [employer] of Occupational Health - Occupational Stress Questionnaire    Feeling of Stress: Not at all  Social Connections: Moderately Integrated (02/23/2024)   Social Connection and Isolation Panel    Frequency of Communication with Friends and Family: More than three times a week    Frequency of Social Gatherings with Friends and Family: More than three times a week    Attends Religious Services: More than 4 times per year    Active Member of [employer] or Organizations: Yes    Attends Banker Meetings: More than 4 times per year    Marital Status: Widowed    Tobacco Counseling Counseling given: Not Answered Tobacco comments: Do not and never have smoked    Clinical Intake:  Pre-visit preparation completed: Yes  Pain : No/denies pain     BMI - recorded: 33.84 Nutritional Status: BMI > 30  Obese Nutritional Risks: None Diabetes: No  No results found for: HGBA1C   How often do you need to have someone help you when you read instructions, pamphlets, or other written materials from your doctor or pharmacy?: 1 - Never     Information entered by :: Jeremy Ditullio, RMA   Activities of Daily Living     02/20/2024   12:24 PM  In your present state of health, do you have any difficulty performing the following activities:  Hearing? 0  Vision? 0  Difficulty concentrating or  making decisions? 0  Walking or climbing stairs? 0  Dressing or bathing? 0  Doing errands, shopping? 0  Preparing Food and eating ? N  Using the Toilet? N  In the past six months, have you accidently leaked urine? Y  Do you have problems with loss of bowel control? N  Managing your Medications? N  Managing your Finances? N  Housekeeping or managing your Housekeeping? N    Patient Care Team: Mahlon Comer BRAVO, MD as PCP - General (Family Medicine) Livingston Rigg, MD as Consulting Physician (Dermatology) Debrah Lamar BIRCH, MD (Inactive) as Consulting Physician (Gastroenterology) Pandora Cadet, [facility] as Pharmacist (Pharmacist)  I have updated your Care Teams any recent Medical Services you may have received from other providers in the past year.     Assessment:   This is a routine wellness examination for Laxmi.  Hearing/Vision screen Hearing Screening - Comments:: Denies hearing difficulties   Vision Screening - Comments:: Had cataract surgery/[facility]   Goals Addressed   None    Depression Screen     02/23/2024    9:34 AM 12/06/2023   11:05 AM 02/10/2023   11:13 AM 12/21/2022    4:36 PM 01/29/2022   10:36 AM 01/22/2022   10:47 AM 05/19/2021    1:21 PM  PHQ 2/9 Scores  PHQ - 2 Score 0 0 0 0 0 1 0  PHQ- 9 Score 1 2 1  0  4 2    Fall Risk     02/20/2024   12:24 PM 12/06/2023   11:05 AM 02/10/2023   11:13 AM 02/09/2023    1:50 PM 12/29/2022    2:07 PM  Fall Risk   Falls in the past year? 0 0 0 0 0  Number falls in past yr: 0 0 0 0 0  Injury with Fall? 0 0 0 0 0  Risk for fall due to :  No Fall Risks   No Fall Risks  Follow up Falls evaluation completed;Falls prevention discussed  Falls evaluation completed Falls evaluation completed;Education provided;Falls prevention discussed  Falls evaluation completed    MEDICARE RISK AT HOME:  Medicare Risk at Home Any stairs in or around the home?: (Patient-Rptd) Yes If so, are there any without handrails?:  (Patient-Rptd) No Home free of loose throw rugs in walkways, pet beds, electrical cords, etc?: (Patient-Rptd) Yes Adequate lighting in your home to reduce risk of falls?: (Patient-Rptd) Yes Life alert?: (Patient-Rptd) No Use of a cane, walker or w/c?: (Patient-Rptd) No Grab bars in the bathroom?: (Patient-Rptd) Yes Shower chair or bench in shower?: (Patient-Rptd) No Elevated toilet seat or a handicapped toilet?: (Patient-Rptd) No  TIMED UP AND GO:  Was the test performed?  No  Cognitive Function: Declined/Normal: No cognitive concerns noted by patient or family. Patient alert, oriented, able to answer questions appropriately and recall recent events. No signs of memory loss or confusion.        02/10/2023   11:09 AM 01/29/2022   10:42 AM 12/26/2019    1:42 PM  6CIT Screen  What Year? 0 points  0 points  What month? 0 points  0 points  What time? 0 points 0 points 0 points  Count back from 20 0 points 0 points 0 points  Months in reverse 0 points 0 points 0 points  Repeat phrase 0 points 0 points 0 points  Total Score 0 points  0 points    Immunizations Immunization History  Administered Date(s) Administered   Fluad Quad(high Dose 65+) 02/17/2019, 03/11/2020, 03/17/2021   Fluad Trivalent(High Dose 65+) 03/09/2023   INFLUENZA, HIGH DOSE SEASONAL PF 03/07/2018   Influenza, Seasonal, Injecte, Preservative Fre 03/08/2012, 03/13/2013, 03/15/2014, 03/19/2015   Influenza,inj,Quad PF,6+ Mos 04/02/2016, 02/02/2017, 01/22/2022   Moderna Covid-19 Vaccine Bivalent Booster 71yrs & up 04/21/2021   Moderna Sars-Covid-2 Vaccination 07/26/2019, 08/17/2019, 05/23/2020   Pneumococcal Conjugate-13 10/24/2015   Pneumococcal Polysaccharide-23 06/09/2017   Td 07/25/2018   Tdap 10/24/2011   Zoster Recombinant(Shingrix) 02/03/2018, 06/22/2018   Zoster, Live 10/24/2014    Screening Tests Health Maintenance  Topic Date Due   Influenza Vaccine  01/07/2024   Colonoscopy  02/23/2024   Mammogram   03/11/2024   Medicare Annual Wellness (AWV)  02/22/2025   DTaP/Tdap/Td (3 - Td or Tdap) 07/25/2028   Pneumococcal Vaccine: 50+ Years  Completed   DEXA SCAN  Completed   Hepatitis C Screening  Completed   Zoster Vaccines- Shingrix  Completed   HPV VACCINES  Aged Out   Meningococcal B Vaccine  Aged Out   COVID-19 Vaccine  Discontinued    Health Maintenance Items Addressed: Mammogram scheduled, See Nurse Notes at the end of this note  Additional Screening:  Vision Screening: Recommended annual ophthalmology exams for early detection of glaucoma and other disorders of the eye. Is the patient up to date with their annual eye exam?  No  Who is the provider or what is the name of the office in which the patient attends annual eye exams? [facility]  Dental Screening: Recommended annual dental exams for proper oral hygiene  Community Resource Referral / Chronic Care Management: CRR required this visit?  No   CCM required this visit?  No   Plan:    I have personally reviewed and noted the following in the patient's chart:   Medical and social history Use of alcohol, tobacco or illicit drugs  Current medications and supplements including opioid prescriptions. Patient is not currently taking opioid prescriptions. Functional ability and status Nutritional status Physical activity Advanced  directives List of other physicians Hospitalizations, surgeries, and ER visits in previous 12 months Vitals Screenings to include cognitive, depression, and falls Referrals and appointments  In addition, I have reviewed and discussed with patient certain preventive protocols, quality metrics, and best practice recommendations. A written personalized care plan for preventive services as well as general preventive health recommendations were provided to patient.   Ruthy Forry L Mallie Linnemann, CMA   02/23/2024   After Visit Summary: (MyChart) Due to this being a telephonic visit, the after visit  summary with patients personalized plan was offered to patient via MyChart   Notes: Patient is due for a flu vaccine.  She stated that she is scheduled for a mammogram.  Patient is due for a colonoscopy and stated that she would call to get scheduled for that also.  She had no other concerns to address today.

## 2024-02-23 NOTE — Progress Notes (Signed)
 Lynn [name] is a 73 y.o. female presents to the office today for Flu Vaccine per physician's orders. Injection was administered Intramuscular Left deltoid.   Patient's next injection due n/a, appt made? not applicable  [employer]

## 2024-02-23 NOTE — Patient Instructions (Signed)
 Advance directive discussed with you today. I have provided a copy for you to complete at home and have notarized. Once this is complete please bring a copy in to our office so we can scan it into your chart. Lynn [name],  Thank you for taking the time for your Medicare Wellness Visit. I appreciate your continued commitment to your health goals. Please review the care plan we discussed, and feel free to reach out if I can assist you further.  Medicare recommends these wellness visits once per year to help you and your care team stay ahead of potential health issues. These visits are designed to focus on prevention, allowing your provider to concentrate on managing your acute and chronic conditions during your regular appointments.  Please note that Annual Wellness Visits do not include a physical exam. Some assessments may be limited, especially if the visit was conducted virtually. If needed, we may recommend a separate in-person follow-up with your provider.  Ongoing Care Seeing your primary care provider every 3 to 6 months helps us  monitor your health and provide consistent, personalized care. Last office visit on 12/06/2023.  You are due for a Flu vaccine.    Referrals If a referral was made during today's visit and you haven't received any updates within two weeks, please contact the referred provider directly to check on the status.  Recommended Screenings:  Health Maintenance  Topic Date Due   Flu Shot  01/07/2024   Medicare Annual Wellness Visit  02/10/2024   Colon Cancer Screening  02/23/2024   Breast Cancer Screening  03/11/2024   DTaP/Tdap/Td vaccine (3 - Td or Tdap) 07/25/2028   Pneumococcal Vaccine for age over 74  Completed   DEXA scan (bone density measurement)  Completed   Hepatitis C Screening  Completed   Zoster (Shingles) Vaccine  Completed   HPV Vaccine  Aged Out   Meningitis B Vaccine  Aged Out   COVID-19 Vaccine  Discontinued       02/23/2024    9:32 AM  Advanced  Directives  Does Patient Have a Medical Advance Directive? Yes  Type of Estate agent of [place];Living will  Does patient want to make changes to medical advance directive? No - Patient declined  Copy of Healthcare Power of Attorney in Chart? Yes - validated most recent copy scanned in chart (See row information)   Advance Care Planning is important because it: Ensures you receive medical care that aligns with your values, goals, and preferences. Provides guidance to your family and loved ones, reducing the emotional burden of decision-making during critical moments.  Vision: Annual vision screenings are recommended for early detection of glaucoma, cataracts, and diabetic retinopathy. These exams can also reveal signs of chronic conditions such as diabetes and high blood pressure.  Dental: Annual dental screenings help detect early signs of oral cancer, gum disease, and other conditions linked to overall health, including heart disease and diabetes.  Please see the attached documents for additional preventive care recommendations.

## 2024-03-01 ENCOUNTER — Ambulatory Visit: Admitting: Dermatology

## 2024-03-02 ENCOUNTER — Other Ambulatory Visit: Payer: Self-pay | Admitting: Family Medicine

## 2024-03-02 NOTE — Telephone Encounter (Signed)
 Patient needs appt. Last TSH was checked in 2023

## 2024-03-02 NOTE — Telephone Encounter (Signed)
 Pt refused appt at this time and will call back. Informed pt refill would only be for 30days and without an appointment this can not longer be filled.

## 2024-03-06 ENCOUNTER — Other Ambulatory Visit: Payer: Self-pay | Admitting: Family Medicine

## 2024-03-06 DIAGNOSIS — R42 Dizziness and giddiness: Secondary | ICD-10-CM

## 2024-03-07 NOTE — Telephone Encounter (Signed)
 Please advise on refill.

## 2024-03-10 ENCOUNTER — Ambulatory Visit: Admitting: Family Medicine

## 2024-03-17 ENCOUNTER — Encounter: Payer: Self-pay | Admitting: Internal Medicine

## 2024-03-20 ENCOUNTER — Other Ambulatory Visit (HOSPITAL_BASED_OUTPATIENT_CLINIC_OR_DEPARTMENT_OTHER): Payer: Self-pay | Admitting: Family Medicine

## 2024-03-20 DIAGNOSIS — Z1231 Encounter for screening mammogram for malignant neoplasm of breast: Secondary | ICD-10-CM

## 2024-03-21 ENCOUNTER — Ambulatory Visit (HOSPITAL_BASED_OUTPATIENT_CLINIC_OR_DEPARTMENT_OTHER)
Admission: RE | Admit: 2024-03-21 | Discharge: 2024-03-21 | Disposition: A | Discharge status: Home / Self Care | Source: Ambulatory Visit | Attending: Family Medicine | Admitting: Family Medicine

## 2024-03-21 ENCOUNTER — Encounter (HOSPITAL_BASED_OUTPATIENT_CLINIC_OR_DEPARTMENT_OTHER): Payer: Self-pay | Admitting: Radiology

## 2024-03-21 DIAGNOSIS — Z1231 Encounter for screening mammogram for malignant neoplasm of breast: Secondary | ICD-10-CM | POA: Diagnosis not present

## 2024-04-05 ENCOUNTER — Ambulatory Visit: Admitting: Dermatology

## 2024-04-05 ENCOUNTER — Encounter: Payer: Self-pay | Admitting: Dermatology

## 2024-04-05 DIAGNOSIS — L570 Actinic keratosis: Secondary | ICD-10-CM

## 2024-04-05 DIAGNOSIS — Z872 Personal history of diseases of the skin and subcutaneous tissue: Secondary | ICD-10-CM

## 2024-04-05 DIAGNOSIS — L738 Other specified follicular disorders: Secondary | ICD-10-CM | POA: Diagnosis not present

## 2024-04-05 NOTE — Patient Instructions (Signed)

## 2024-04-05 NOTE — Progress Notes (Signed)
   Follow-Up Visit   Subjective  Ettie Krontz is a 73 y.o. female who presents for the following: AK F/u  Patient present today for follow up visit for Actinic Keratosis follow up on the mid glabella after treating with efudex . Patient was last evaluated on 10/25/2023. At this time patient was reacting to efudex .  Patient has been using efudex  nightly since her previous appointment. Patient reports sxs are unchanged. Patient denies medication changes.  The following portions of the chart were reviewed this encounter and updated as appropriate: medications, allergies, medical history  Review of Systems:  No other skin or systemic complaints except as noted in HPI or Assessment and Plan.  Objective  Well appearing patient in no apparent distress; mood and affect are within normal limits.  A focused examination was performed of the following areas: Face. Hands  Relevant exam findings are noted in the Assessment and Plan.  Left Forehead, Left Hand - Posterior, Right Hand - Posterior Erythematous thin papules/macules with gritty scale.   Assessment & Plan   HISTORY OF PRECANCEROUS ACTINIC KERATOSIS- patient used efudex  on the forehead - site(s) of PreCancerous Actinic Keratosis clear today. - these may recur and new lesions may form requiring treatment to prevent transformation into skin cancer - observe for new or changing spots and contact [facility] for appointment if occur - photoprotection with sun protective clothing; sunglasses and broad spectrum sunscreen with SPF of at least 30 + and frequent self skin exams recommended - yearly exams by a dermatologist recommended for persons with history of PreCancerous Actinic Keratoses Patient advised to discontinue efudex .   Sebaceous Hyperplasia - Small yellow papules with a central dell - Benign-appearing - Observe. Call for changes.  - Advised patient to consult a cosmetic dermatologist for removal. - Discussed that  we do offer removal in a 15 minute appointment spot with Dr. Alm. Discussed this is a cosmetic procedure so it is $100 at the time of scheduling and $100 a the time of the appointment.  AK (ACTINIC KERATOSIS) (3) Left Forehead, Left Hand - Posterior, Right Hand - Posterior Destruction of lesion - Left Forehead, Left Hand - Posterior Complexity: simple   Destruction method: cryotherapy   Informed consent: discussed and consent obtained   Timeout:  patient name, date of birth, surgical site, and procedure verified Procedure prep:  Patient was prepped and draped in usual sterile fashion Lesion destroyed using liquid nitrogen: Yes   Region frozen until ice ball extended beyond lesion: Yes   Outcome: patient tolerated procedure well with no complications   Post-procedure details: wound care instructions given    SEBACEOUS HYPERPLASIA OF FACE    Return in about 6 months (around 10/04/2024) for FBSE.  LILLETTE Rollene Gobble, RN, am acting as scribe for RUFUS CHRISTELLA HOLY, MD .   Documentation: I have reviewed the above documentation for accuracy and completeness, and I agree with the above.  RUFUS CHRISTELLA HOLY, MD

## 2024-04-07 ENCOUNTER — Other Ambulatory Visit: Payer: Self-pay | Admitting: Family Medicine

## 2024-04-19 NOTE — Progress Notes (Signed)
 [name] [address] [place] [address] Phone: [phone] Subjective:   ISusannah Gully, am serving as a scribe for Dr. Arthea Claudene.  I'm seeing this patient by the request  of:  Mahlon Comer BRAVO, MD  CC: Bilateral knee pain  YEP:Dlagzrupcz  02/10/2024 Chronic problem with worsening symptoms.  Seems to be having more difficulty.  Encourage patient to continue to work on weight loss.  Does have BMI under 35 so would be a surgical candidate but still wants to avoid it.  Have done multiple different types of injections and did discuss PRP for her.  Discussed different medications as well.  Patient will follow-up again in 10 weeks      Update 04/20/2024 [place] Ke is a 73 y.o. female coming in with complaint of B knee pain. Patient states knees are meh. Also having R side sciatic pain. No other concerns.      Past Medical History:  Diagnosis Date   Allergy    Arthritis    left knee   Basal cell carcinoma of skin 10/14/1994   Right breast   Basal cell carcinoma of skin 12/07/2011   Left inner elbow - TX p BX   Hyperlipidemia    Hypertension    Hypothyroidism    Squamous cell carcinoma of skin 12/07/2011   Left lower leg - TX p BX   Squamous cell carcinoma of skin 02/02/2017   Left anterior thigh - CX3 + 5FU   Past Surgical History:  Procedure Laterality Date   ABDOMINAL HYSTERECTOMY     c-section 1983     catract  Right 01/15/2022   CESAREAN SECTION  06/27/1981   COLONOSCOPY     EYE SURGERY  01/15/22   Cataract surgery right eye   KNEE ARTHROSCOPY     PARTIAL HYSTERECTOMY     POLYPECTOMY     TONSILLECTOMY     TUBAL LIGATION     Social History   Socioeconomic History   Marital status: Widowed    Spouse name: Not on file   Number of children: Not on file   Years of education: Not on file   Highest education level: Some college, no degree  Occupational History   Occupation: retired  Tobacco Use   Smoking  status: Never   Smokeless tobacco: Never   Tobacco comments:    Do not and never have smoked  Vaping Use   Vaping status: Never Used  Substance and Sexual Activity   Alcohol use: Yes   Drug use: No   Sexual activity: Not Currently    Birth control/protection: Post-menopausal  Other Topics Concern   Not on file  Social History Narrative   Lives alone/2025 and 1 cat   Social Drivers of Health   Financial Resource Strain: Low Risk  (02/23/2024)   Overall Financial Resource Strain (CARDIA)    Difficulty of Paying Living Expenses: Not hard at all  Food Insecurity: No Food Insecurity (02/23/2024)   Hunger Vital Sign    Worried About Running Out of Food in the Last Year: Never true    Ran Out of Food in the Last Year: Never true  Transportation Needs: No Transportation Needs (02/23/2024)   PRAPARE - Administrator, Civil Service (Medical): No    Lack of Transportation (Non-Medical): No  Physical Activity: Insufficiently Active (02/23/2024)   Exercise Vital Sign    Days of Exercise per Week: 3 days    Minutes of Exercise  per Session: 30 min  Stress: No Stress Concern Present (02/23/2024)   [employer] of Occupational Health - Occupational Stress Questionnaire    Feeling of Stress: Not at all  Social Connections: Moderately Integrated (02/23/2024)   Social Connection and Isolation Panel    Frequency of Communication with Friends and Family: More than three times a week    Frequency of Social Gatherings with Friends and Family: More than three times a week    Attends Religious Services: More than 4 times per year    Active Member of [employer] or Organizations: Yes    Attends Banker Meetings: More than 4 times per year    Marital Status: Widowed   Allergies  Allergen Reactions   Sulfa Antibiotics Nausea Only and Nausea And Vomiting   Brimonidine Tartrate    Family History  Problem Relation Age of Onset   Colon cancer Father    Cancer Father        skin    Colon polyps Sister    Colon polyps Sister    Colon polyps Sister    Colon polyps Sister    Cancer Maternal Grandfather    Cancer Paternal Grandfather        skin   Pancreatic cancer Neg Hx    Stomach cancer Neg Hx    Esophageal cancer Neg Hx    Rectal cancer Neg Hx     Current Outpatient Medications (Endocrine & Metabolic):    levothyroxine  (SYNTHROID ) 100 MCG tablet, TAKE 1 TABLET (100 MCG TOTAL) BY MOUTH DAILY. LAST REFILL WITHOUT APPT  Current Outpatient Medications (Cardiovascular):    atorvastatin  (LIPITOR) 20 MG tablet, TAKE 1 TABLET BY MOUTH EVERY DAY   furosemide  (LASIX ) 20 MG tablet, TAKE 1 TABLET BY MOUTH EVERY DAY  Current Outpatient Medications (Respiratory):    cetirizine  (ZYRTEC ) 10 MG tablet, TAKE 1 TABLET BY MOUTH EVERY DAY   fluticasone  (FLONASE ) 50 MCG/ACT nasal spray, SPRAY 2 SPRAYS INTO EACH NOSTRIL EVERY DAY   promethazine  (PHENERGAN ) 25 MG tablet, TAKE 1 TABLET BY MOUTH EVERY 6 HOURS AS NEEDED FOR NAUSEA OR VOMITING.  Current Outpatient Medications (Analgesics):    meloxicam  (MOBIC ) 7.5 MG tablet, Take 1 tablet (7.5 mg total) by mouth daily. (Patient not taking: Reported on 04/05/2024)   Current Outpatient Medications (Other):    fluorouracil  (EFUDEX ) 5 % cream, Apply topically 2 (two) times daily.   gabapentin  (NEURONTIN ) 100 MG capsule, TAKE 2 CAPSULES BY MOUTH 3 TIMES DAILY.   Multiple Vitamin (MULTIVITAMIN) tablet, Take 1 tablet by mouth daily.   Probiotic Product (PROBIOTIC-10) CHEW, Chew by mouth.   Reviewed prior external information including notes and imaging from  primary care provider As well as notes that were available from care everywhere and other healthcare systems.  Past medical history, social, surgical and family history all reviewed in electronic medical record.  No pertanent information unless stated regarding to the chief complaint.   Review of Systems:  No headache, visual changes, nausea, vomiting, diarrhea, constipation,  dizziness, abdominal pain, skin rash, fevers, chills, night sweats, weight loss, swollen lymph nodes, body aches, joint swelling, chest pain, shortness of breath, mood changes. POSITIVE muscle aches  Objective  Blood pressure (!) 132/92, pulse 95, height 5' 2 (1.575 m), weight 186 lb (84.4 kg), SpO2 98%.   General: No apparent distress alert and oriented x3 mood and affect normal, dressed appropriately.  HEENT: Pupils equal, extraocular movements intact  Respiratory: Patient's speak in full sentences and does not appear short of  breath  Cardiovascular: No lower extremity edema, non tender, no erythema  Bilateral knee exam shows arthritic changes noted bilaterally.  Trace effusion noted.  Limited range of motion of the knees bilaterally.  Instability with valgus and varus force. Tender to palpation in the lower back.  Positive straight leg test left greater than right.  Patient does have some atrophy of the gluteal tendons bilaterally.  Worsening pain with any extension of the back.  More of a shuffling gait.   After informed written and verbal consent, patient was seated on exam table. Right knee was prepped with alcohol swab and utilizing anterolateral approach, patient's right knee space was injected with 4:1  marcaine 0.5%: Kenalog 40mg /dL. Patient tolerated the procedure well without immediate complications.  After informed written and verbal consent, patient was seated on exam table. Left knee was prepped with alcohol swab and utilizing anterolateral approach, patient's left knee space was injected with 4:1  marcaine 0.5%: Kenalog 40mg /dL. Patient tolerated the procedure well without immediate complications. Impression and Recommendations:      The above documentation has been reviewed and is accurate and complete Merrik Puebla M Philomene Haff, DO

## 2024-04-20 ENCOUNTER — Encounter: Payer: Self-pay | Admitting: Family Medicine

## 2024-04-20 ENCOUNTER — Ambulatory Visit: Admitting: Family Medicine

## 2024-04-20 VITALS — BP 132/92 | HR 95 | Ht 62.0 in | Wt 186.0 lb

## 2024-04-20 DIAGNOSIS — M170 Bilateral primary osteoarthritis of knee: Secondary | ICD-10-CM

## 2024-04-20 DIAGNOSIS — M545 Low back pain, unspecified: Secondary | ICD-10-CM | POA: Diagnosis not present

## 2024-04-20 DIAGNOSIS — M51362 Other intervertebral disc degeneration, lumbar region with discogenic back pain and lower extremity pain: Secondary | ICD-10-CM | POA: Diagnosis not present

## 2024-04-20 NOTE — Assessment & Plan Note (Signed)
 Lumbar radiculopathy still occurring. Failed all other conservative therapy.  Starting to affect daily activities, having constant radicular symptoms at this time.  Do feel at this point that a MRI is necessary to further evaluate especially but with patient's x-ray showing a fairly significant spinal listhesis.  Follow-up after imaging to discuss different treatment options.

## 2024-04-20 NOTE — Assessment & Plan Note (Signed)
 Chronic problem with exacerbation.  Discussed icing regimen and home exercises, increase activity as tolerated.  Patient still wants to avoid any type of surgical intervention.  Does have BMI low under 35 which congratulated patient that we would be a surgical candidate if she would like.  Patient wants to hold at this time and would follow-up again in 10 to 12 weeks

## 2024-04-20 NOTE — Patient Instructions (Addendum)
 Injected both knees today MRI lumbar See me again in 10-12 weeks

## 2024-04-24 ENCOUNTER — Ambulatory Visit

## 2024-04-24 DIAGNOSIS — M545 Low back pain, unspecified: Secondary | ICD-10-CM | POA: Diagnosis not present

## 2024-04-24 DIAGNOSIS — M5116 Intervertebral disc disorders with radiculopathy, lumbar region: Secondary | ICD-10-CM | POA: Diagnosis not present

## 2024-04-24 DIAGNOSIS — M4807 Spinal stenosis, lumbosacral region: Secondary | ICD-10-CM | POA: Diagnosis not present

## 2024-04-24 DIAGNOSIS — M48061 Spinal stenosis, lumbar region without neurogenic claudication: Secondary | ICD-10-CM | POA: Diagnosis not present

## 2024-04-24 DIAGNOSIS — M4316 Spondylolisthesis, lumbar region: Secondary | ICD-10-CM | POA: Diagnosis not present

## 2024-04-25 ENCOUNTER — Other Ambulatory Visit: Payer: Self-pay

## 2024-04-25 ENCOUNTER — Ambulatory Visit

## 2024-04-25 VITALS — Ht 62.0 in | Wt 185.0 lb

## 2024-04-25 DIAGNOSIS — Z860100 Personal history of colon polyps, unspecified: Secondary | ICD-10-CM

## 2024-04-25 DIAGNOSIS — Z800 Family history of malignant neoplasm of digestive organs: Secondary | ICD-10-CM

## 2024-04-25 MED ORDER — NA SULFATE-K SULFATE-MG SULF 17.5-3.13-1.6 GM/177ML PO SOLN
1.0000 | Freq: Once | ORAL | 0 refills | Status: AC
Start: 2024-04-25 — End: 2024-04-25

## 2024-04-25 NOTE — Progress Notes (Signed)
 Denies allergies to eggs or soy products. Denies complication of anesthesia or sedation. Denies use of weight loss medication. Denies use of O2.   Emmi instructions given for colonoscopy.

## 2024-04-28 ENCOUNTER — Ambulatory Visit: Payer: Self-pay | Admitting: Family Medicine

## 2024-04-28 ENCOUNTER — Other Ambulatory Visit: Payer: Self-pay

## 2024-04-28 DIAGNOSIS — M5416 Radiculopathy, lumbar region: Secondary | ICD-10-CM

## 2024-05-02 ENCOUNTER — Other Ambulatory Visit: Payer: Self-pay | Admitting: Family Medicine

## 2024-05-09 ENCOUNTER — Encounter: Payer: Self-pay | Admitting: Internal Medicine

## 2024-05-09 ENCOUNTER — Ambulatory Visit: Admitting: Internal Medicine

## 2024-05-09 VITALS — BP 153/86 | HR 61 | Temp 97.0°F | Resp 16 | Ht 62.0 in | Wt 185.0 lb

## 2024-05-09 DIAGNOSIS — Z860100 Personal history of colon polyps, unspecified: Secondary | ICD-10-CM | POA: Diagnosis not present

## 2024-05-09 DIAGNOSIS — K5730 Diverticulosis of large intestine without perforation or abscess without bleeding: Secondary | ICD-10-CM

## 2024-05-09 DIAGNOSIS — E785 Hyperlipidemia, unspecified: Secondary | ICD-10-CM | POA: Diagnosis not present

## 2024-05-09 DIAGNOSIS — Z800 Family history of malignant neoplasm of digestive organs: Secondary | ICD-10-CM | POA: Diagnosis not present

## 2024-05-09 DIAGNOSIS — K648 Other hemorrhoids: Secondary | ICD-10-CM

## 2024-05-09 DIAGNOSIS — I10 Essential (primary) hypertension: Secondary | ICD-10-CM | POA: Diagnosis not present

## 2024-05-09 DIAGNOSIS — F419 Anxiety disorder, unspecified: Secondary | ICD-10-CM | POA: Diagnosis not present

## 2024-05-09 DIAGNOSIS — Z1211 Encounter for screening for malignant neoplasm of colon: Secondary | ICD-10-CM

## 2024-05-09 DIAGNOSIS — D120 Benign neoplasm of cecum: Secondary | ICD-10-CM | POA: Diagnosis not present

## 2024-05-09 DIAGNOSIS — E039 Hypothyroidism, unspecified: Secondary | ICD-10-CM | POA: Diagnosis not present

## 2024-05-09 DIAGNOSIS — D123 Benign neoplasm of transverse colon: Secondary | ICD-10-CM

## 2024-05-09 DIAGNOSIS — Z860101 Personal history of adenomatous and serrated colon polyps: Secondary | ICD-10-CM

## 2024-05-09 MED ORDER — SODIUM CHLORIDE 0.9 % IV SOLN: 500.0000 mL | Freq: Once | INTRAVENOUS | Status: DC

## 2024-05-09 NOTE — Patient Instructions (Signed)
 Thank you for letting us  care for your healthcare needs today! Please see handouts regarding Polyps, Hemorrhoids & Diverticulosis.  - Resume previous diet - Continue present medications - Await pathology results  YOU HAD AN ENDOSCOPIC PROCEDURE TODAY AT THE [facility]:   Refer to the procedure report that was given to you for any specific questions about what was found during the examination.  If the procedure report does not answer your questions, please call your gastroenterologist to clarify.  If you requested that your care partner not be given the details of your procedure findings, then the procedure report has been included in a sealed envelope for you to review at your convenience later.  YOU SHOULD EXPECT: Some feelings of bloating in the abdomen. Passage of more gas than usual.  Walking can help get rid of the air that was put into your GI tract during the procedure and reduce the bloating. If you had a lower endoscopy (such as a colonoscopy or flexible sigmoidoscopy) you may notice spotting of blood in your stool or on the toilet paper. If you underwent a bowel prep for your procedure, you may not have a normal bowel movement for a few days.  Please Note:  You might notice some irritation and congestion in your nose or some drainage.  This is from the oxygen used during your procedure.  There is no need for concern and it should clear up in a day or so.  SYMPTOMS TO REPORT IMMEDIATELY:  Following lower endoscopy (colonoscopy or flexible sigmoidoscopy):  Excessive amounts of blood in the stool  Significant tenderness or worsening of abdominal pains  Swelling of the abdomen that is new, acute  Fever of 100F or higher  For urgent or emergent issues, a gastroenterologist can be reached at any hour by calling [phone]. Do not use MyChart messaging for urgent concerns.    DIET:  We do recommend a small meal at first, but then you may proceed to your regular diet.   Drink plenty of fluids but you should avoid alcoholic beverages for 24 hours.  ACTIVITY:  You should plan to take it easy for the rest of today and you should NOT DRIVE or use heavy machinery until tomorrow (because of the sedation medicines used during the test).    FOLLOW UP: Our staff will call the number listed on your records the next business day following your procedure.  We will call around 7:15- 8:00 am to check on you and address any questions or concerns that you may have regarding the information given to you following your procedure. If we do not reach you, we will leave a message.     If any biopsies were taken you will be contacted by phone or by letter within the next 1-3 weeks.  Please call us  at [phone] if you have not heard about the biopsies in 3 weeks.    SIGNATURES/CONFIDENTIALITY: You and/or your care partner have signed paperwork which will be entered into your electronic medical record.  These signatures attest to the fact that that the information above on your After Visit Summary has been reviewed and is understood.  Full responsibility of the confidentiality of this discharge information lies with you and/or your care-partner.

## 2024-05-09 NOTE — Progress Notes (Signed)
 Pt's states no medical or surgical changes since previsit or office visit.

## 2024-05-09 NOTE — Progress Notes (Signed)
 A/o x 3, VSS, good SR's, pleased with anesthesia, report to RN

## 2024-05-09 NOTE — Op Note (Signed)
 [facility] Patient Name: Lynn [name] Procedure Date: 05/09/2024 8:32 AM MRN: [id number] Endoscopist: Gordy CHRISTELLA Starch , MD, [id number] Age: 73 Referring MD:  Date of Birth: 04-26-1951 Gender: Female Account #: [id number] Procedure:                Colonoscopy Indications:              High risk colon cancer surveillance: Personal                            history of non-advanced adenomas, Family history of                            colon cancer in a first-degree relative; last                            colonoscopy: September 2020 (TA x 2), 2015 (no                            polyps), 2009 (TA x 1) Medicines:                Monitored Anesthesia Care Procedure:                Pre-Anesthesia Assessment:                           - Prior to the procedure, a History and Physical                            was performed, and patient medications and                            allergies were reviewed. The patient's tolerance of                            previous anesthesia was also reviewed. The risks                            and benefits of the procedure and the sedation                            options and risks were discussed with the patient.                            All questions were answered, and informed consent                            was obtained. Prior Anticoagulants: The patient has                            taken no anticoagulant or antiplatelet agents. ASA                            Grade Assessment: II - A patient with mild systemic  disease. After reviewing the risks and benefits,                            the patient was deemed in satisfactory condition to                            undergo the procedure.                           After obtaining informed consent, the colonoscope                            was passed under direct vision. Throughout the                            procedure, the patient's blood pressure, pulse, and                             oxygen saturations were monitored continuously. The                            Olympus Scope [id number] was introduced through the                            anus and advanced to the cecum, identified by                            appendiceal orifice and ileocecal valve. The                            colonoscopy was performed without difficulty. The                            patient tolerated the procedure well. The quality                            of the bowel preparation was good. The ileocecal                            valve, appendiceal orifice, and rectum were                            photographed. Scope In: 8:46:59 AM Scope Out: 9:03:01 AM Scope Withdrawal Time: 0 hours 12 minutes 34 seconds  Total Procedure Duration: 0 hours 16 minutes 2 seconds  Findings:                 The digital rectal exam was normal.                           Two sessile polyps were found in the cecum. The                            polyps were 3 to 7 mm in size. These polyps were  removed with a cold snare. Resection and retrieval                            were complete.                           Two sessile polyps were found in the transverse                            colon. The polyps were 3 to 4 mm in size. These                            polyps were removed with a cold snare. Resection                            and retrieval were complete.                           Multiple medium-mouthed and small-mouthed                            diverticula were found in the sigmoid colon and                            descending colon.                           Internal hemorrhoids were found during                            retroflexion. The hemorrhoids were medium-sized. Complications:            No immediate complications. Estimated Blood Loss:     Estimated blood loss was minimal. Impression:               - Two 3 to 7 mm polyps in the cecum,  removed with a                            cold snare. Resected and retrieved.                           - Two 3 to 4 mm polyps in the transverse colon,                            removed with a cold snare. Resected and retrieved.                           - Moderate diverticulosis in the sigmoid colon and                            in the descending colon.                           - Small internal hemorrhoids. Recommendation:           - Patient  has a contact number available for                            emergencies. The signs and symptoms of potential                            delayed complications were discussed with the                            patient. Return to normal activities tomorrow.                            Written discharge instructions were provided to the                            patient.                           - Resume previous diet.                           - Continue present medications.                           - Await pathology results.                           - Repeat colonoscopy is recommended for                            surveillance. The colonoscopy date will be                            determined after pathology results from today's                            exam become available for review. Gordy CHRISTELLA Starch, MD 05/09/2024 9:08:07 AM This report has been signed electronically.

## 2024-05-09 NOTE — Progress Notes (Signed)
 Called to room to assist during endoscopic procedure.  Patient ID and intended procedure confirmed with present staff. Received instructions for my participation in the procedure from the performing physician.

## 2024-05-09 NOTE — Progress Notes (Signed)
 GASTROENTEROLOGY PROCEDURE H&P NOTE   Primary Care Physician: Mahlon Comer BRAVO, MD    Reason for Procedure:   Hx of polyps, fam hx of colon cancer  Plan:    colonoscopy  Patient is appropriate for endoscopic procedure(s) in the ambulatory ([facility]) setting.  The nature of the procedure, as well as the risks, benefits, and alternatives were carefully and thoroughly reviewed with the patient. Ample time for discussion and questions allowed.  All questions were answered. The patient understood, was satisfied, and agreed with the plan to proceed.    HPI: Lynn [name] is a 73 y.o. female who presents for colonoscopy.  Medical history as below.  Tolerated the prep.  No recent chest pain or shortness of breath.  No abdominal pain today.  Past Medical History:  Diagnosis Date   Allergy    Anxiety    Arthritis    left knee   Basal cell carcinoma of skin 10/14/1994   Right breast   Basal cell carcinoma of skin 12/07/2011   Left inner elbow - TX p BX   Cataract    GERD (gastroesophageal reflux disease)    Hyperlipidemia    Hypertension    Hypothyroidism    Squamous cell carcinoma of skin 12/07/2011   Left lower leg - TX p BX   Squamous cell carcinoma of skin 02/02/2017   Left anterior thigh - CX3 + 5FU    Past Surgical History:  Procedure Laterality Date   ABDOMINAL HYSTERECTOMY     c-section 1983     catract  Right 01/15/2022   CESAREAN SECTION  06/27/1981   COLONOSCOPY     EYE SURGERY  01/15/22   Cataract surgery right eye   KNEE ARTHROSCOPY     PARTIAL HYSTERECTOMY     POLYPECTOMY     TONSILLECTOMY     TUBAL LIGATION      Prior to Admission medications   Medication Sig Start Date End Date Taking? Authorizing Provider  atorvastatin  (LIPITOR) 20 MG tablet TAKE 1 TABLET BY MOUTH EVERY DAY 02/03/24  Yes Tabori, Katherine E, MD  fluticasone  (FLONASE ) 50 MCG/ACT nasal spray SPRAY 2 SPRAYS INTO EACH NOSTRIL EVERY DAY 07/13/23  Yes Tabori, Katherine E, MD  furosemide   (LASIX ) 20 MG tablet TAKE 1 TABLET BY MOUTH EVERY DAY 02/21/24  Yes Tabori, Katherine E, MD  gabapentin  (NEURONTIN ) 100 MG capsule TAKE 2 CAPSULES BY MOUTH 3 TIMES DAILY. 04/08/24  Yes Claudene Arthea HERO, DO  levothyroxine  (SYNTHROID ) 100 MCG tablet TAKE 1 TABLET (100 MCG TOTAL) BY MOUTH DAILY. LAST REFILL WITHOUT APPT 05/02/24  Yes Tabori, Katherine E, MD  promethazine  (PHENERGAN ) 25 MG tablet TAKE 1 TABLET BY MOUTH EVERY 6 HOURS AS NEEDED FOR NAUSEA OR VOMITING. 01/16/22  Yes Tabori, Katherine E, MD  cetirizine  (ZYRTEC ) 10 MG tablet TAKE 1 TABLET BY MOUTH EVERY DAY 02/21/24   Soldatova, Liuba, MD  fluorouracil  (EFUDEX ) 5 % cream Apply topically 2 (two) times daily. 08/24/23   Paci, Karina M, MD  meloxicam  (MOBIC ) 7.5 MG tablet Take 1 tablet (7.5 mg total) by mouth daily. 12/06/23   Levora Reyes SAUNDERS, MD  Multiple Vitamin (MULTIVITAMIN) tablet Take 1 tablet by mouth daily.    [provider]  Probiotic Product (PROBIOTIC-10) CHEW Chew by mouth.    [provider]    Current Outpatient Medications  Medication Sig Dispense Refill   atorvastatin  (LIPITOR) 20 MG tablet TAKE 1 TABLET BY MOUTH EVERY DAY 90 tablet 3   fluticasone  (FLONASE ) 50  MCG/ACT nasal spray SPRAY 2 SPRAYS INTO EACH NOSTRIL EVERY DAY 48 mL 1   furosemide  (LASIX ) 20 MG tablet TAKE 1 TABLET BY MOUTH EVERY DAY 90 tablet 1   gabapentin  (NEURONTIN ) 100 MG capsule TAKE 2 CAPSULES BY MOUTH 3 TIMES DAILY. 180 capsule 0   levothyroxine  (SYNTHROID ) 100 MCG tablet TAKE 1 TABLET (100 MCG TOTAL) BY MOUTH DAILY. LAST REFILL WITHOUT APPT 90 tablet 1   promethazine  (PHENERGAN ) 25 MG tablet TAKE 1 TABLET BY MOUTH EVERY 6 HOURS AS NEEDED FOR NAUSEA OR VOMITING. 30 tablet 2   cetirizine  (ZYRTEC ) 10 MG tablet TAKE 1 TABLET BY MOUTH EVERY DAY 90 tablet 3   fluorouracil  (EFUDEX ) 5 % cream Apply topically 2 (two) times daily. 40 g 1   meloxicam  (MOBIC ) 7.5 MG tablet Take 1 tablet (7.5 mg total) by mouth daily. 30 tablet 0   Multiple Vitamin  (MULTIVITAMIN) tablet Take 1 tablet by mouth daily.     Probiotic Product (PROBIOTIC-10) CHEW Chew by mouth.     Current Facility-Administered Medications  Medication Dose Route Frequency Provider Last Rate Last Admin   0.9 %  sodium chloride  infusion  500 mL Intravenous Once Demondre Aguas, Gordy HERO, MD        Allergies as of 05/09/2024 - Review Complete 05/09/2024  Allergen Reaction Noted   Sulfa antibiotics Nausea Only and Nausea And Vomiting 08/11/2013   Brimonidine tartrate Nausea And Vomiting     Family History  Problem Relation Age of Onset   Colon cancer Father    Cancer Father        skin   Colon polyps Sister    Colon polyps Sister    Colon polyps Sister    Colon polyps Sister    Cancer Maternal Grandfather    Cancer Paternal Grandfather        skin   Pancreatic cancer Neg Hx    Stomach cancer Neg Hx    Esophageal cancer Neg Hx    Rectal cancer Neg Hx     Social History   Socioeconomic History   Marital status: Widowed    Spouse name: Not on file   Number of children: Not on file   Years of education: Not on file   Highest education level: Some college, no degree  Occupational History   Occupation: retired  Tobacco Use   Smoking status: Never   Smokeless tobacco: Never   Tobacco comments:    Do not and never have smoked  Vaping Use   Vaping status: Never Used  Substance and Sexual Activity   Alcohol use: Yes    Comment: occasionally   Drug use: No   Sexual activity: Not Currently    Birth control/protection: Post-menopausal  Other Topics Concern   Not on file  Social History Narrative   Lives alone/2025 and 1 cat   Social Drivers of Health   Financial Resource Strain: Low Risk  (02/23/2024)   Overall Financial Resource Strain (CARDIA)    Difficulty of Paying Living Expenses: Not hard at all  Food Insecurity: No Food Insecurity (02/23/2024)   Hunger Vital Sign    Worried About Running Out of Food in the Last Year: Never true    Ran Out of Food in the  Last Year: Never true  Transportation Needs: No Transportation Needs (02/23/2024)   PRAPARE - Administrator, Civil Service (Medical): No    Lack of Transportation (Non-Medical): No  Physical Activity: Insufficiently Active (02/23/2024)   Exercise Vital Sign  Days of Exercise per Week: 3 days    Minutes of Exercise per Session: 30 min  Stress: No Stress Concern Present (02/23/2024)   [employer] of Occupational Health - Occupational Stress Questionnaire    Feeling of Stress: Not at all  Social Connections: Moderately Integrated (02/23/2024)   Social Connection and Isolation Panel    Frequency of Communication with Friends and Family: More than three times a week    Frequency of Social Gatherings with Friends and Family: More than three times a week    Attends Religious Services: More than 4 times per year    Active Member of [employer] or Organizations: Yes    Attends Banker Meetings: More than 4 times per year    Marital Status: Widowed  Intimate Partner Violence: Patient Unable To Answer (02/23/2024)   Humiliation, Afraid, Rape, and Kick questionnaire    Fear of Current or Ex-Partner: Patient unable to answer    Emotionally Abused: Patient unable to answer    Physically Abused: Patient unable to answer    Sexually Abused: Patient unable to answer    Physical Exam: Vital signs in last 24 hours: @BP  (!) 182/110   Pulse 84   Temp (!) 97 F (36.1 C) (Temporal)   Ht 5' 2 (1.575 m)   Wt 185 lb (83.9 kg)   SpO2 97%   BMI 33.84 kg/m  GEN: NAD EYE: Sclerae anicteric ENT: MMM CV: Non-tachycardic Pulm: CTA b/l GI: Soft, NT/ND NEURO:  Alert & Oriented x 3   Gordy Starch, MD [facility]  05/09/2024 8:35 AM

## 2024-05-10 ENCOUNTER — Telehealth: Payer: Self-pay | Admitting: Lactation Services

## 2024-05-10 NOTE — Telephone Encounter (Signed)
 No answer left voice mail

## 2024-05-11 ENCOUNTER — Ambulatory Visit: Payer: Self-pay | Admitting: Family Medicine

## 2024-05-11 ENCOUNTER — Ambulatory Visit: Admitting: Family Medicine

## 2024-05-11 VITALS — BP 144/82 | HR 78 | Temp 98.6°F | Ht 62.0 in | Wt 185.0 lb

## 2024-05-11 DIAGNOSIS — I10 Essential (primary) hypertension: Secondary | ICD-10-CM | POA: Diagnosis not present

## 2024-05-11 LAB — HEPATIC FUNCTION PANEL
ALT: 12 U/L (ref 0–35)
AST: 13 U/L (ref 0–37)
Albumin: 4 g/dL (ref 3.5–5.2)
Alkaline Phosphatase: 67 U/L (ref 39–117)
Bilirubin, Direct: 0.1 mg/dL (ref 0.0–0.3)
Total Bilirubin: 0.7 mg/dL (ref 0.2–1.2)
Total Protein: 6.2 g/dL (ref 6.0–8.3)

## 2024-05-11 LAB — CBC WITH DIFFERENTIAL/PLATELET
Basophils Absolute: 0.1 K/uL (ref 0.0–0.1)
Basophils Relative: 0.9 % (ref 0.0–3.0)
Eosinophils Absolute: 0.1 K/uL (ref 0.0–0.7)
Eosinophils Relative: 1 % (ref 0.0–5.0)
HCT: 44.7 % (ref 36.0–46.0)
Hemoglobin: 14.8 g/dL (ref 12.0–15.0)
Lymphocytes Relative: 28.5 % (ref 12.0–46.0)
Lymphs Abs: 1.6 K/uL (ref 0.7–4.0)
MCHC: 33.2 g/dL (ref 30.0–36.0)
MCV: 87.2 fl (ref 78.0–100.0)
Monocytes Absolute: 0.5 K/uL (ref 0.1–1.0)
Monocytes Relative: 8.5 % (ref 3.0–12.0)
Neutro Abs: 3.3 K/uL (ref 1.4–7.7)
Neutrophils Relative %: 61.1 % (ref 43.0–77.0)
Platelets: 173 K/uL (ref 150.0–400.0)
RBC: 5.13 Mil/uL — ABNORMAL HIGH (ref 3.87–5.11)
RDW: 13.5 % (ref 11.5–15.5)
WBC: 5.5 K/uL (ref 4.0–10.5)

## 2024-05-11 LAB — BASIC METABOLIC PANEL WITH GFR
BUN: 9 mg/dL (ref 6–23)
CO2: 32 meq/L (ref 19–32)
Calcium: 9.3 mg/dL (ref 8.4–10.5)
Chloride: 104 meq/L (ref 96–112)
Creatinine, Ser: 0.92 mg/dL (ref 0.40–1.20)
GFR: 61.63 mL/min (ref >60.00)
Glucose, Bld: 64 mg/dL — ABNORMAL LOW (ref 70–99)
Potassium: 3.4 meq/L — ABNORMAL LOW (ref 3.5–5.1)
Sodium: 145 meq/L (ref 135–145)

## 2024-05-11 LAB — LIPID PANEL
Cholesterol: 177 mg/dL (ref 0–200)
HDL: 51.9 mg/dL (ref >39.00)
LDL Cholesterol: 105 mg/dL — ABNORMAL HIGH (ref 0–99)
NonHDL: 125.45
Total CHOL/HDL Ratio: 3
Triglycerides: 103 mg/dL (ref 0.0–149.0)
VLDL: 20.6 mg/dL (ref 0.0–40.0)

## 2024-05-11 LAB — TSH: TSH: 13.96 u[IU]/mL — ABNORMAL HIGH (ref 0.35–5.50)

## 2024-05-11 LAB — SURGICAL PATHOLOGY

## 2024-05-11 MED ORDER — HYDROCHLOROTHIAZIDE 12.5 MG PO TABS: 12.5000 mg | ORAL_TABLET | Freq: Every day | ORAL | 3 refills | Status: AC

## 2024-05-11 NOTE — Patient Instructions (Addendum)
 Follow up in 1 month to recheck blood pressure We'll notify you of your lab results and make any changes if needed START the hydrochlorothiazide  daily Try and limit your salt intake and drink lots of water to improve your blood pressure Call with any questions or concerns Stay Safe!  Stay Healthy! Merry Christmas!!!

## 2024-05-11 NOTE — Progress Notes (Signed)
   Subjective:    Patient ID: Lynn [name], female    DOB: February 16, 1951, 73 y.o.   MRN: [id number]  HPI Elevated BP- Pt reports BP has been 'up and down' over the last few months.  Pt's BP was 132/92 at [facility] on 11[facility]13, 182/110 at colonoscopy on 12/2.  Going back farther, BP was 128/90 on 9/4 at [facility] and 142/90 on 7/10 at [facility].  Pt reports some swelling of hands/feet- particularly when hot.  No CP, SOB, HA's, visual changes, edema.   Review of Systems For ROS see HPI     Objective:   Physical Exam Vitals reviewed.  Constitutional:      General: She is not in acute distress.    Appearance: Normal appearance. She is well-developed. She is not ill-appearing.  HENT:     Head: Normocephalic and atraumatic.  Eyes:     Conjunctiva/sclera: Conjunctivae normal.     Pupils: Pupils are equal, round, and reactive to light.  Neck:     Thyroid : No thyromegaly.  Cardiovascular:     Rate and Rhythm: Normal rate and regular rhythm.     Pulses: Normal pulses.     Heart sounds: Normal heart sounds. No murmur heard. Pulmonary:     Effort: Pulmonary effort is normal. No respiratory distress.     Breath sounds: Normal breath sounds.  Abdominal:     General: There is no distension.     Palpations: Abdomen is soft.     Tenderness: There is no abdominal tenderness.  Musculoskeletal:     Cervical back: Normal range of motion and neck supple.     Right lower leg: No edema.     Left lower leg: No edema.  Lymphadenopathy:     Cervical: No cervical adenopathy.  Skin:    General: Skin is warm and dry.  Neurological:     General: No focal deficit present.     Mental Status: She is alert and oriented to person, place, and time.  Psychiatric:        Mood and Affect: Mood normal.        Behavior: Behavior normal.        Thought Content: Thought content normal.           Assessment & Plan:

## 2024-05-12 ENCOUNTER — Ambulatory Visit: Payer: Self-pay | Admitting: Internal Medicine

## 2024-05-12 MED ORDER — POTASSIUM CHLORIDE CRYS ER 20 MEQ PO TBCR: 20.0000 meq | EXTENDED_RELEASE_TABLET | Freq: Every day | ORAL | 3 refills | Status: AC

## 2024-05-17 ENCOUNTER — Other Ambulatory Visit

## 2024-06-07 NOTE — Assessment & Plan Note (Signed)
 Deteriorated.  BP is again elevated.  Has been up and down recently but given that she has had multiple elevated readings will start hydrochlorothiazide  12.5mg  daily.  Encouraged low sodium diet, increased water intake.  Will follow.

## 2024-06-09 ENCOUNTER — Other Ambulatory Visit: Payer: Self-pay | Admitting: Family Medicine

## 2024-06-12 ENCOUNTER — Ambulatory Visit: Admitting: Family Medicine

## 2024-06-21 ENCOUNTER — Ambulatory Visit: Admitting: Family Medicine

## 2024-06-22 ENCOUNTER — Ambulatory Visit: Admitting: Family Medicine

## 2024-06-29 ENCOUNTER — Ambulatory Visit: Admitting: Family Medicine

## 2024-07-05 ENCOUNTER — Ambulatory Visit: Admitting: Family Medicine

## 2024-07-13 ENCOUNTER — Other Ambulatory Visit: Payer: Self-pay | Admitting: Family Medicine

## 2024-07-13 DIAGNOSIS — R42 Dizziness and giddiness: Secondary | ICD-10-CM

## 2024-07-13 NOTE — Telephone Encounter (Signed)
 Requested Prescriptions   Pending Prescriptions Disp Refills   meclizine  (ANTIVERT ) 25 MG tablet [Pharmacy Med Name: MECLIZINE  25 MG TABLET] 21 tablet 0    Sig: TAKE 1 TABLET (25 MG TOTAL) BY MOUTH 3 (THREE) TIMES DAILY AS NEEDED FOR UP TO 7 DAYS FOR DIZZINESS.     Date of patient request: 07/13/24 Last office visit: 05/11/2024 Upcoming visit: Visit date not found Date of last refill:  Last refill amount: 21

## 2024-07-14 ENCOUNTER — Ambulatory Visit: Admitting: Family Medicine

## 2024-07-18 ENCOUNTER — Ambulatory Visit: Admitting: Family Medicine

## 2024-08-03 ENCOUNTER — Ambulatory Visit: Admitting: Family Medicine

## 2024-10-04 ENCOUNTER — Ambulatory Visit: Admitting: Dermatology
